# Patient Record
Sex: Male | Born: 1945
Health system: Southern US, Community
[De-identification: ages and names within clinical notes are randomized; demographics above are authoritative.]

## PROBLEM LIST (undated history)

## (undated) DIAGNOSIS — I10 Essential (primary) hypertension: Secondary | ICD-10-CM

## (undated) DIAGNOSIS — E039 Hypothyroidism, unspecified: Secondary | ICD-10-CM

## (undated) DIAGNOSIS — C61 Malignant neoplasm of prostate: Secondary | ICD-10-CM

## (undated) DIAGNOSIS — M1990 Unspecified osteoarthritis, unspecified site: Secondary | ICD-10-CM

## (undated) DIAGNOSIS — T7840XA Allergy, unspecified, initial encounter: Secondary | ICD-10-CM

## (undated) DIAGNOSIS — E079 Disorder of thyroid, unspecified: Secondary | ICD-10-CM

## (undated) DIAGNOSIS — E7800 Pure hypercholesterolemia, unspecified: Secondary | ICD-10-CM

## (undated) HISTORY — PX: ANKLE SURGERY: SHX546

## (undated) HISTORY — PX: HERNIA REPAIR: SHX51

## (undated) HISTORY — DX: Allergy, unspecified, initial encounter: T78.40XA

## (undated) HISTORY — DX: Hypothyroidism, unspecified: E03.9

## (undated) HISTORY — DX: Unspecified osteoarthritis, unspecified site: M19.90

## (undated) HISTORY — DX: Disorder of thyroid, unspecified: E07.9

## (undated) HISTORY — DX: Malignant neoplasm of prostate: C61

## (undated) HISTORY — PX: PROSTATECTOMY: SHX69

## (undated) HISTORY — PX: BACK SURGERY: SHX140

---

## 2010-10-15 ENCOUNTER — Ambulatory Visit (INDEPENDENT_AMBULATORY_CARE_PROVIDER_SITE_OTHER): Payer: Medicare PPO | Admitting: Endocrinology

## 2010-10-15 ENCOUNTER — Encounter: Payer: Self-pay | Admitting: Endocrinology

## 2010-10-15 DIAGNOSIS — E042 Nontoxic multinodular goiter: Secondary | ICD-10-CM

## 2010-10-15 DIAGNOSIS — E7800 Pure hypercholesterolemia, unspecified: Secondary | ICD-10-CM | POA: Insufficient documentation

## 2010-10-15 DIAGNOSIS — E0590 Thyrotoxicosis, unspecified without thyrotoxic crisis or storm: Secondary | ICD-10-CM

## 2010-10-15 DIAGNOSIS — M1990 Unspecified osteoarthritis, unspecified site: Secondary | ICD-10-CM

## 2010-10-15 DIAGNOSIS — E119 Type 2 diabetes mellitus without complications: Secondary | ICD-10-CM | POA: Insufficient documentation

## 2010-10-15 NOTE — Patient Instructions (Addendum)
Please sign a release of information for the thyroid ultrasound and thyroid blood test. Based on the results i will probably request for you a thyroid "scan" (a special, but easy and painless type of thyroid x ray).  It works like this: you go to the x-ray department of the hospital to swallow a pill, which contains a miniscule amount of radioactivity.  You will not notice any symptoms from this.  You will go back to the x-ray department the next day, to lie down in front of a camera.  The results of this will be sent to me.   Based on the results, i hope to order for you a treatment pill of radioactive iodine.  Although it is a larger amount of radioactivity, you will again notice no symptoms from this.  The pill is gone from your body in a few days (during which you should stay away from other people), but takes several months to work.  Therefore, please return here approximately 6 weeks after the treatment.  This treatment has been available for many years, and the only known side-effect is an underactive thyroid.  It is possible that i would eventually prescribe for you a thyroid hormone pill, which is very inexpensive.  You don't have to worry about side-effects of this thyroid hormone pill, because it is the same molecule your thyroid makes. (update:  i ordered scan)

## 2010-10-15 NOTE — Progress Notes (Signed)
  Subjective:    Patient ID: Duane Stevens, male    DOB: 1946-03-18, 65 y.o.   MRN: [id number]  HPI Pt had mri of the spine, and was incidentally noted to have a thyroid nodule.  Pt says he is unaware of ever having had a thyroid problem.  He has a few years of moderate pain at the neck, but no assoc swelling at the thyroid area. Past Medical History  Diagnosis Date  . Thyroid disease   . Prostate cancer     hx of  . Arthritis   . Diabetes mellitus   . Allergy     Past Surgical History  Procedure Date  . Hernia repair   . Back surgery   . Prostatectomy     History   Social History  . Marital Status: Married    Spouse Name: N/A    Number of Children: N/A  . Years of Education: N/A   Occupational History  . Retired    Social History Main Topics  . Smoking status: Former Games developer  . Smokeless tobacco: Not on file   Comment: quit 20-30 years ago  . Alcohol Use: No  . Drug Use: No  . Sexually Active: Not on file   Other Topics Concern  . Not on file   Social History Narrative  . No narrative on file  retired  No current outpatient prescriptions on file prior to visit.    Allergies  Allergen Reactions  . Bee Venom   . Oxycontin   . Penicillins     Family History  Problem Relation Age of Onset  . Cancer Other     FH of Lung Cancer  . Cancer Other     FH of Prostate Cancer  . Diabetes Other     Parent  . Heart disease Other     Parent  no goiter or other thyroid problem BP 104/64  Pulse 76  Temp(Src) 98.2 F (36.8 C) (Oral)  Ht 5\' 9"  (1.753 m)  Wt 206 lb (93.441 kg)  BMI 30.42 kg/m2  SpO2 97%    Review of Systems denies weight loss, headache, hoarseness, double vision, palpitations, sob, diarrhea, polyuria, myalgias, excessive diaphoresis, numbness, tremor, anxiety, hypoglycemia, and easy bruising.  He has chronic rhinorrhea.    Objective:   Physical Exam VS: see vs page GEN: no distress HEAD: head: no deformity eyes: no periorbital  swelling, no proptosis external nose and ears are normal mouth: no lesion seen NECK: there is an easily palpable, 2 cm right thyroid nodule CHEST WALL: no deformity CV: reg rate and rhythm, no murmur ABD: abdomen is soft, nontender.  no hepatosplenomegaly.  not distended.  no hernia MUSCULOSKELETAL: muscle bulk and strength are grossly normal.  no obvious joint swelling.  gait is normal and steady EXTEMITIES: no deformity.  no ulcer on the feet.  feet are of normal color and temp.  no edema PULSES: dorsalis pedis intact bilat.  no carotid bruit NEURO:  cn 2-12 grossly intact.   readily moves all 4's.  sensation is intact to touch on the feet SKIN:  Normal texture and temperature.  No rash or suspicious lesion is visible.   NODES:  None palpable at the neck. PSYCH: alert, oriented x3.  Does not appear anxious nor depressed.     Thyroid ultrasound: multinodular goiter, with dominant right nodule  Assessment & Plan:  Multinodular goiter, which is usually hereditary Mild hyperthyroidism, due to the goiter Neck pain, not related to the thyroid.

## 2010-11-04 ENCOUNTER — Telehealth: Payer: Self-pay | Admitting: *Deleted

## 2010-11-04 NOTE — Telephone Encounter (Signed)
Patient requesting results of thyroid scan.  LMOM that we are not showing results in our system as of yet but will clarify with MD tomorrow.

## 2010-11-05 NOTE — Telephone Encounter (Signed)
i called pt 11/05/10.  Come in for bx

## 2010-11-11 ENCOUNTER — Encounter: Payer: Self-pay | Admitting: Endocrinology

## 2010-11-19 ENCOUNTER — Ambulatory Visit (INDEPENDENT_AMBULATORY_CARE_PROVIDER_SITE_OTHER): Payer: Medicare PPO | Admitting: Endocrinology

## 2010-11-19 ENCOUNTER — Encounter: Payer: Self-pay | Admitting: Endocrinology

## 2010-11-19 ENCOUNTER — Other Ambulatory Visit (HOSPITAL_COMMUNITY)
Admission: RE | Admit: 2010-11-19 | Discharge: 2010-11-19 | Disposition: A | Discharge status: Home / Self Care | Payer: Medicare PPO | Source: Ambulatory Visit | Attending: Endocrinology | Admitting: Endocrinology

## 2010-11-19 VITALS — BP 122/74 | HR 77 | Temp 97.9°F | Ht 69.0 in | Wt 205.0 lb

## 2010-11-19 DIAGNOSIS — E041 Nontoxic single thyroid nodule: Secondary | ICD-10-CM | POA: Insufficient documentation

## 2010-11-19 DIAGNOSIS — E042 Nontoxic multinodular goiter: Secondary | ICD-10-CM

## 2010-11-19 NOTE — Patient Instructions (Addendum)
please call [phone] to hear your biopsy result.  It will be available in 2-7 days.  You will be prompted to enter the 9-digit "MRN" number that appears at the top left of this page, followed by #.  Then you will hear the message. If the result is benign (as expected), i'll order the radioactive iodine treamtment for you. Please return here about 6-8 weeks later.

## 2010-11-23 NOTE — Progress Notes (Signed)
  Subjective:    Patient ID: Duane Stevens, male    DOB: 10/27/1945, 65 y.o.   MRN: [id number]  HPI thyroid needle bx: consent obtained, signed form on chart local: xylocaine 2% prep: betadine 3 bxs are done with 27g needles no complications    Review of Systems     Objective:   Physical Exam        Assessment & Plan:

## 2010-12-12 ENCOUNTER — Telehealth: Payer: Self-pay

## 2010-12-12 DIAGNOSIS — E0590 Thyrotoxicosis, unspecified without thyrotoxic crisis or storm: Secondary | ICD-10-CM

## 2010-12-12 NOTE — Telephone Encounter (Signed)
Pt advised that [facility] will call regarding therapy and he is to return to clinic 6 weeks after treatment.

## 2010-12-12 NOTE — Telephone Encounter (Signed)
Pt called stating that since the pathology results of his thyroid came back normal he has not heard back form our office. Pt states he was advise that he would be be schedule for radioactive iodine but has yet to be contacted, please advise.

## 2010-12-12 NOTE — Telephone Encounter (Signed)
i ordered Ret 6 weeks later 

## 2011-01-01 ENCOUNTER — Other Ambulatory Visit: Payer: Self-pay | Admitting: Endocrinology

## 2011-01-01 DIAGNOSIS — E0590 Thyrotoxicosis, unspecified without thyrotoxic crisis or storm: Secondary | ICD-10-CM

## 2011-01-21 ENCOUNTER — Encounter: Payer: Self-pay | Admitting: Endocrinology

## 2011-02-24 ENCOUNTER — Encounter: Payer: Self-pay | Admitting: Endocrinology

## 2011-02-24 ENCOUNTER — Ambulatory Visit (INDEPENDENT_AMBULATORY_CARE_PROVIDER_SITE_OTHER): Payer: Medicare PPO | Admitting: Endocrinology

## 2011-02-24 ENCOUNTER — Other Ambulatory Visit (INDEPENDENT_AMBULATORY_CARE_PROVIDER_SITE_OTHER): Payer: Medicare PPO

## 2011-02-24 VITALS — BP 118/70 | HR 96 | Temp 98.1°F | Ht 68.0 in | Wt 203.0 lb

## 2011-02-24 DIAGNOSIS — E0590 Thyrotoxicosis, unspecified without thyrotoxic crisis or storm: Secondary | ICD-10-CM

## 2011-02-24 LAB — TSH: TSH: 0.24 u[IU]/mL — ABNORMAL LOW (ref 0.35–5.50)

## 2011-02-24 NOTE — Progress Notes (Signed)
  Subjective:    Patient ID: Duane Stevens, male    DOB: March 19, 1946, 65 y.o.   MRN: [id number]  HPI Pt is 6 weeks /p i-131 rx, due to multinodular goiter.  pt states he feels no different, and well in general. Past Medical History  Diagnosis Date  . Thyroid disease   . Prostate cancer     hx of  . Arthritis   . Diabetes mellitus   . Allergy     Past Surgical History  Procedure Date  . Hernia repair   . Back surgery   . Prostatectomy     History   Social History  . Marital Status: Married    Spouse Name: N/A    Number of Children: N/A  . Years of Education: N/A   Occupational History  . Retired    Social History Main Topics  . Smoking status: Former Games developer  . Smokeless tobacco: Not on file   Comment: quit 20-30 years ago  . Alcohol Use: No  . Drug Use: No  . Sexually Active: Not on file   Other Topics Concern  . Not on file   Social History Narrative  . No narrative on file    Current Outpatient Prescriptions on File Prior to Visit  Medication Sig Dispense Refill  . aspirin 81 MG tablet Take 81 mg by mouth daily.        . Cholecalciferol (VITAMIN D3) 1000 UNITS CAPS Take 1 capsule by mouth daily.        . ETODOLAC PO Take 1 tablet by mouth 2 (two) times daily as needed.        . Glucosamine-Chondroitin-Vit D3 1500-1200-800 MG-MG-UNIT PACK Take 1 capsule by mouth 2 (two) times daily.        . metFORMIN (GLUCOPHAGE-XR) 500 MG 24 hr tablet Take 500 mg by mouth 2 (two) times daily.        . simvastatin (ZOCOR) 20 MG tablet Take 20 mg by mouth daily.          Allergies  Allergen Reactions  . Bee Venom   . Oxycontin   . Penicillins     Family History  Problem Relation Age of Onset  . Cancer Other     FH of Lung Cancer  . Cancer Other     FH of Prostate Cancer  . Diabetes Other     Parent  . Heart disease Other     Parent    BP 118/70  Pulse 96  Temp(Src) 98.1 F (36.7 C) (Oral)  Ht 5\' 8"  (1.727 m)  Wt 203 lb (92.08 kg)  BMI 30.87 kg/m2   SpO2 95%   Review of Systems Denies weight change    Objective:   Physical Exam VITAL SIGNS:  See vs page GENERAL: no distress Neck: no change in the 2 cm right thyroid thyroid nodule.     Lab Results  Component Value Date   TSH 0.24* 02/24/2011      Assessment & Plan:  Hyperthyroidism due to multinodular goiter, not better yet. Multinodular goiter, not better yet.

## 2011-02-24 NOTE — Patient Instructions (Addendum)
blood tests are being requested for you today.  please call [phone] to hear your test results.  You will be prompted to enter the 9-digit "MRN" number that appears at the top left of this page, followed by #.  Then you will hear the message. Please see dr burdine in approx 6 weeks, to recheck the thyroid. Please come back for a follow-up appointment here in approx 4 months. (update: i left message on phone-tree:  Not better yet).

## 2011-05-08 ENCOUNTER — Other Ambulatory Visit (INDEPENDENT_AMBULATORY_CARE_PROVIDER_SITE_OTHER): Payer: Medicare Other

## 2011-05-08 ENCOUNTER — Encounter: Payer: Self-pay | Admitting: Endocrinology

## 2011-05-08 ENCOUNTER — Ambulatory Visit (INDEPENDENT_AMBULATORY_CARE_PROVIDER_SITE_OTHER): Payer: Medicare Other | Admitting: Endocrinology

## 2011-05-08 VITALS — BP 128/74 | HR 85 | Temp 97.3°F | Ht 68.0 in | Wt 206.7 lb

## 2011-05-08 DIAGNOSIS — E0590 Thyrotoxicosis, unspecified without thyrotoxic crisis or storm: Secondary | ICD-10-CM

## 2011-05-08 NOTE — Progress Notes (Signed)
  Subjective:    Patient ID: Duane Stevens, male    DOB: Aug 16, 1945, 66 y.o.   MRN: [id number]  HPI  Pt is 3 1/2 months /p i-131 rx, due to multinodular goiter.  pt states he feels well, except for fatigue. Past Medical History  Diagnosis Date  . Thyroid disease   . Prostate cancer     hx of  . Arthritis   . Diabetes mellitus   . Allergy     Past Surgical History  Procedure Date  . Hernia repair   . Back surgery   . Prostatectomy     History   Social History  . Marital Status: Married    Spouse Name: N/A    Number of Children: N/A  . Years of Education: N/A   Occupational History  . Retired    Social History Main Topics  . Smoking status: Former Games developer  . Smokeless tobacco: Not on file   Comment: quit 20-30 years ago  . Alcohol Use: No  . Drug Use: No  . Sexually Active: Not on file   Other Topics Concern  . Not on file   Social History Narrative  . No narrative on file    Current Outpatient Prescriptions on File Prior to Visit  Medication Sig Dispense Refill  . aspirin 81 MG tablet Take 81 mg by mouth daily.        . Cholecalciferol (VITAMIN D3) 1000 UNITS CAPS Take 1 capsule by mouth daily.        . ETODOLAC PO Take 1 tablet by mouth 2 (two) times daily as needed.        . Glucosamine-Chondroitin-Vit D3 1500-1200-800 MG-MG-UNIT PACK Take 1 capsule by mouth 2 (two) times daily.        . metFORMIN (GLUCOPHAGE-XR) 500 MG 24 hr tablet Take 500 mg by mouth 2 (two) times daily.        . simvastatin (ZOCOR) 20 MG tablet Take 20 mg by mouth daily.          Allergies  Allergen Reactions  . Bee Venom   . Oxycontin   . Penicillins     Family History  Problem Relation Age of Onset  . Cancer Other     FH of Lung Cancer  . Cancer Other     FH of Prostate Cancer  . Diabetes Other     Parent  . Heart disease Other     Parent   There were no vitals taken for this visit.  Review of Systems Denies weight change.    Objective:   Physical Exam VITAL  SIGNS:  See vs page GENERAL: no distress Neck: no change in the 2 cm right thyroid thyroid nodule.    Lab Results  Component Value Date   TSH 0.24* 02/24/2011      Assessment & Plan:  Hyperthyroidism due to multinodular goiter, not better yet. Multinodular goiter, not better yet.

## 2011-05-08 NOTE — Patient Instructions (Addendum)
blood tests are being requested for you today.  please call [phone] to hear your test results.  You will be prompted to enter the 9-digit "MRN" number that appears at the top left of this page, followed by #.  Then you will hear the message. Please come back for a follow-up appointment in 3 months. (update: i left message on phone-tree:  rx as we discussed).

## 2011-08-07 ENCOUNTER — Other Ambulatory Visit (INDEPENDENT_AMBULATORY_CARE_PROVIDER_SITE_OTHER): Payer: Medicare Other

## 2011-08-07 ENCOUNTER — Encounter: Payer: Self-pay | Admitting: Endocrinology

## 2011-08-07 ENCOUNTER — Ambulatory Visit (INDEPENDENT_AMBULATORY_CARE_PROVIDER_SITE_OTHER): Payer: Medicare Other | Admitting: Endocrinology

## 2011-08-07 VITALS — BP 122/70 | HR 84 | Temp 97.4°F | Ht 68.0 in | Wt 203.0 lb

## 2011-08-07 DIAGNOSIS — E0590 Thyrotoxicosis, unspecified without thyrotoxic crisis or storm: Secondary | ICD-10-CM

## 2011-08-07 LAB — TSH: TSH: 3.94 u[IU]/mL (ref 0.35–5.50)

## 2011-08-07 NOTE — Patient Instructions (Addendum)
blood tests are being requested for you today.  You will receive a letter with results. Please come back for a follow-up appointment in 1 year. 

## 2011-08-07 NOTE — Progress Notes (Signed)
  Subjective:    Patient ID: Duane Stevens, male    DOB: 19-Feb-1946, 66 y.o.   MRN: [id number]  HPI Pt is 6 1/2 months s/p i-131 rx, due to multinodular goiter.  pt states he feels well, except for drowsiness. Past Medical History  Diagnosis Date  . Thyroid disease   . Prostate cancer     hx of  . Arthritis   . Diabetes mellitus   . Allergy     Past Surgical History  Procedure Date  . Hernia repair   . Back surgery   . Prostatectomy     History   Social History  . Marital Status: Married    Spouse Name: N/A    Number of Children: N/A  . Years of Education: N/A   Occupational History  . Retired    Social History Main Topics  . Smoking status: Former Games developer  . Smokeless tobacco: Not on file   Comment: quit 20-30 years ago  . Alcohol Use: No  . Drug Use: No  . Sexually Active: Not on file   Other Topics Concern  . Not on file   Social History Narrative  . No narrative on file    Current Outpatient Prescriptions on File Prior to Visit  Medication Sig Dispense Refill  . aspirin 81 MG tablet Take 81 mg by mouth daily.        . Cholecalciferol (VITAMIN D3) 1000 UNITS CAPS Take 1 capsule by mouth daily.        . ETODOLAC PO Take 0.5 tablets by mouth 2 (two) times daily as needed.       . Glucosamine-Chondroitin-Vit D3 1500-1200-800 MG-MG-UNIT PACK Take 1 capsule by mouth 2 (two) times daily.        . metFORMIN (GLUCOPHAGE-XR) 500 MG 24 hr tablet Take 500 mg by mouth 2 (two) times daily.        . simvastatin (ZOCOR) 20 MG tablet Take 20 mg by mouth daily.          Allergies  Allergen Reactions  . Bee Venom   . Oxycontin   . Penicillins     Family History  Problem Relation Age of Onset  . Cancer Other     FH of Lung Cancer  . Cancer Other     FH of Prostate Cancer  . Diabetes Other     Parent  . Heart disease Other     Parent    BP 122/70  Pulse 84  Temp(Src) 97.4 F (36.3 C) (Oral)  Ht 5\' 8"  (1.727 m)  Wt 203 lb (92.08 kg)  BMI 30.87 kg/m2   SpO2 97%    Review of Systems Denies weight gain    Objective:   Physical Exam VITAL SIGNS:  See vs page GENERAL: no distress Neck: no change in the 2 cm right thyroid thyroid nodule.     Lab Results  Component Value Date   TSH 3.94 08/07/2011      Assessment & Plan:  Hyperthyroidism, resolved with i-131 rx Multinodular goiter, clinically unchanged

## 2011-08-08 ENCOUNTER — Telehealth: Payer: Self-pay | Admitting: *Deleted

## 2011-08-08 NOTE — Telephone Encounter (Signed)
Called pt to inform of thyroid results, pt informed (Letter also mailed to pt).

## 2012-04-07 ENCOUNTER — Ambulatory Visit (INDEPENDENT_AMBULATORY_CARE_PROVIDER_SITE_OTHER): Payer: Medicare Other | Admitting: Endocrinology

## 2012-04-07 ENCOUNTER — Encounter: Payer: Self-pay | Admitting: Endocrinology

## 2012-04-07 VITALS — BP 142/78 | HR 89 | Temp 98.6°F | Wt 200.0 lb

## 2012-04-07 DIAGNOSIS — E0590 Thyrotoxicosis, unspecified without thyrotoxic crisis or storm: Secondary | ICD-10-CM

## 2012-04-07 NOTE — Patient Instructions (Addendum)
blood tests are being requested for you today.  We'll contact you with results. Please return in 1 year. most of the time, a "lumpy thyroid" will eventually become overactive again.  this is usually a slow process, which could take many years.

## 2012-04-07 NOTE — Progress Notes (Signed)
  Subjective:    Patient ID: Duane Stevens, male    DOB: Aug 07, 1945, 66 y.o.   MRN: [id number]  HPI Pt is 14 months s/p i-131 rx, due to multinodular goiter.  He developed euthyroidism, on no rx.  pt states he feels well in general.   Past Medical History  Diagnosis Date  . Thyroid disease   . Prostate cancer     hx of  . Arthritis   . Diabetes mellitus   . Allergy     Past Surgical History  Procedure Date  . Hernia repair   . Back surgery   . Prostatectomy     History   Social History  . Marital Status: Married    Spouse Name: N/A    Number of Children: N/A  . Years of Education: N/A   Occupational History  . Retired    Social History Main Topics  . Smoking status: Former Games developer  . Smokeless tobacco: Not on file     Comment: quit 20-30 years ago  . Alcohol Use: No  . Drug Use: No  . Sexually Active: Not on file   Other Topics Concern  . Not on file   Social History Narrative  . No narrative on file    Current Outpatient Prescriptions on File Prior to Visit  Medication Sig Dispense Refill  . aspirin 81 MG tablet Take 81 mg by mouth daily.        . Cholecalciferol (VITAMIN D3) 1000 UNITS CAPS Take 1 capsule by mouth daily.        . ETODOLAC PO Take 0.5 tablets by mouth 2 (two) times daily as needed.       . Glucosamine-Chondroitin-Vit D3 1500-1200-800 MG-MG-UNIT PACK Take 1 capsule by mouth 2 (two) times daily.        . metFORMIN (GLUCOPHAGE-XR) 500 MG 24 hr tablet Take 500 mg by mouth 2 (two) times daily.        . simvastatin (ZOCOR) 20 MG tablet Take 20 mg by mouth daily.          Allergies  Allergen Reactions  . Bee Venom   . Oxycodone Hcl Er   . Penicillins     Family History  Problem Relation Age of Onset  . Cancer Other     FH of Lung Cancer  . Cancer Other     FH of Prostate Cancer  . Diabetes Other     Parent  . Heart disease Other     Parent    BP 142/78  Pulse 89  Temp 98.6 F (37 C) (Oral)  Wt 200 lb (90.719 kg)  SpO2  97%  Review of Systems Denies weight gain    Objective:   Physical Exam VITAL SIGNS:  See vs page GENERAL: no distress Neck: no change in the 2 cm right thyroid thyroid nodule.    Lab Results  Component Value Date   TSH 3.226 04/07/2012   Assessment & Plan:  Hyperthyroidism, resolved with i-131 rx. However, he is at risk for recurrence of hyperthyroidism Multinodular goiter, clinically unchanged

## 2013-09-21 ENCOUNTER — Ambulatory Visit (INDEPENDENT_AMBULATORY_CARE_PROVIDER_SITE_OTHER): Payer: Medicare HMO | Admitting: Endocrinology

## 2013-09-21 ENCOUNTER — Encounter: Payer: Self-pay | Admitting: Endocrinology

## 2013-09-21 VITALS — BP 120/70 | HR 82 | Temp 98.1°F | Ht 68.0 in | Wt 210.0 lb

## 2013-09-21 DIAGNOSIS — E042 Nontoxic multinodular goiter: Secondary | ICD-10-CM

## 2013-09-21 NOTE — Patient Instructions (Signed)
This is a borderline call, between taking a small thyroid hormone pill, or just rechecking the blood test in 3-6 months.   For now, let's plan to just have it rechecked at Dr Burdine's office in 3-6 months.   most of the time, a "lumpy thyroid" will eventually become overactive again.  this is usually a slow process, happening over the span of many years. Please come back for a follow-up appointment in 6 months.

## 2013-09-21 NOTE — Progress Notes (Signed)
Subjective:    Patient ID: Duane Stevens, male    DOB: December 24, 1945, 68 y.o.   MRN: [id number]  HPI Pt returns for f/u of multinodular goiter (he had I-131 rx for associated hyperthyroidism in 2012; [place] prior to rx showed all nodules < 10 mm; he developed euthyroidism, on no rx).   pt states he feels well in general.  He does not notice the goiter.   Past Medical History  Diagnosis Date  . Thyroid disease   . Prostate cancer     hx of  . Arthritis   . Diabetes mellitus   . Allergy     Past Surgical History  Procedure Laterality Date  . Hernia repair    . Back surgery    . Prostatectomy      History   Social History  . Marital Status: Married    Spouse Name: N/A    Number of Children: N/A  . Years of Education: N/A   Occupational History  . Retired    Social History Main Topics  . Smoking status: Former Research scientist (life sciences)  . Smokeless tobacco: Not on file     Comment: quit 20-30 years ago  . Alcohol Use: No  . Drug Use: No  . Sexual Activity: Not on file   Other Topics Concern  . Not on file   Social History Narrative  . No narrative on file    Current Outpatient Prescriptions on File Prior to Visit  Medication Sig Dispense Refill  . aspirin 81 MG tablet Take 81 mg by mouth daily.        . Cholecalciferol (VITAMIN D3) 1000 UNITS CAPS Take 1 capsule by mouth daily.        . ETODOLAC PO Take 0.5 tablets by mouth 2 (two) times daily as needed.       . Glucosamine-Chondroitin-Vit D3 1500-1200-800 MG-MG-UNIT PACK Take 1 capsule by mouth 2 (two) times daily.        . metFORMIN (GLUCOPHAGE-XR) 500 MG 24 hr tablet Take 500 mg by mouth 2 (two) times daily.        . simvastatin (ZOCOR) 20 MG tablet Take 20 mg by mouth daily.         No current facility-administered medications on file prior to visit.    Allergies  Allergen Reactions  . Bee Venom   . Oxycodone Hcl   . Penicillins     Family History  Problem Relation Age of Onset  . Cancer Other     FH of Lung Cancer    . Cancer Other     FH of Prostate Cancer  . Diabetes Other     Parent  . Heart disease Other     Parent    BP 120/70  Pulse 82  Temp(Src) 98.1 F (36.7 C) (Oral)  Ht 5\' 8"  (1.727 m)  Wt 210 lb (95.255 kg)  BMI 31.94 kg/m2  SpO2 92%  Review of Systems Denies weight change and dysphagia.      Objective:   Physical Exam VITAL SIGNS:  See vs page.   GENERAL: no distress.  Neck: 2 cm right thyroid thyroid nodule is again noted.     outside test results are reviewed: TSH=7 i reviewed the [place] report from [facility] in 2012.     Assessment & Plan:  Multinodular goiter: clinically unchanged.   Pt is advised to have a repeat US.   Post-I-131 hypothyroidism, new.   Patient is advised the following: Patient Instructions  This  is a borderline call, between taking a small thyroid hormone pill, or just rechecking the blood test in 3-6 months.   For now, let's plan to just have it rechecked at Dr Burdine's office in 3-6 months.   most of the time, a "lumpy thyroid" will eventually become overactive again.  this is usually a slow process, happening over the span of many years. Please come back for a follow-up appointment in 6 months.

## 2013-09-22 ENCOUNTER — Telehealth: Payer: Self-pay | Admitting: Endocrinology

## 2013-09-22 NOTE — Telephone Encounter (Signed)
please call patient: i reviewed your records.  Is it ok to do a follow-up ultrasound? i have ordered, to be done in [place].

## 2013-09-23 NOTE — Telephone Encounter (Signed)
Noted, pt's wife informed.

## 2013-10-05 ENCOUNTER — Ambulatory Visit
Admission: RE | Admit: 2013-10-05 | Discharge: 2013-10-05 | Disposition: A | Discharge status: Home / Self Care | Payer: Medicare HMO | Source: Ambulatory Visit | Attending: Endocrinology | Admitting: Endocrinology

## 2013-10-05 DIAGNOSIS — E042 Nontoxic multinodular goiter: Secondary | ICD-10-CM

## 2013-10-05 NOTE — Progress Notes (Signed)
   Subjective:    Patient ID: Duane Stevens, male    DOB: 1945-09-07, 68 y.o.   MRN: [id number]  HPI Correction: [place] prior to I-131 rx showed 3.5 cm right lobe mass, but all other nodules were small.   Review of Systems     Objective:   Physical Exam        Assessment & Plan:

## 2013-11-07 ENCOUNTER — Telehealth: Payer: Self-pay

## 2013-11-07 NOTE — Telephone Encounter (Signed)
Ov next available please

## 2013-11-08 NOTE — Telephone Encounter (Signed)
Pt scheduled for 12/01/2013.

## 2013-12-01 ENCOUNTER — Encounter: Payer: Self-pay | Admitting: Endocrinology

## 2013-12-01 ENCOUNTER — Ambulatory Visit (INDEPENDENT_AMBULATORY_CARE_PROVIDER_SITE_OTHER): Payer: Medicare HMO | Admitting: Endocrinology

## 2013-12-01 VITALS — BP 126/74 | HR 77 | Temp 97.7°F | Ht 68.0 in | Wt 205.0 lb

## 2013-12-01 DIAGNOSIS — E042 Nontoxic multinodular goiter: Secondary | ICD-10-CM

## 2013-12-01 LAB — T4, FREE: FREE T4: 0.6 ng/dL (ref 0.60–1.60)

## 2013-12-01 LAB — TSH: TSH: 3.4 u[IU]/mL (ref 0.35–4.50)

## 2013-12-01 NOTE — Patient Instructions (Addendum)
blood tests are being requested for you today.  We'll contact you with results.    most of the time, a "lumpy thyroid" will eventually become overactive again.  this is usually a slow process, happening over the span of many years. Please come back for a follow-up appointment in 6 months.

## 2013-12-01 NOTE — Progress Notes (Signed)
Subjective:    Patient ID: Duane Stevens, male    DOB: 03-Jan-1946, 68 y.o.   MRN: [id number]  HPI Pt returns for f/u of multinodular goiter (he had I-131 rx for associated hyperthyroidism in 2012; [place] prior to rx showed all nodules < 10 mm; he developed euthyroidism, on no rx).   pt states he feels well in general.  He does not notice the goiter.  In early 2015, TSH was slightly high.   Past Medical History  Diagnosis Date  . Thyroid disease   . Prostate cancer     hx of  . Arthritis   . Diabetes mellitus   . Allergy     Past Surgical History  Procedure Laterality Date  . Hernia repair    . Back surgery    . Prostatectomy      History   Social History  . Marital Status: Married    Spouse Name: N/A    Number of Children: N/A  . Years of Education: N/A   Occupational History  . Retired    Social History Main Topics  . Smoking status: Former Research scientist (life sciences)  . Smokeless tobacco: Not on file     Comment: quit 20-30 years ago  . Alcohol Use: No  . Drug Use: No  . Sexual Activity: Not on file   Other Topics Concern  . Not on file   Social History Narrative  . No narrative on file    Current Outpatient Prescriptions on File Prior to Visit  Medication Sig Dispense Refill  . aspirin 81 MG tablet Take 81 mg by mouth daily.        . Cholecalciferol (VITAMIN D3) 1000 UNITS CAPS Take 1 capsule by mouth daily.        . ETODOLAC PO Take 0.5 tablets by mouth 2 (two) times daily as needed.       Marland Kitchen glipiZIDE (GLUCOTROL) 10 MG tablet       . Glucosamine-Chondroitin-Vit D3 1500-1200-800 MG-MG-UNIT PACK Take 1 capsule by mouth 2 (two) times daily.        Marland Kitchen losartan (COZAAR) 25 MG tablet       . metFORMIN (GLUCOPHAGE-XR) 500 MG 24 hr tablet Take 500 mg by mouth 2 (two) times daily.        . simvastatin (ZOCOR) 20 MG tablet Take 20 mg by mouth daily.         No current facility-administered medications on file prior to visit.    Allergies  Allergen Reactions  . Bee Venom   .  Oxycodone Hcl   . Penicillins     Family History  Problem Relation Age of Onset  . Cancer Other     FH of Lung Cancer  . Cancer Other     FH of Prostate Cancer  . Diabetes Other     Parent  . Heart disease Other     Parent    BP 126/74  Pulse 77  Temp(Src) 97.7 F (36.5 C) (Oral)  Ht 5\' 8"  (1.727 m)  Wt 205 lb (92.987 kg)  BMI 31.18 kg/m2  SpO2 98%   Review of Systems Denies weight change.    Objective:   Physical Exam VITAL SIGNS:  See vs page GENERAL: no distress Neck: thyroid is normal, except for slight right lobe enlargement.  No palpable nodule.   Lab Results  Component Value Date   TSH 3.40 12/01/2013       Assessment & Plan:  Mild hypothyroidism, spontaneously resolved.  However,  he is at risk for recurrent abnormal abnormal thyroid function. Multinodular goiter, clinically unchanged.   Patient is advised the following: Patient Instructions  blood tests are being requested for you today.  We'll contact you with results.    most of the time, a "lumpy thyroid" will eventually become overactive again.  this is usually a slow process, happening over the span of many years. Please come back for a follow-up appointment in 6 months.

## 2014-06-05 ENCOUNTER — Ambulatory Visit: Payer: Medicare HMO | Admitting: Endocrinology

## 2014-06-06 ENCOUNTER — Ambulatory Visit: Payer: Medicare HMO | Admitting: Endocrinology

## 2014-06-26 ENCOUNTER — Ambulatory Visit (INDEPENDENT_AMBULATORY_CARE_PROVIDER_SITE_OTHER): Payer: Medicare HMO | Admitting: Endocrinology

## 2014-06-26 VITALS — BP 118/78 | HR 94 | Temp 98.1°F | Wt 206.2 lb

## 2014-06-26 DIAGNOSIS — E0590 Thyrotoxicosis, unspecified without thyrotoxic crisis or storm: Secondary | ICD-10-CM

## 2014-06-26 LAB — TSH: TSH: 5.64 u[IU]/mL — ABNORMAL HIGH (ref 0.35–4.50)

## 2014-06-26 NOTE — Progress Notes (Signed)
Pre visit review using our clinic review tool, if applicable. No additional management support is needed unless otherwise documented below in the visit note. 

## 2014-06-26 NOTE — Patient Instructions (Addendum)
blood tests are being requested for you today.  We'll contact you with results.    most of the time, a "lumpy thyroid" will eventually become overactive again.  this is usually a slow process, happening over the span of many years. Please come back for a follow-up appointment in 1 year.

## 2014-06-26 NOTE — Progress Notes (Signed)
Subjective:    Patient ID: Duane Stevens, male    DOB: [birth year], 69 y.o.   MRN: [id number]  HPI Pt returns for f/u of multinodular goiter (he had I-131 rx for associated hyperthyroidism in 2012; he developed euthyroidism, on no rx; f/u US in mid-2015 was unchanged ).   pt states he feels well in general.  He does not notice the goiter.   Past Medical History  Diagnosis Date  . Thyroid disease   . Prostate cancer     hx of  . Arthritis   . Diabetes mellitus   . Allergy     Past Surgical History  Procedure Laterality Date  . Hernia repair    . Back surgery    . Prostatectomy      History   Social History  . Marital Status: Married    Spouse Name: N/A  . Number of Children: N/A  . Years of Education: N/A   Occupational History  . Retired    Social History Main Topics  . Smoking status: Former Research scientist (life sciences)  . Smokeless tobacco: Not on file     Comment: quit 20-30 years ago  . Alcohol Use: No  . Drug Use: No  . Sexual Activity: Not on file   Other Topics Concern  . Not on file   Social History Narrative  . No narrative on file    Current Outpatient Prescriptions on File Prior to Visit  Medication Sig Dispense Refill  . aspirin 81 MG tablet Take 81 mg by mouth daily.      . Cholecalciferol (VITAMIN D3) 1000 UNITS CAPS Take 1 capsule by mouth daily.      . ETODOLAC PO Take 0.5 tablets by mouth 2 (two) times daily as needed.     Marland Kitchen glipiZIDE (GLUCOTROL) 10 MG tablet     . Glucosamine-Chondroitin-Vit D3 1500-1200-800 MG-MG-UNIT PACK Take 1 capsule by mouth 2 (two) times daily.      Marland Kitchen losartan (COZAAR) 25 MG tablet     . metFORMIN (GLUCOPHAGE-XR) 500 MG 24 hr tablet Take 500 mg by mouth 2 (two) times daily.      . simvastatin (ZOCOR) 20 MG tablet Take 20 mg by mouth daily.       No current facility-administered medications on file prior to visit.    Allergies  Allergen Reactions  . Bee Venom   . Oxycodone Hcl   . Penicillins     Family History  Problem  Relation Age of Onset  . Cancer Other     FH of Lung Cancer  . Cancer Other     FH of Prostate Cancer  . Diabetes Other     Parent  . Heart disease Other     Parent    BP 118/78 mmHg  Pulse 94  Temp(Src) 98.1 F (36.7 C) (Oral)  Wt 206 lb 3.2 oz (93.532 kg)  SpO2 97%  Review of Systems He has gained a few lbs.    Objective:   Physical Exam VITAL SIGNS:  See vs page.  GENERAL: no distress. Neck: right thyroid mass is again noted.  No palpable nodule.   Lab Results  Component Value Date   TSH 5.64* 06/26/2014       Assessment & Plan:  Post-I-131 hypothyroidism: slightly worse   Patient is advised the following: Patient Instructions  blood tests are being requested for you today.  We'll contact you with results.    most of the time, a "lumpy thyroid" will eventually become  overactive again.  this is usually a slow process, happening over the span of many years. Please come back for a follow-up appointment in 1 year.

## 2015-07-23 DIAGNOSIS — E049 Nontoxic goiter, unspecified: Secondary | ICD-10-CM | POA: Diagnosis not present

## 2015-07-23 DIAGNOSIS — E782 Mixed hyperlipidemia: Secondary | ICD-10-CM | POA: Diagnosis not present

## 2015-07-23 DIAGNOSIS — M1991 Primary osteoarthritis, unspecified site: Secondary | ICD-10-CM | POA: Diagnosis not present

## 2015-07-23 DIAGNOSIS — E1165 Type 2 diabetes mellitus with hyperglycemia: Secondary | ICD-10-CM | POA: Diagnosis not present

## 2015-07-30 DIAGNOSIS — E1165 Type 2 diabetes mellitus with hyperglycemia: Secondary | ICD-10-CM | POA: Diagnosis not present

## 2015-07-30 DIAGNOSIS — M1711 Unilateral primary osteoarthritis, right knee: Secondary | ICD-10-CM | POA: Diagnosis not present

## 2015-07-30 DIAGNOSIS — E782 Mixed hyperlipidemia: Secondary | ICD-10-CM | POA: Diagnosis not present

## 2015-07-30 DIAGNOSIS — M19072 Primary osteoarthritis, left ankle and foot: Secondary | ICD-10-CM | POA: Diagnosis not present

## 2015-07-31 DIAGNOSIS — I739 Peripheral vascular disease, unspecified: Secondary | ICD-10-CM | POA: Diagnosis not present

## 2015-07-31 DIAGNOSIS — I998 Other disorder of circulatory system: Secondary | ICD-10-CM | POA: Diagnosis not present

## 2015-07-31 DIAGNOSIS — E119 Type 2 diabetes mellitus without complications: Secondary | ICD-10-CM | POA: Diagnosis not present

## 2015-08-08 ENCOUNTER — Encounter: Payer: Self-pay | Admitting: Endocrinology

## 2015-08-08 ENCOUNTER — Ambulatory Visit (INDEPENDENT_AMBULATORY_CARE_PROVIDER_SITE_OTHER): Payer: Medicare HMO | Admitting: Endocrinology

## 2015-08-08 VITALS — BP 112/68 | HR 80 | Temp 98.1°F | Ht 68.0 in | Wt 193.0 lb

## 2015-08-08 DIAGNOSIS — E042 Nontoxic multinodular goiter: Secondary | ICD-10-CM | POA: Diagnosis not present

## 2015-08-08 MED ORDER — LEVOTHYROXINE SODIUM 50 MCG PO TABS: 50.0000 ug | ORAL_TABLET | Freq: Every day | ORAL | Status: DC

## 2015-08-08 NOTE — Progress Notes (Signed)
Subjective:    Patient ID: Duane Stevens, male    DOB: [birth year], 70 y.o.   MRN: [id number]  HPI Pt returns for f/u of multinodular goiter (he had I-131 rx for associated hyperthyroidism in 2012; he developed euthyroidism, on no rx; f/u US in mid-2015 was unchanged ).   pt states he feels well in general.  He does not notice the goiter.    Past Medical History  Diagnosis Date  . Thyroid disease   . Prostate cancer ([place])     hx of  . Arthritis   . Diabetes mellitus   . Allergy     Past Surgical History  Procedure Laterality Date  . Hernia repair    . Back surgery    . Prostatectomy      Social History   Social History  . Marital Status: Married    Spouse Name: N/A  . Number of Children: N/A  . Years of Education: N/A   Occupational History  . Retired    Social History Main Topics  . Smoking status: Former Research scientist (life sciences)  . Smokeless tobacco: Not on file     Comment: quit 20-30 years ago  . Alcohol Use: No  . Drug Use: No  . Sexual Activity: Not on file   Other Topics Concern  . Not on file   Social History Narrative    Current Outpatient Prescriptions on File Prior to Visit  Medication Sig Dispense Refill  . aspirin 81 MG tablet Take 81 mg by mouth daily.      . Cholecalciferol (VITAMIN D3) 1000 UNITS CAPS Take 1 capsule by mouth daily.      . ETODOLAC PO Take 0.5 tablets by mouth 2 (two) times daily as needed.     Marland Kitchen glipiZIDE (GLUCOTROL) 10 MG tablet     . Glucosamine-Chondroitin-Vit D3 1500-1200-800 MG-MG-UNIT PACK Take 1 capsule by mouth 2 (two) times daily.      Marland Kitchen losartan (COZAAR) 25 MG tablet     . metFORMIN (GLUCOPHAGE-XR) 500 MG 24 hr tablet Take 500 mg by mouth. Take 2 tabs twice a day.    . simvastatin (ZOCOR) 20 MG tablet Take 20 mg by mouth daily.       No current facility-administered medications on file prior to visit.    Allergies  Allergen Reactions  . Bee Venom   . Oxycodone Hcl   . Penicillins     Family History  Problem Relation Age  of Onset  . Cancer Other     FH of Lung Cancer  . Cancer Other     FH of Prostate Cancer  . Diabetes Other     Parent  . Heart disease Other     Parent    BP 112/68 mmHg  Pulse 80  Temp(Src) 98.1 F (36.7 C) (Oral)  Ht 5\' 8"  (1.727 m)  Wt 193 lb (87.544 kg)  BMI 29.35 kg/m2  SpO2 97%  Review of Systems He has lost a few lbs.     Objective:   Physical Exam VITAL SIGNS:  See vs page GENERAL: no distress Neck: right thyroid nodule (2-3 cm) is again noted.  No palpable nodule.    outside test results are reviewed: TSH=9.8    Assessment & Plan:  Hypothyroidism, new.  Multinodular goiter, due for recheck.   Patient is advised the following: Patient Instructions  i have sent a prescription to your pharmacy, for the thyroid.   Please have the blood test rechecked in4-6 week, at Dr  Burdine's office. Let's recheck the ultrasound.  you will receive a phone call, about a day and time for an appointment.   Please come back for a follow-up appointment in 6 months.

## 2015-08-08 NOTE — Patient Instructions (Addendum)
i have sent a prescription to your pharmacy, for the thyroid.   Please have the blood test rechecked in4-6 week, at Dr Burdine's office. Let's recheck the ultrasound.  you will receive a phone call, about a day and time for an appointment.   Please come back for a follow-up appointment in 6 months.

## 2015-08-09 ENCOUNTER — Telehealth: Payer: Self-pay | Admitting: Endocrinology

## 2015-08-09 NOTE — Telephone Encounter (Signed)
please call [facility]: This pt needs f/u TSH in 1 month, and pt has requested to do there. Ok?

## 2015-08-10 NOTE — Telephone Encounter (Signed)
I contacted [facility] and they stated the blood work could be done at their office, but we would need to fax the order to them. (fax: [phone]).

## 2015-08-10 NOTE — Telephone Encounter (Signed)
i printed 

## 2015-08-10 NOTE — Telephone Encounter (Signed)
Order faxed to [facility].

## 2015-08-15 ENCOUNTER — Telehealth: Payer: Self-pay | Admitting: Endocrinology

## 2015-08-15 NOTE — Telephone Encounter (Signed)
Fine with me. Please advise [facility]

## 2015-08-15 NOTE — Telephone Encounter (Signed)
[facility] called and said PT prefers to have his Thyroid scans done at [facility] because it is closer to his home.

## 2015-08-15 NOTE — Telephone Encounter (Signed)
See note below and please advise, Thanks! 

## 2015-08-15 NOTE — Telephone Encounter (Signed)
[facility] advised.  

## 2015-08-24 DIAGNOSIS — E042 Nontoxic multinodular goiter: Secondary | ICD-10-CM | POA: Diagnosis not present

## 2015-08-24 DIAGNOSIS — E041 Nontoxic single thyroid nodule: Secondary | ICD-10-CM | POA: Diagnosis not present

## 2015-08-30 ENCOUNTER — Telehealth: Payer: Self-pay | Admitting: Endocrinology

## 2015-08-30 NOTE — Telephone Encounter (Signed)
please call patient: US shows thyroid is slightly smaller--good i'll see you next time.

## 2015-08-30 NOTE — Telephone Encounter (Signed)
I contacted the pt and advised of note below. Pt voiced understanding.  

## 2015-09-11 DIAGNOSIS — H538 Other visual disturbances: Secondary | ICD-10-CM | POA: Diagnosis not present

## 2015-09-11 DIAGNOSIS — H2513 Age-related nuclear cataract, bilateral: Secondary | ICD-10-CM | POA: Diagnosis not present

## 2015-09-11 DIAGNOSIS — E119 Type 2 diabetes mellitus without complications: Secondary | ICD-10-CM | POA: Diagnosis not present

## 2015-10-08 DIAGNOSIS — E0500 Thyrotoxicosis with diffuse goiter without thyrotoxic crisis or storm: Secondary | ICD-10-CM | POA: Diagnosis not present

## 2015-10-21 ENCOUNTER — Telehealth: Payer: Self-pay | Admitting: Endocrinology

## 2015-10-21 NOTE — Telephone Encounter (Signed)
please call patient: i got your result--normal Please continue the same medication. I'll see you next time.

## 2015-10-22 NOTE — Telephone Encounter (Signed)
I contacted the pt's wife and advised of note below. She voiced understanding and will advise the pt.

## 2015-12-04 DIAGNOSIS — I739 Peripheral vascular disease, unspecified: Secondary | ICD-10-CM | POA: Diagnosis not present

## 2015-12-04 DIAGNOSIS — E1165 Type 2 diabetes mellitus with hyperglycemia: Secondary | ICD-10-CM | POA: Diagnosis not present

## 2015-12-04 DIAGNOSIS — E049 Nontoxic goiter, unspecified: Secondary | ICD-10-CM | POA: Diagnosis not present

## 2015-12-04 DIAGNOSIS — E039 Hypothyroidism, unspecified: Secondary | ICD-10-CM | POA: Diagnosis not present

## 2015-12-04 DIAGNOSIS — R600 Localized edema: Secondary | ICD-10-CM | POA: Diagnosis not present

## 2015-12-07 DIAGNOSIS — R600 Localized edema: Secondary | ICD-10-CM | POA: Diagnosis not present

## 2015-12-07 DIAGNOSIS — M7121 Synovial cyst of popliteal space [Baker], right knee: Secondary | ICD-10-CM | POA: Diagnosis not present

## 2015-12-07 DIAGNOSIS — R931 Abnormal findings on diagnostic imaging of heart and coronary circulation: Secondary | ICD-10-CM | POA: Diagnosis not present

## 2016-01-07 ENCOUNTER — Telehealth: Payer: Self-pay | Admitting: Endocrinology

## 2016-01-07 NOTE — Telephone Encounter (Signed)
PT wife called and said that she believes the Levothyroxine are causing his legs and feet to swell.  Requests call back.

## 2016-01-07 NOTE — Telephone Encounter (Signed)
Usually a coincidence, but why don't you skip a few days to see?

## 2016-01-07 NOTE — Telephone Encounter (Signed)
I contacted the patient and advised of message via voicemail. Requested a call back if the patient would like to discuss.  

## 2016-01-07 NOTE — Telephone Encounter (Signed)
See message and please advise, Thanks!  

## 2016-01-10 ENCOUNTER — Telehealth: Payer: Self-pay | Admitting: Endocrinology

## 2016-01-10 NOTE — Telephone Encounter (Signed)
Pt has stopped the thyroid med since Sunday levothyroxine and his swelling of his feet and legs has gone down.

## 2016-01-10 NOTE — Telephone Encounter (Signed)
See message and please advise, Thanks!  

## 2016-01-10 NOTE — Telephone Encounter (Signed)
Thank you. Please retry, to see if the swelling come back

## 2016-01-11 NOTE — Telephone Encounter (Signed)
I contacted the patient's wife and advised of message she stated she will advise the patient and he will call back next week to let us know if the swelling has came back.

## 2016-01-30 ENCOUNTER — Other Ambulatory Visit: Payer: Self-pay

## 2016-01-30 MED ORDER — LEVOTHYROXINE SODIUM 50 MCG PO TABS: 50.0000 ug | ORAL_TABLET | Freq: Every day | ORAL | 3 refills | Status: DC

## 2016-02-05 DIAGNOSIS — M19072 Primary osteoarthritis, left ankle and foot: Secondary | ICD-10-CM | POA: Diagnosis not present

## 2016-02-05 DIAGNOSIS — E039 Hypothyroidism, unspecified: Secondary | ICD-10-CM | POA: Diagnosis not present

## 2016-02-05 DIAGNOSIS — E1165 Type 2 diabetes mellitus with hyperglycemia: Secondary | ICD-10-CM | POA: Diagnosis not present

## 2016-02-05 DIAGNOSIS — R600 Localized edema: Secondary | ICD-10-CM | POA: Diagnosis not present

## 2016-02-05 DIAGNOSIS — E782 Mixed hyperlipidemia: Secondary | ICD-10-CM | POA: Diagnosis not present

## 2016-02-07 ENCOUNTER — Ambulatory Visit (INDEPENDENT_AMBULATORY_CARE_PROVIDER_SITE_OTHER): Payer: 59 | Admitting: Endocrinology

## 2016-02-07 ENCOUNTER — Encounter: Payer: Self-pay | Admitting: Endocrinology

## 2016-02-07 DIAGNOSIS — E890 Postprocedural hypothyroidism: Secondary | ICD-10-CM | POA: Diagnosis not present

## 2016-02-07 LAB — TSH: TSH: 2.69 u[IU]/mL (ref 0.35–4.50)

## 2016-02-07 NOTE — Patient Instructions (Addendum)
A thyroid blood test is requested for you today.  We'll let you know about the results. Please continue the same medication. Please return in 1 year.

## 2016-02-07 NOTE — Progress Notes (Signed)
Subjective:    Patient ID: Duane Stevens, male    DOB: [birth year], 70 y.o.   MRN: [id number]  HPI Pt returns for f/u of multinodular goiter (he had RAI for associated hyperthyroidism in 2012; f/u US in 2015 and 2017 were unchanged; he was euthyroid until 2017, when he required low-dose synthroid).   pt states he feels well in general.  He does not notice the goiter.   Past Medical History:  Diagnosis Date  . Allergy   . Arthritis   . Diabetes mellitus   . Hypothyroidism   . Prostate cancer ([place])    hx of  . Thyroid disease     Past Surgical History:  Procedure Laterality Date  . BACK SURGERY    . HERNIA REPAIR    . PROSTATECTOMY      Social History   Social History  . Marital status: Married    Spouse name: N/A  . Number of children: N/A  . Years of education: N/A   Occupational History  . Retired    Social History Main Topics  . Smoking status: Former Research scientist (life sciences)  . Smokeless tobacco: Not on file     Comment: quit 20-30 years ago  . Alcohol use No  . Drug use: No  . Sexual activity: Not on file   Other Topics Concern  . Not on file   Social History Narrative  . No narrative on file    Current Outpatient Prescriptions on File Prior to Visit  Medication Sig Dispense Refill  . aspirin 81 MG tablet Take 81 mg by mouth daily.      . Cholecalciferol (VITAMIN D3) 1000 UNITS CAPS Take 1 capsule by mouth daily.      Marland Kitchen glipiZIDE (GLUCOTROL) 10 MG tablet     . levothyroxine (SYNTHROID, LEVOTHROID) 50 MCG tablet Take 1 tablet (50 mcg total) by mouth daily before breakfast. 90 tablet 3  . losartan (COZAAR) 25 MG tablet     . metFORMIN (GLUCOPHAGE-XR) 500 MG 24 hr tablet Take 500 mg by mouth. Take 2 tabs twice a day.    . simvastatin (ZOCOR) 20 MG tablet Take 20 mg by mouth daily.      . ETODOLAC PO Take 0.5 tablets by mouth 2 (two) times daily as needed.     . Glucosamine-Chondroitin-Vit D3 1500-1200-800 MG-MG-UNIT PACK Take 1 capsule by mouth 2 (two) times daily.         No current facility-administered medications on file prior to visit.     Allergies  Allergen Reactions  . Bee Venom   . Oxycodone Hcl   . Penicillins     Family History  Problem Relation Age of Onset  . Cancer Other     FH of Lung Cancer  . Cancer Other     FH of Prostate Cancer  . Diabetes Other     Parent  . Heart disease Other     Parent   BP 110/72 (BP Location: Left Arm, Patient Position: Sitting, Cuff Size: Large)   Pulse 77   Wt 197 lb (89.4 kg)   SpO2 99%   BMI 29.95 kg/m   Review of Systems Denies sob.      Objective:   Physical Exam VITAL SIGNS:  See vs page GENERAL: no distress Neck: right thyroid nodule (2 cm) is again noted.    Lab Results  Component Value Date   TSH 2.69 02/07/2016      Assessment & Plan:  Post-RAI hypothyroidism, well-replaced.  Please  continue the same medication. Please return in 1 year.

## 2016-02-08 DIAGNOSIS — E039 Hypothyroidism, unspecified: Secondary | ICD-10-CM | POA: Diagnosis not present

## 2016-02-08 DIAGNOSIS — E782 Mixed hyperlipidemia: Secondary | ICD-10-CM | POA: Diagnosis not present

## 2016-02-08 DIAGNOSIS — E1165 Type 2 diabetes mellitus with hyperglycemia: Secondary | ICD-10-CM | POA: Diagnosis not present

## 2016-02-08 DIAGNOSIS — E049 Nontoxic goiter, unspecified: Secondary | ICD-10-CM | POA: Diagnosis not present

## 2016-02-08 DIAGNOSIS — Z23 Encounter for immunization: Secondary | ICD-10-CM | POA: Diagnosis not present

## 2016-02-08 DIAGNOSIS — I739 Peripheral vascular disease, unspecified: Secondary | ICD-10-CM | POA: Diagnosis not present

## 2016-08-07 DIAGNOSIS — Z0001 Encounter for general adult medical examination with abnormal findings: Secondary | ICD-10-CM | POA: Diagnosis not present

## 2016-08-12 DIAGNOSIS — Z0001 Encounter for general adult medical examination with abnormal findings: Secondary | ICD-10-CM | POA: Diagnosis not present

## 2016-08-12 DIAGNOSIS — E039 Hypothyroidism, unspecified: Secondary | ICD-10-CM | POA: Diagnosis not present

## 2016-08-22 DIAGNOSIS — S46111A Strain of muscle, fascia and tendon of long head of biceps, right arm, initial encounter: Secondary | ICD-10-CM | POA: Diagnosis not present

## 2016-08-28 DIAGNOSIS — S46111A Strain of muscle, fascia and tendon of long head of biceps, right arm, initial encounter: Secondary | ICD-10-CM | POA: Diagnosis not present

## 2016-08-28 DIAGNOSIS — S59901A Unspecified injury of right elbow, initial encounter: Secondary | ICD-10-CM | POA: Diagnosis not present

## 2016-09-02 DIAGNOSIS — S46211D Strain of muscle, fascia and tendon of other parts of biceps, right arm, subsequent encounter: Secondary | ICD-10-CM | POA: Diagnosis not present

## 2016-09-08 DIAGNOSIS — M66821 Spontaneous rupture of other tendons, right upper arm: Secondary | ICD-10-CM | POA: Diagnosis not present

## 2016-09-08 DIAGNOSIS — S46291A Other injury of muscle, fascia and tendon of other parts of biceps, right arm, initial encounter: Secondary | ICD-10-CM | POA: Diagnosis not present

## 2016-09-16 DIAGNOSIS — S46211D Strain of muscle, fascia and tendon of other parts of biceps, right arm, subsequent encounter: Secondary | ICD-10-CM | POA: Diagnosis not present

## 2016-09-23 DIAGNOSIS — S46211D Strain of muscle, fascia and tendon of other parts of biceps, right arm, subsequent encounter: Secondary | ICD-10-CM | POA: Diagnosis not present

## 2016-10-06 DIAGNOSIS — S46211D Strain of muscle, fascia and tendon of other parts of biceps, right arm, subsequent encounter: Secondary | ICD-10-CM | POA: Diagnosis not present

## 2016-10-23 DIAGNOSIS — S46211D Strain of muscle, fascia and tendon of other parts of biceps, right arm, subsequent encounter: Secondary | ICD-10-CM | POA: Diagnosis not present

## 2016-10-30 DIAGNOSIS — M79631 Pain in right forearm: Secondary | ICD-10-CM | POA: Diagnosis not present

## 2016-10-30 DIAGNOSIS — S46299D Other injury of muscle, fascia and tendon of other parts of biceps, unspecified arm, subsequent encounter: Secondary | ICD-10-CM | POA: Diagnosis not present

## 2016-11-04 DIAGNOSIS — M79631 Pain in right forearm: Secondary | ICD-10-CM | POA: Diagnosis not present

## 2016-11-04 DIAGNOSIS — S46299D Other injury of muscle, fascia and tendon of other parts of biceps, unspecified arm, subsequent encounter: Secondary | ICD-10-CM | POA: Diagnosis not present

## 2016-11-11 DIAGNOSIS — S46211D Strain of muscle, fascia and tendon of other parts of biceps, right arm, subsequent encounter: Secondary | ICD-10-CM | POA: Diagnosis not present

## 2016-11-11 DIAGNOSIS — M79631 Pain in right forearm: Secondary | ICD-10-CM | POA: Diagnosis not present

## 2016-11-11 DIAGNOSIS — S46299D Other injury of muscle, fascia and tendon of other parts of biceps, unspecified arm, subsequent encounter: Secondary | ICD-10-CM | POA: Diagnosis not present

## 2016-11-13 DIAGNOSIS — M79631 Pain in right forearm: Secondary | ICD-10-CM | POA: Diagnosis not present

## 2016-11-13 DIAGNOSIS — S46299D Other injury of muscle, fascia and tendon of other parts of biceps, unspecified arm, subsequent encounter: Secondary | ICD-10-CM | POA: Diagnosis not present

## 2016-11-20 DIAGNOSIS — M79631 Pain in right forearm: Secondary | ICD-10-CM | POA: Diagnosis not present

## 2016-11-20 DIAGNOSIS — S46299D Other injury of muscle, fascia and tendon of other parts of biceps, unspecified arm, subsequent encounter: Secondary | ICD-10-CM | POA: Diagnosis not present

## 2016-11-25 DIAGNOSIS — S46211D Strain of muscle, fascia and tendon of other parts of biceps, right arm, subsequent encounter: Secondary | ICD-10-CM | POA: Diagnosis not present

## 2016-11-27 DIAGNOSIS — S46299D Other injury of muscle, fascia and tendon of other parts of biceps, unspecified arm, subsequent encounter: Secondary | ICD-10-CM | POA: Diagnosis not present

## 2016-11-27 DIAGNOSIS — M79631 Pain in right forearm: Secondary | ICD-10-CM | POA: Diagnosis not present

## 2016-12-02 DIAGNOSIS — H524 Presbyopia: Secondary | ICD-10-CM | POA: Diagnosis not present

## 2016-12-02 DIAGNOSIS — H2513 Age-related nuclear cataract, bilateral: Secondary | ICD-10-CM | POA: Diagnosis not present

## 2016-12-02 DIAGNOSIS — H35033 Hypertensive retinopathy, bilateral: Secondary | ICD-10-CM | POA: Diagnosis not present

## 2016-12-04 DIAGNOSIS — M79631 Pain in right forearm: Secondary | ICD-10-CM | POA: Diagnosis not present

## 2016-12-04 DIAGNOSIS — S46299D Other injury of muscle, fascia and tendon of other parts of biceps, unspecified arm, subsequent encounter: Secondary | ICD-10-CM | POA: Diagnosis not present

## 2016-12-11 DIAGNOSIS — M79631 Pain in right forearm: Secondary | ICD-10-CM | POA: Diagnosis not present

## 2016-12-11 DIAGNOSIS — S46299D Other injury of muscle, fascia and tendon of other parts of biceps, unspecified arm, subsequent encounter: Secondary | ICD-10-CM | POA: Diagnosis not present

## 2016-12-16 DIAGNOSIS — S46211D Strain of muscle, fascia and tendon of other parts of biceps, right arm, subsequent encounter: Secondary | ICD-10-CM | POA: Diagnosis not present

## 2016-12-18 ENCOUNTER — Encounter (INDEPENDENT_AMBULATORY_CARE_PROVIDER_SITE_OTHER): Payer: Self-pay | Admitting: Ophthalmology

## 2016-12-25 ENCOUNTER — Other Ambulatory Visit: Payer: Self-pay | Admitting: Endocrinology

## 2016-12-29 ENCOUNTER — Encounter (INDEPENDENT_AMBULATORY_CARE_PROVIDER_SITE_OTHER): Payer: Medicare Other | Admitting: Ophthalmology

## 2016-12-29 DIAGNOSIS — E113292 Type 2 diabetes mellitus with mild nonproliferative diabetic retinopathy without macular edema, left eye: Secondary | ICD-10-CM

## 2016-12-29 DIAGNOSIS — H35033 Hypertensive retinopathy, bilateral: Secondary | ICD-10-CM | POA: Diagnosis not present

## 2016-12-29 DIAGNOSIS — I10 Essential (primary) hypertension: Secondary | ICD-10-CM

## 2016-12-29 DIAGNOSIS — H33302 Unspecified retinal break, left eye: Secondary | ICD-10-CM

## 2016-12-29 DIAGNOSIS — H2513 Age-related nuclear cataract, bilateral: Secondary | ICD-10-CM | POA: Diagnosis not present

## 2016-12-29 DIAGNOSIS — E11319 Type 2 diabetes mellitus with unspecified diabetic retinopathy without macular edema: Secondary | ICD-10-CM

## 2016-12-29 DIAGNOSIS — H33102 Unspecified retinoschisis, left eye: Secondary | ICD-10-CM

## 2016-12-29 DIAGNOSIS — H43813 Vitreous degeneration, bilateral: Secondary | ICD-10-CM

## 2017-02-03 DIAGNOSIS — S46211D Strain of muscle, fascia and tendon of other parts of biceps, right arm, subsequent encounter: Secondary | ICD-10-CM | POA: Diagnosis not present

## 2017-02-03 DIAGNOSIS — M170 Bilateral primary osteoarthritis of knee: Secondary | ICD-10-CM | POA: Diagnosis not present

## 2017-02-05 DIAGNOSIS — E0500 Thyrotoxicosis with diffuse goiter without thyrotoxic crisis or storm: Secondary | ICD-10-CM | POA: Diagnosis not present

## 2017-02-05 DIAGNOSIS — M1991 Primary osteoarthritis, unspecified site: Secondary | ICD-10-CM | POA: Diagnosis not present

## 2017-02-05 DIAGNOSIS — E782 Mixed hyperlipidemia: Secondary | ICD-10-CM | POA: Diagnosis not present

## 2017-02-05 DIAGNOSIS — E1165 Type 2 diabetes mellitus with hyperglycemia: Secondary | ICD-10-CM | POA: Diagnosis not present

## 2017-02-09 ENCOUNTER — Ambulatory Visit: Payer: Medicare Other | Admitting: Endocrinology

## 2017-02-09 DIAGNOSIS — E039 Hypothyroidism, unspecified: Secondary | ICD-10-CM | POA: Diagnosis not present

## 2017-02-09 DIAGNOSIS — E049 Nontoxic goiter, unspecified: Secondary | ICD-10-CM | POA: Diagnosis not present

## 2017-02-09 DIAGNOSIS — E1165 Type 2 diabetes mellitus with hyperglycemia: Secondary | ICD-10-CM | POA: Diagnosis not present

## 2017-02-09 DIAGNOSIS — Z23 Encounter for immunization: Secondary | ICD-10-CM | POA: Diagnosis not present

## 2017-02-09 DIAGNOSIS — E11319 Type 2 diabetes mellitus with unspecified diabetic retinopathy without macular edema: Secondary | ICD-10-CM | POA: Diagnosis not present

## 2017-02-09 DIAGNOSIS — E782 Mixed hyperlipidemia: Secondary | ICD-10-CM | POA: Diagnosis not present

## 2017-02-16 ENCOUNTER — Encounter: Payer: Self-pay | Admitting: Endocrinology

## 2017-02-16 ENCOUNTER — Ambulatory Visit (INDEPENDENT_AMBULATORY_CARE_PROVIDER_SITE_OTHER): Payer: Medicare Other | Admitting: Endocrinology

## 2017-02-16 VITALS — BP 124/76 | HR 89 | Wt 194.0 lb

## 2017-02-16 DIAGNOSIS — E890 Postprocedural hypothyroidism: Secondary | ICD-10-CM | POA: Diagnosis not present

## 2017-02-16 DIAGNOSIS — E039 Hypothyroidism, unspecified: Secondary | ICD-10-CM | POA: Insufficient documentation

## 2017-02-16 NOTE — Patient Instructions (Addendum)
A thyroid blood test is requested for you today.  We'll let you know about the results.   Please return in 1 year.   

## 2017-02-16 NOTE — Progress Notes (Signed)
Subjective:    Patient ID: Duane Stevens, male    DOB: [birth year], 71 y.o.   MRN: [id number]  HPI Pt returns for f/u of multinodular goiter and post-RAI hypothyroidism (he had RAI for associated hyperthyroidism in 2012; f/u US in 2015 and 2017 were unchanged; he was euthyroid until 2017, when he required low-dose synthroid).   pt states he feels well in general.  He does not notice the goiter.   Past Medical History:  Diagnosis Date  . Allergy   . Arthritis   . Diabetes mellitus   . Hypothyroidism   . Prostate cancer ([place])    hx of  . Thyroid disease     Past Surgical History:  Procedure Laterality Date  . BACK SURGERY    . HERNIA REPAIR    . PROSTATECTOMY      Social History   Social History  . Marital status: Married    Spouse name: N/A  . Number of children: N/A  . Years of education: N/A   Occupational History  . Retired    Social History Main Topics  . Smoking status: Former Research scientist (life sciences)  . Smokeless tobacco: Not on file     Comment: quit 20-30 years ago  . Alcohol use No  . Drug use: No  . Sexual activity: Not on file   Other Topics Concern  . Not on file   Social History Narrative  . No narrative on file    Current Outpatient Prescriptions on File Prior to Visit  Medication Sig Dispense Refill  . aspirin 81 MG tablet Take 81 mg by mouth daily.      . Cholecalciferol (VITAMIN D3) 1000 UNITS CAPS Take 1 capsule by mouth daily.      . ETODOLAC PO Take 0.5 tablets by mouth 2 (two) times daily as needed.     Marland Kitchen glipiZIDE (GLUCOTROL) 10 MG tablet     . Glucosamine-Chondroitin-Vit D3 1500-1200-800 MG-MG-UNIT PACK Take 1 capsule by mouth 2 (two) times daily.      Marland Kitchen levothyroxine (SYNTHROID, LEVOTHROID) 50 MCG tablet TAKE 1 TABLET BY MOUTH  DAILY BEFORE BREAKFAST 90 tablet 2  . losartan (COZAAR) 25 MG tablet     . metFORMIN (GLUCOPHAGE-XR) 500 MG 24 hr tablet Take 500 mg by mouth. Take 2 tabs twice a day.    . simvastatin (ZOCOR) 20 MG tablet Take 20 mg by mouth  daily.       No current facility-administered medications on file prior to visit.     Allergies  Allergen Reactions  . Bee Venom   . Oxycodone Hcl   . Penicillins     Family History  Problem Relation Age of Onset  . Cancer Other        FH of Lung Cancer  . Cancer Other        FH of Prostate Cancer  . Diabetes Other        Parent  . Heart disease Other        Parent    BP 124/76   Pulse 89   Wt 194 lb (88 kg)   SpO2 97%   BMI 29.50 kg/m    Review of Systems Denies neck pain.    Objective:   Physical Exam VITAL SIGNS:  See vs page.  GENERAL: no distress.  Neck: right thyroid nodule (2 cm) is again noted.       Assessment & Plan:  multinodular goiter; clinically unchanged. Post-RAI hypothyroidism, due for recheck.   Patient Instructions  A thyroid blood test is requested for you today.  We'll let you know about the results.    Please return in 1 year.

## 2017-02-17 LAB — TSH: TSH: 2.89 u[IU]/mL (ref 0.35–4.50)

## 2017-03-17 DIAGNOSIS — M170 Bilateral primary osteoarthritis of knee: Secondary | ICD-10-CM | POA: Diagnosis not present

## 2017-03-17 DIAGNOSIS — S46211D Strain of muscle, fascia and tendon of other parts of biceps, right arm, subsequent encounter: Secondary | ICD-10-CM | POA: Diagnosis not present

## 2017-04-28 DIAGNOSIS — M659 Synovitis and tenosynovitis, unspecified: Secondary | ICD-10-CM | POA: Diagnosis not present

## 2017-04-28 DIAGNOSIS — M1711 Unilateral primary osteoarthritis, right knee: Secondary | ICD-10-CM | POA: Diagnosis not present

## 2017-05-05 DIAGNOSIS — M1711 Unilateral primary osteoarthritis, right knee: Secondary | ICD-10-CM | POA: Diagnosis not present

## 2017-05-05 DIAGNOSIS — M659 Synovitis and tenosynovitis, unspecified: Secondary | ICD-10-CM | POA: Diagnosis not present

## 2017-05-12 DIAGNOSIS — M1711 Unilateral primary osteoarthritis, right knee: Secondary | ICD-10-CM | POA: Diagnosis not present

## 2017-07-14 DIAGNOSIS — D1722 Benign lipomatous neoplasm of skin and subcutaneous tissue of left arm: Secondary | ICD-10-CM | POA: Diagnosis not present

## 2017-07-14 DIAGNOSIS — M1711 Unilateral primary osteoarthritis, right knee: Secondary | ICD-10-CM | POA: Diagnosis not present

## 2017-08-17 ENCOUNTER — Other Ambulatory Visit: Payer: Self-pay | Admitting: Endocrinology

## 2017-08-17 DIAGNOSIS — E11319 Type 2 diabetes mellitus with unspecified diabetic retinopathy without macular edema: Secondary | ICD-10-CM | POA: Diagnosis not present

## 2017-08-17 DIAGNOSIS — E1165 Type 2 diabetes mellitus with hyperglycemia: Secondary | ICD-10-CM | POA: Diagnosis not present

## 2017-08-17 DIAGNOSIS — E049 Nontoxic goiter, unspecified: Secondary | ICD-10-CM | POA: Diagnosis not present

## 2017-08-17 DIAGNOSIS — E0500 Thyrotoxicosis with diffuse goiter without thyrotoxic crisis or storm: Secondary | ICD-10-CM | POA: Diagnosis not present

## 2017-08-20 DIAGNOSIS — Z23 Encounter for immunization: Secondary | ICD-10-CM | POA: Diagnosis not present

## 2017-08-20 DIAGNOSIS — E049 Nontoxic goiter, unspecified: Secondary | ICD-10-CM | POA: Diagnosis not present

## 2017-08-20 DIAGNOSIS — E782 Mixed hyperlipidemia: Secondary | ICD-10-CM | POA: Diagnosis not present

## 2017-08-20 DIAGNOSIS — E039 Hypothyroidism, unspecified: Secondary | ICD-10-CM | POA: Diagnosis not present

## 2017-08-20 DIAGNOSIS — Z0001 Encounter for general adult medical examination with abnormal findings: Secondary | ICD-10-CM | POA: Diagnosis not present

## 2017-08-20 DIAGNOSIS — E1165 Type 2 diabetes mellitus with hyperglycemia: Secondary | ICD-10-CM | POA: Diagnosis not present

## 2017-09-01 DIAGNOSIS — Z1211 Encounter for screening for malignant neoplasm of colon: Secondary | ICD-10-CM | POA: Diagnosis not present

## 2017-09-01 DIAGNOSIS — Z1212 Encounter for screening for malignant neoplasm of rectum: Secondary | ICD-10-CM | POA: Diagnosis not present

## 2017-09-11 DIAGNOSIS — R05 Cough: Secondary | ICD-10-CM | POA: Diagnosis not present

## 2017-09-11 DIAGNOSIS — J301 Allergic rhinitis due to pollen: Secondary | ICD-10-CM | POA: Diagnosis not present

## 2017-09-14 DIAGNOSIS — J301 Allergic rhinitis due to pollen: Secondary | ICD-10-CM | POA: Diagnosis not present

## 2017-09-14 DIAGNOSIS — J209 Acute bronchitis, unspecified: Secondary | ICD-10-CM | POA: Diagnosis not present

## 2017-11-23 DIAGNOSIS — L723 Sebaceous cyst: Secondary | ICD-10-CM | POA: Diagnosis not present

## 2017-11-25 DIAGNOSIS — R222 Localized swelling, mass and lump, trunk: Secondary | ICD-10-CM | POA: Diagnosis not present

## 2017-11-25 DIAGNOSIS — M40294 Other kyphosis, thoracic region: Secondary | ICD-10-CM | POA: Diagnosis not present

## 2017-11-25 DIAGNOSIS — E041 Nontoxic single thyroid nodule: Secondary | ICD-10-CM | POA: Diagnosis not present

## 2017-12-10 ENCOUNTER — Encounter: Payer: Self-pay | Admitting: Endocrinology

## 2017-12-10 ENCOUNTER — Ambulatory Visit (INDEPENDENT_AMBULATORY_CARE_PROVIDER_SITE_OTHER): Payer: Medicare Other | Admitting: Endocrinology

## 2017-12-10 VITALS — BP 118/78 | HR 77 | Ht 68.0 in | Wt 186.6 lb

## 2017-12-10 DIAGNOSIS — E042 Nontoxic multinodular goiter: Secondary | ICD-10-CM

## 2017-12-10 NOTE — Patient Instructions (Addendum)
Please continue to have your thyroid blood test checked each year with Dr Pleas Koch. Please continue the same medications.   Please return in 1 year, when you will be due for another ultrasound.

## 2017-12-10 NOTE — Progress Notes (Signed)
Subjective:    Patient ID: Duane Stevens, male    DOB: [birth year], 72 y.o.   MRN: [id number]  HPI Pt returns for f/u of multinodular goiter and post-RAI hypothyroidism (he had RAI for associated hyperthyroidism in 2012; f/u US in 2015 and 2017 were unchanged; he was euthyroid until 2017, when he required low-dose synthroid).   pt states he feels well in general.  He does not notice the goiter, but he says it was again noted on MRI.   Past Medical History:  Diagnosis Date  . Allergy   . Arthritis   . Diabetes mellitus   . Hypothyroidism   . Prostate cancer ([place])    hx of  . Thyroid disease      Social History   Socioeconomic History  . Marital status: Married    Spouse name: Not on file  . Number of children: Not on file  . Years of education: Not on file  . Highest education level: Not on file  Occupational History  . Occupation: Retired  Scientific laboratory technician  . Financial resource strain: Not on file  . Food insecurity:    Worry: Not on file    Inability: Not on file  . Transportation needs:    Medical: Not on file    Non-medical: Not on file  Tobacco Use  . Smoking status: Former Research scientist (life sciences)  . Smokeless tobacco: Never Used  . Tobacco comment: quit 20-30 years ago  Substance and Sexual Activity  . Alcohol use: No  . Drug use: No  . Sexual activity: Not Currently  Lifestyle  . Physical activity:    Days per week: Not on file    Minutes per session: Not on file  . Stress: Not on file  Relationships  . Social connections:    Talks on phone: Not on file    Gets together: Not on file    Attends religious service: Not on file    Active member of club or organization: Not on file    Attends meetings of clubs or organizations: Not on file    Relationship status: Not on file  . Intimate partner violence:    Fear of current or ex partner: Not on file    Emotionally abused: Not on file    Physically abused: Not on file    Forced sexual activity: Not on file  Other Topics  Concern  . Not on file  Social History Narrative  . Not on file    Current Outpatient Medications on File Prior to Visit  Medication Sig Dispense Refill  . aspirin 81 MG tablet Take 81 mg by mouth daily.      . Cholecalciferol (VITAMIN D3) 1000 UNITS CAPS Take 1 capsule by mouth daily.      . ETODOLAC PO Take 0.5 tablets by mouth 2 (two) times daily as needed.     . fluticasone (FLONASE) 50 MCG/ACT nasal spray     . glipiZIDE (GLUCOTROL) 10 MG tablet     . Glucosamine-Chondroitin-Vit D3 1500-1200-800 MG-MG-UNIT PACK Take 1 capsule by mouth 2 (two) times daily.      Marland Kitchen levothyroxine (SYNTHROID, LEVOTHROID) 50 MCG tablet TAKE 1 TABLET BY MOUTH  DAILY BEFORE BREAKFAST 90 tablet 2  . losartan (COZAAR) 25 MG tablet     . metFORMIN (GLUCOPHAGE-XR) 500 MG 24 hr tablet Take 500 mg by mouth. Take 2 tabs twice a day.    . simvastatin (ZOCOR) 20 MG tablet Take 20 mg by mouth daily.  No current facility-administered medications on file prior to visit.     Allergies  Allergen Reactions  . Bee Venom   . Oxycodone Hcl   . Penicillins     Family History  Problem Relation Age of Onset  . Cancer Other        FH of Lung Cancer  . Cancer Other        FH of Prostate Cancer  . Diabetes Other        Parent  . Heart disease Other        Parent    BP 118/78 (BP Location: Left Arm, Patient Position: Sitting, Cuff Size: Normal)   Pulse 77   Ht 5\' 8"  (1.727 m)   Wt 186 lb 9.6 oz (84.6 kg)   SpO2 98%   BMI 28.37 kg/m    Review of Systems Denies neck pain    Objective:   Physical Exam VITAL SIGNS:  See vs page.   GENERAL: no distress.   Neck: right thyroid nodule (2 cm) is again noted. It is freely mobile.      Assessment & Plan:  Multinodular goiter: clinically stable.  Hypothyroidism: apparently well-replaced  Patient Instructions  Please continue to have your thyroid blood test checked each year with Dr Pleas Koch.   Please continue the same medications.   Please return in 1  year, when you will be due for another ultrasound.

## 2018-02-15 DIAGNOSIS — E039 Hypothyroidism, unspecified: Secondary | ICD-10-CM | POA: Diagnosis not present

## 2018-02-15 DIAGNOSIS — E1165 Type 2 diabetes mellitus with hyperglycemia: Secondary | ICD-10-CM | POA: Diagnosis not present

## 2018-02-15 DIAGNOSIS — E11319 Type 2 diabetes mellitus with unspecified diabetic retinopathy without macular edema: Secondary | ICD-10-CM | POA: Diagnosis not present

## 2018-02-15 DIAGNOSIS — E782 Mixed hyperlipidemia: Secondary | ICD-10-CM | POA: Diagnosis not present

## 2018-02-18 DIAGNOSIS — Z0001 Encounter for general adult medical examination with abnormal findings: Secondary | ICD-10-CM | POA: Diagnosis not present

## 2018-02-22 DIAGNOSIS — H3561 Retinal hemorrhage, right eye: Secondary | ICD-10-CM | POA: Diagnosis not present

## 2018-02-23 DIAGNOSIS — M659 Synovitis and tenosynovitis, unspecified: Secondary | ICD-10-CM | POA: Diagnosis not present

## 2018-02-24 DIAGNOSIS — I739 Peripheral vascular disease, unspecified: Secondary | ICD-10-CM | POA: Diagnosis not present

## 2018-02-24 DIAGNOSIS — Z720 Tobacco use: Secondary | ICD-10-CM | POA: Diagnosis not present

## 2018-02-24 DIAGNOSIS — R600 Localized edema: Secondary | ICD-10-CM | POA: Diagnosis not present

## 2018-02-24 DIAGNOSIS — I517 Cardiomegaly: Secondary | ICD-10-CM | POA: Diagnosis not present

## 2018-02-24 DIAGNOSIS — I5032 Chronic diastolic (congestive) heart failure: Secondary | ICD-10-CM | POA: Diagnosis not present

## 2018-02-25 ENCOUNTER — Encounter (INDEPENDENT_AMBULATORY_CARE_PROVIDER_SITE_OTHER): Payer: Medicare Other | Admitting: Ophthalmology

## 2018-02-25 DIAGNOSIS — I10 Essential (primary) hypertension: Secondary | ICD-10-CM

## 2018-02-25 DIAGNOSIS — H33301 Unspecified retinal break, right eye: Secondary | ICD-10-CM

## 2018-02-25 DIAGNOSIS — H4311 Vitreous hemorrhage, right eye: Secondary | ICD-10-CM

## 2018-02-25 DIAGNOSIS — H43813 Vitreous degeneration, bilateral: Secondary | ICD-10-CM | POA: Diagnosis not present

## 2018-02-25 DIAGNOSIS — H2513 Age-related nuclear cataract, bilateral: Secondary | ICD-10-CM

## 2018-02-25 DIAGNOSIS — H33102 Unspecified retinoschisis, left eye: Secondary | ICD-10-CM

## 2018-02-25 DIAGNOSIS — H35033 Hypertensive retinopathy, bilateral: Secondary | ICD-10-CM

## 2018-02-26 ENCOUNTER — Other Ambulatory Visit (HOSPITAL_COMMUNITY): Payer: Self-pay | Admitting: Orthopedic Surgery

## 2018-02-26 DIAGNOSIS — M7989 Other specified soft tissue disorders: Secondary | ICD-10-CM

## 2018-02-26 DIAGNOSIS — M79604 Pain in right leg: Secondary | ICD-10-CM

## 2018-03-02 ENCOUNTER — Ambulatory Visit (HOSPITAL_COMMUNITY)
Admission: RE | Admit: 2018-03-02 | Discharge: 2018-03-02 | Disposition: A | Discharge status: Home / Self Care | Payer: Medicare Other | Source: Ambulatory Visit | Attending: Orthopedic Surgery | Admitting: Orthopedic Surgery

## 2018-03-02 DIAGNOSIS — M7989 Other specified soft tissue disorders: Secondary | ICD-10-CM

## 2018-03-02 DIAGNOSIS — M79604 Pain in right leg: Secondary | ICD-10-CM

## 2018-03-05 ENCOUNTER — Encounter (INDEPENDENT_AMBULATORY_CARE_PROVIDER_SITE_OTHER): Payer: Medicare Other | Admitting: Ophthalmology

## 2018-03-05 DIAGNOSIS — E11319 Type 2 diabetes mellitus with unspecified diabetic retinopathy without macular edema: Secondary | ICD-10-CM

## 2018-03-05 DIAGNOSIS — H33301 Unspecified retinal break, right eye: Secondary | ICD-10-CM

## 2018-03-05 DIAGNOSIS — E113293 Type 2 diabetes mellitus with mild nonproliferative diabetic retinopathy without macular edema, bilateral: Secondary | ICD-10-CM | POA: Diagnosis not present

## 2018-06-04 ENCOUNTER — Other Ambulatory Visit: Payer: Self-pay | Admitting: Endocrinology

## 2018-07-06 ENCOUNTER — Encounter (INDEPENDENT_AMBULATORY_CARE_PROVIDER_SITE_OTHER): Payer: Medicare Other | Admitting: Ophthalmology

## 2018-07-06 ENCOUNTER — Other Ambulatory Visit: Payer: Self-pay

## 2018-07-06 DIAGNOSIS — H2513 Age-related nuclear cataract, bilateral: Secondary | ICD-10-CM

## 2018-07-06 DIAGNOSIS — H35033 Hypertensive retinopathy, bilateral: Secondary | ICD-10-CM

## 2018-07-06 DIAGNOSIS — I10 Essential (primary) hypertension: Secondary | ICD-10-CM

## 2018-07-06 DIAGNOSIS — H33303 Unspecified retinal break, bilateral: Secondary | ICD-10-CM

## 2018-07-06 DIAGNOSIS — H33102 Unspecified retinoschisis, left eye: Secondary | ICD-10-CM | POA: Diagnosis not present

## 2018-07-06 DIAGNOSIS — H43813 Vitreous degeneration, bilateral: Secondary | ICD-10-CM

## 2018-07-15 ENCOUNTER — Emergency Department (HOSPITAL_COMMUNITY)
Admission: EM | Admit: 2018-07-15 | Discharge: 2018-07-15 | Disposition: A | Discharge status: Home / Self Care | Payer: Medicare Other | Attending: Emergency Medicine | Admitting: Emergency Medicine

## 2018-07-15 ENCOUNTER — Emergency Department (HOSPITAL_COMMUNITY): Payer: Medicare Other

## 2018-07-15 ENCOUNTER — Encounter (HOSPITAL_COMMUNITY): Payer: Self-pay

## 2018-07-15 ENCOUNTER — Other Ambulatory Visit: Payer: Self-pay

## 2018-07-15 DIAGNOSIS — E039 Hypothyroidism, unspecified: Secondary | ICD-10-CM | POA: Diagnosis not present

## 2018-07-15 DIAGNOSIS — R42 Dizziness and giddiness: Secondary | ICD-10-CM | POA: Diagnosis not present

## 2018-07-15 DIAGNOSIS — I10 Essential (primary) hypertension: Secondary | ICD-10-CM | POA: Insufficient documentation

## 2018-07-15 DIAGNOSIS — Z87891 Personal history of nicotine dependence: Secondary | ICD-10-CM | POA: Diagnosis not present

## 2018-07-15 DIAGNOSIS — Z79899 Other long term (current) drug therapy: Secondary | ICD-10-CM | POA: Diagnosis not present

## 2018-07-15 DIAGNOSIS — Z7982 Long term (current) use of aspirin: Secondary | ICD-10-CM | POA: Diagnosis not present

## 2018-07-15 DIAGNOSIS — Z7984 Long term (current) use of oral hypoglycemic drugs: Secondary | ICD-10-CM | POA: Insufficient documentation

## 2018-07-15 DIAGNOSIS — E119 Type 2 diabetes mellitus without complications: Secondary | ICD-10-CM | POA: Insufficient documentation

## 2018-07-15 DIAGNOSIS — R270 Ataxia, unspecified: Secondary | ICD-10-CM | POA: Diagnosis not present

## 2018-07-15 HISTORY — DX: Essential (primary) hypertension: I10

## 2018-07-15 HISTORY — DX: Pure hypercholesterolemia, unspecified: E78.00

## 2018-07-15 LAB — HEPATIC FUNCTION PANEL
ALT: 10 U/L (ref 0–44)
AST: 14 U/L — ABNORMAL LOW (ref 15–41)
Albumin: 4.1 g/dL (ref 3.5–5.0)
Alkaline Phosphatase: 86 U/L (ref 38–126)
BILIRUBIN TOTAL: 0.5 mg/dL (ref 0.3–1.2)
Bilirubin, Direct: 0.1 mg/dL (ref 0.0–0.2)
Indirect Bilirubin: 0.4 mg/dL (ref 0.3–0.9)
Total Protein: 7.2 g/dL (ref 6.5–8.1)

## 2018-07-15 LAB — CBC
HCT: 48.2 % (ref 39.0–52.0)
Hemoglobin: 16 g/dL (ref 13.0–17.0)
MCH: 28 pg (ref 26.0–34.0)
MCHC: 33.2 g/dL (ref 30.0–36.0)
MCV: 84.3 fL (ref 80.0–100.0)
NRBC: 0 % (ref 0.0–0.2)
Platelets: 224 10*3/uL (ref 150–400)
RBC: 5.72 MIL/uL (ref 4.22–5.81)
RDW: 13.2 % (ref 11.5–15.5)
WBC: 13.2 10*3/uL — ABNORMAL HIGH (ref 4.0–10.5)

## 2018-07-15 LAB — URINALYSIS, ROUTINE W REFLEX MICROSCOPIC
Bacteria, UA: NONE SEEN
Bilirubin Urine: NEGATIVE
Hgb urine dipstick: NEGATIVE
Ketones, ur: 5 mg/dL — AB
LEUKOCYTE UA: NEGATIVE
Nitrite: NEGATIVE
PROTEIN: NEGATIVE mg/dL
Specific Gravity, Urine: 1.012 (ref 1.005–1.030)
pH: 5 (ref 5.0–8.0)

## 2018-07-15 LAB — DIFFERENTIAL
BASOS ABS: 0.1 10*3/uL (ref 0.0–0.1)
Basophils Relative: 0 %
Eosinophils Absolute: 0.1 10*3/uL (ref 0.0–0.5)
Eosinophils Relative: 1 %
LYMPHS ABS: 2.2 10*3/uL (ref 0.7–4.0)
Lymphocytes Relative: 17 %
Monocytes Absolute: 1 10*3/uL (ref 0.1–1.0)
Monocytes Relative: 8 %
Neutro Abs: 9.3 10*3/uL — ABNORMAL HIGH (ref 1.7–7.7)
Neutrophils Relative %: 73 %

## 2018-07-15 LAB — BASIC METABOLIC PANEL
Anion gap: 11 (ref 5–15)
BUN: 19 mg/dL (ref 8–23)
CHLORIDE: 99 mmol/L (ref 98–111)
CO2: 25 mmol/L (ref 22–32)
Calcium: 9.5 mg/dL (ref 8.9–10.3)
Creatinine, Ser: 0.9 mg/dL (ref 0.61–1.24)
GFR calc non Af Amer: >60 mL/min (ref >60)
Glucose, Bld: 211 mg/dL — ABNORMAL HIGH (ref 70–99)
Potassium: 3.8 mmol/L (ref 3.5–5.1)
Sodium: 135 mmol/L (ref 135–145)

## 2018-07-15 LAB — CBG MONITORING, ED: Glucose-Capillary: 169 mg/dL — ABNORMAL HIGH (ref 70–99)

## 2018-07-15 MED ORDER — MECLIZINE HCL 25 MG PO TABS: 25.0000 mg | ORAL_TABLET | Freq: Three times a day (TID) | ORAL | 0 refills | Status: AC | PRN

## 2018-07-15 MED ORDER — MECLIZINE HCL 12.5 MG PO TABS
25.0000 mg | ORAL_TABLET | Freq: Once | ORAL | Status: AC
  Administered 2018-07-15: 25 mg via ORAL
  Filled 2018-07-15: qty 2

## 2018-07-15 MED ORDER — SODIUM CHLORIDE 0.9 % IV BOLUS
500.0000 mL | Freq: Once | INTRAVENOUS | Status: AC
  Administered 2018-07-15: 500 mL via INTRAVENOUS

## 2018-07-15 MED ORDER — SODIUM CHLORIDE 0.9% FLUSH: 3.0000 mL | Freq: Once | INTRAVENOUS | Status: DC

## 2018-07-15 MED ORDER — ONDANSETRON HCL 4 MG/2ML IJ SOLN
4.0000 mg | Freq: Once | INTRAMUSCULAR | Status: DC
  Filled 2018-07-15: qty 2

## 2018-07-15 NOTE — ED Notes (Signed)
EKG given to Dr. Zackowski  

## 2018-07-15 NOTE — ED Notes (Signed)
Pt back from MRI 

## 2018-07-15 NOTE — ED Notes (Signed)
Pt ambulated to restroom. Slight dizziness.

## 2018-07-15 NOTE — ED Provider Notes (Signed)
[facility] EMERGENCY DEPARTMENT Provider Note   CSN: [id number] Arrival date & time: 07/15/18  1534    History   Chief Complaint Chief Complaint  Patient presents with  . Dizziness    HPI Duane Stevens is a 73 y.o. male.     HPI   Duane Stevens is a 74 y.o. male who presents to the Emergency Department complaining of dizziness with position change, nausea and fatigue.  Symptoms have been present for 3 days.  He describes a "spinning" sensation upon standing and sudden position changes.  Symptoms are not associated with headache, visual changes, facial or extremity numbness or weakness.  He also reports several episodes of diarrhea and intermittent "hot flashes" since onset of his symptoms.  States that his blood sugar has been slighlty elevated since onset of his symptoms.  No new medications, no recent sick contacts.  He denies known fever at home, chest pain, abdominal pain, shortness of breath, numbness or weakness of face or extremities.  No changes of speech or dysuria.       Past Medical History:  Diagnosis Date  . Allergy   . Arthritis   . Diabetes mellitus   . Hypercholesterolemia   . Hypertension   . Hypothyroidism   . Prostate cancer ([place])    hx of  . Thyroid disease     Patient Active Problem List   Diagnosis Date Noted  . Hypothyroidism 02/16/2017  . Type II or unspecified type diabetes mellitus without mention of complication, not stated as uncontrolled 10/15/2010  . Pure hypercholesterolemia 10/15/2010  . Osteoarthrosis, unspecified whether generalized or localized, unspecified site 10/15/2010  . Multinodular goiter 10/15/2010    Past Surgical History:  Procedure Laterality Date  . ANKLE SURGERY    . BACK SURGERY    . HERNIA REPAIR    . PROSTATECTOMY          Home Medications    Prior to Admission medications   Medication Sig Start Date End Date Taking? Authorizing Provider  aspirin 81 MG tablet Take 81 mg by mouth daily.       [provider]  Cholecalciferol (VITAMIN D3) 1000 UNITS CAPS Take 1 capsule by mouth daily.      [provider]  ETODOLAC PO Take 0.5 tablets by mouth 2 (two) times daily as needed.     [provider]  fluticasone Asencion Islam) 50 MCG/ACT nasal spray  12/01/17   [provider]  glipiZIDE (GLUCOTROL) 10 MG tablet  09/14/13   [provider]  Glucosamine-Chondroitin-Vit D3 1500-1200-800 MG-MG-UNIT PACK Take 1 capsule by mouth 2 (two) times daily.      [provider]  levothyroxine (SYNTHROID, LEVOTHROID) 50 MCG tablet TAKE 1 TABLET BY MOUTH  DAILY BEFORE BREAKFAST 06/04/18   Renato Shin, MD  losartan (COZAAR) 25 MG tablet  09/05/13   [provider]  metFORMIN (GLUCOPHAGE-XR) 500 MG 24 hr tablet Take 500 mg by mouth. Take 2 tabs twice a day.    [provider]  simvastatin (ZOCOR) 20 MG tablet Take 20 mg by mouth daily.      [provider]    Family History Family History  Problem Relation Age of Onset  . Cancer Other        FH of Lung Cancer  . Cancer Other        FH of Prostate Cancer  . Diabetes Other        Parent  . Heart disease Other  Parent    Social History Social History   Tobacco Use  . Smoking status: Former Research scientist (life sciences)  . Smokeless tobacco: Never Used  . Tobacco comment: quit 20-30 years ago  Substance Use Topics  . Alcohol use: No  . Drug use: No     Allergies   Bee venom; Oxycodone hcl; and Penicillins   Review of Systems Review of Systems  Constitutional: Positive for fatigue. Negative for activity change, appetite change, chills and fever.  HENT: Negative for congestion, ear pain and trouble swallowing.   Eyes: Negative for photophobia, pain and visual disturbance.  Respiratory: Negative for cough, chest tightness and shortness of breath.   Cardiovascular: Negative for chest pain and leg swelling.  Gastrointestinal: Positive for diarrhea and nausea. Negative for abdominal  pain and vomiting.  Genitourinary: Negative for decreased urine volume and dysuria.  Musculoskeletal: Negative for neck pain and neck stiffness.  Skin: Negative for rash and wound.  Neurological: Positive for dizziness. Negative for syncope, facial asymmetry, speech difficulty, weakness, numbness and headaches.  Psychiatric/Behavioral: Negative for confusion and decreased concentration.     Physical Exam Updated Vital Signs BP (!) 148/85 (BP Location: Right Arm)   Pulse 92   Temp 98.3 F (36.8 C) (Oral)   Resp 18   Ht 5\' 9"  (1.753 m)   Wt 90.7 kg   SpO2 99%   BMI 29.53 kg/m   Physical Exam Vitals signs and nursing note reviewed.  Constitutional:      Appearance: Normal appearance. He is not ill-appearing or toxic-appearing.  HENT:     Head: Normocephalic.     Right Ear: Tympanic membrane and ear canal normal.     Left Ear: Tympanic membrane and ear canal normal.     Mouth/Throat:     Mouth: Mucous membranes are moist.     Pharynx: Oropharynx is clear.  Eyes:     Extraocular Movements: Extraocular movements intact.     Conjunctiva/sclera: Conjunctivae normal.     Pupils: Pupils are equal, round, and reactive to light.  Neck:     Musculoskeletal: Normal range of motion.  Cardiovascular:     Rate and Rhythm: Normal rate and regular rhythm.     Pulses: Normal pulses.  Pulmonary:     Effort: Pulmonary effort is normal.     Breath sounds: Normal breath sounds. No wheezing.  Chest:     Chest wall: No tenderness.  Abdominal:     General: There is no distension.     Palpations: Abdomen is soft.     Tenderness: There is no abdominal tenderness.  Musculoskeletal: Normal range of motion.  Lymphadenopathy:     Cervical: No cervical adenopathy.  Skin:    General: Skin is warm.     Capillary Refill: Capillary refill takes less than 2 seconds.     Findings: No rash.  Neurological:     General: No focal deficit present.     Mental Status: He is alert and oriented to person,  place, and time.     GCS: GCS eye subscore is 4. GCS verbal subscore is 5. GCS motor subscore is 6.     Sensory: Sensation is intact. No sensory deficit.     Motor: Motor function is intact. No weakness.     Coordination: Coordination normal.     Gait: Gait is intact.     Comments: CN II-XII intact.  Speech clear, no pronator drift.  nml finger nose and heel shin testing.    Psychiatric:  Mood and Affect: Mood normal.        Thought Content: Thought content normal.      ED Treatments / Results  Labs (all labs ordered are listed, but only abnormal results are displayed) Labs Reviewed  BASIC METABOLIC PANEL - Abnormal; Notable for the following components:      Result Value   Glucose, Bld 211 (*)    All other components within normal limits  CBC - Abnormal; Notable for the following components:   WBC 13.2 (*)    All other components within normal limits  URINALYSIS, ROUTINE W REFLEX MICROSCOPIC - Abnormal; Notable for the following components:   Color, Urine STRAW (*)    Glucose, UA >=500 (*)    Ketones, ur 5 (*)    All other components within normal limits  HEPATIC FUNCTION PANEL - Abnormal; Notable for the following components:   AST 14 (*)    All other components within normal limits  DIFFERENTIAL - Abnormal; Notable for the following components:   Neutro Abs 9.3 (*)    All other components within normal limits  CBG MONITORING, ED - Abnormal; Notable for the following components:   Glucose-Capillary 169 (*)    All other components within normal limits    EKG EKG Interpretation  Date/Time:  Thursday July 15 2018 15:58:14 EDT Ventricular Rate:  88 PR Interval:    QRS Duration: 92 QT Interval:  369 QTC Calculation: 447 R Axis:   -42 Text Interpretation:  Sinus rhythm Left axis deviation Low voltage, precordial leads No previous ECGs available Confirmed by Fredia Sorrow [id number]) on 07/15/2018 5:33:29 PM   Radiology Mr Brain Wo Contrast (neuro Protocol)   Result Date: 07/15/2018 CLINICAL DATA:  Acute presentation with ataxia.  Suspected stroke. EXAM: MRI HEAD WITHOUT CONTRAST TECHNIQUE: Multiplanar, multiecho pulse sequences of the brain and surrounding structures were obtained without intravenous contrast. COMPARISON:  None. FINDINGS: Brain: Diffusion imaging does not show any acute or subacute infarction. The brainstem is normal. There is an old small vessel infarction within the cerebellum on right. Cerebral hemispheres show age related volume loss with mild chronic small-vessel ischemic change of white. No or large vessel territory infarction. No mass lesion, hemorrhage, hydrocephalus or extra-axial collection. Vascular: Major vessels at the base of the brain show flow. Skull and upper cervical spine: Negative Sinuses/Orbits: Mild seasonal mucosal thickening of the maxillary sinuses. No likely significant sinusitis. No fluid in the middle ears or mastoids. Orbits negative. Other: None IMPRESSION: No acute finding. No explanation for the acute clinical presentation. 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 There are mild chronic small-vessel ischemic changes of the cerebral hemispheric white matter. Electronically Signed   By: Nelson Chimes M.D.   On: 07/15/2018 18:19    Procedures Procedures (including critical care time)  Medications Ordered in ED Medications  sodium chloride flush (NS) 0.9 % injection 3 mL (3 mLs Intravenous Not Given 07/15/18 1609)  sodium chloride 0.9 % bolus 500 mL (has no administration in time range)  ondansetron (ZOFRAN) injection 4 mg (has no administration in time range)     Initial Impression / Assessment and Plan / ED Course  I have reviewed the triage vital signs and the nursing notes.  Pertinent labs & imaging results that were available during my care of the patient were reviewed by me and considered in my medical decision making (see chart for details).         Pt  with vertiginous sx's.  No focal neuro deficits.  Work up reassuring.  Findings discussed with, and pt also seen by, Dr. Rogene Houston.  On recheck, patient ambulating in the room and has ambulated to the restroom without difficulty.  No ataxia.  Reports feeling better after meclizine.  No focal neuro deficits on exam.  He is requesting discharge home.  I feel this is appropriate, I have discussed patient findings with Dr. Rogene Houston.  Patient agrees to treatment plan and close outpatient follow-up.  Strict return precautions were also discussed.  Final Clinical Impressions(s) / ED Diagnoses   Final diagnoses:  Vertigo    ED Discharge Orders    None       Kem Parkinson, PA-C 07/16/18 1705    Fredia Sorrow, MD 07/28/18 [place]

## 2018-07-15 NOTE — ED Triage Notes (Signed)
Pt presents to ED with complaints of dizziness, nausea and fatigue which started Monday or Tuesday. Pt states he has had some hot flashes but hasn't checked his temperature.

## 2018-07-15 NOTE — ED Notes (Signed)
Pt to MRI

## 2018-07-15 NOTE — Discharge Instructions (Addendum)
Take the prescription medicine as directed if needed for dizziness.  Follow-up with your primary doctor for recheck.  Return to the ER for any worsening symptoms

## 2018-07-16 NOTE — ED Provider Notes (Signed)
Medical screening examination/treatment/procedure(s) were conducted as a shared visit with non-physician practitioner(s) and myself.  I personally evaluated the patient during the encounter.  EKG Interpretation  Date/Time:  Thursday July 15 2018 15:58:14 EDT Ventricular Rate:  88 PR Interval:    QRS Duration: 92 QT Interval:  369 QTC Calculation: 447 R Axis:   -42 Text Interpretation:  Sinus rhythm Left axis deviation Low voltage, precordial leads No previous ECGs available Confirmed by Fredia Sorrow [phone]) on 07/15/2018 5:33:29 PM   Patient seen by me along with physician assistant.  Patient presented to the emergency department brought in by family member.  Complaint was dizziness nausea and fatigue this started on Monday.  Patient stated he has had some hot flashes but he has not checked his temperature is afebrile here.  Patient without any appropriate respiratory symptoms.  Patient does describe dizziness him with some component of vertigo.  Based on this recommended MRI brain we went right to MRI because we are getting to the point with a brain go home for the day.  To make sure there was no evidence of stroke or any acute abnormality it was negative.  Patient given some Ativan with some improvement in symptoms.  Patient improved in the emergency department patient stable for discharge home.   Fredia Sorrow, MD 07/16/18 [phone]

## 2018-08-16 DIAGNOSIS — E039 Hypothyroidism, unspecified: Secondary | ICD-10-CM | POA: Diagnosis not present

## 2018-08-16 DIAGNOSIS — E782 Mixed hyperlipidemia: Secondary | ICD-10-CM | POA: Diagnosis not present

## 2018-08-16 DIAGNOSIS — E11319 Type 2 diabetes mellitus with unspecified diabetic retinopathy without macular edema: Secondary | ICD-10-CM | POA: Diagnosis not present

## 2018-08-16 DIAGNOSIS — E1165 Type 2 diabetes mellitus with hyperglycemia: Secondary | ICD-10-CM | POA: Diagnosis not present

## 2018-08-16 DIAGNOSIS — I5032 Chronic diastolic (congestive) heart failure: Secondary | ICD-10-CM | POA: Diagnosis not present

## 2018-08-18 DIAGNOSIS — E11319 Type 2 diabetes mellitus with unspecified diabetic retinopathy without macular edema: Secondary | ICD-10-CM | POA: Diagnosis not present

## 2018-08-18 DIAGNOSIS — E039 Hypothyroidism, unspecified: Secondary | ICD-10-CM | POA: Diagnosis not present

## 2018-08-18 DIAGNOSIS — E1151 Type 2 diabetes mellitus with diabetic peripheral angiopathy without gangrene: Secondary | ICD-10-CM | POA: Diagnosis not present

## 2018-08-18 DIAGNOSIS — I739 Peripheral vascular disease, unspecified: Secondary | ICD-10-CM | POA: Diagnosis not present

## 2018-08-18 DIAGNOSIS — E782 Mixed hyperlipidemia: Secondary | ICD-10-CM | POA: Diagnosis not present

## 2018-09-01 ENCOUNTER — Ambulatory Visit (INDEPENDENT_AMBULATORY_CARE_PROVIDER_SITE_OTHER): Payer: Medicare Other | Admitting: Endocrinology

## 2018-09-01 DIAGNOSIS — E042 Nontoxic multinodular goiter: Secondary | ICD-10-CM

## 2018-09-01 NOTE — Patient Instructions (Addendum)
  Let's recheck the ultrasound.  you will receive a phone call, about a day and time for an appointment.  Please return in 1 year, when you will be due for another ultrasound.

## 2018-09-01 NOTE — Progress Notes (Signed)
Subjective:    Patient ID: Duane Stevens, male    DOB: June 26, 1945, 73 y.o.   MRN: [id number]  HPI  telehealth visit today via Phone x 11 minutes. Alternatives to telehealth are presented to this patient, and the patient agrees to the telehealth visit. Pt is advised of the cost of the visit, and agrees to this, also.   Patient is at home, and I am at the office.   Persons attending the telehealth visit: the patient and I Pt returns for f/u of multinodular goiter and post-RAI hypothyroidism (he had RAI for associated hyperthyroidism in 2012; f/u US in 2015 and 2017 were unchanged; he has never had bx; he was euthyroid until 2017, when he began to require synthroid).   Pt says he was recently noted to have abnormal TFT at Dr Burdine's office.  Main symptom is fatigue. He verifies synthroid dosage is 50 mcg/d.   Past Medical History:  Diagnosis Date  . Allergy   . Arthritis   . Diabetes mellitus   . Hypercholesterolemia   . Hypertension   . Hypothyroidism   . Prostate cancer ([place])    hx of  . Thyroid disease     Past Surgical History:  Procedure Laterality Date  . ANKLE SURGERY    . BACK SURGERY    . HERNIA REPAIR    . PROSTATECTOMY      Social History   Socioeconomic History  . Marital status: Married    Spouse name: Not on file  . Number of children: Not on file  . Years of education: Not on file  . Highest education level: Not on file  Occupational History  . Occupation: Retired  Scientific laboratory technician  . Financial resource strain: Not on file  . Food insecurity:    Worry: Not on file    Inability: Not on file  . Transportation needs:    Medical: Not on file    Non-medical: Not on file  Tobacco Use  . Smoking status: Former Research scientist (life sciences)  . Smokeless tobacco: Never Used  . Tobacco comment: quit 20-30 years ago  Substance and Sexual Activity  . Alcohol use: No  . Drug use: No  . Sexual activity: Not Currently  Lifestyle  . Physical activity:    Days per week: Not on file     Minutes per session: Not on file  . Stress: Not on file  Relationships  . Social connections:    Talks on phone: Not on file    Gets together: Not on file    Attends religious service: Not on file    Active member of club or organization: Not on file    Attends meetings of clubs or organizations: Not on file    Relationship status: Not on file  . Intimate partner violence:    Fear of current or ex partner: Not on file    Emotionally abused: Not on file    Physically abused: Not on file    Forced sexual activity: Not on file  Other Topics Concern  . Not on file  Social History Narrative  . Not on file    Current Outpatient Medications on File Prior to Visit  Medication Sig Dispense Refill  . aspirin 81 MG tablet Take 81 mg by mouth every evening.     . Cholecalciferol (VITAMIN D3) 1000 UNITS CAPS Take 1 capsule by mouth every evening.     . fluticasone (FLONASE) 50 MCG/ACT nasal spray Place 2 sprays into both nostrils daily  as needed for allergies.     Marland Kitchen glipiZIDE (GLUCOTROL) 10 MG tablet Take 10 mg by mouth once.     Marland Kitchen losartan (COZAAR) 25 MG tablet Take 25 mg by mouth daily.     . meclizine (ANTIVERT) 25 MG tablet Take 1 tablet (25 mg total) by mouth 3 (three) times daily as needed for dizziness. May cause drowsiness 30 tablet 0  . metFORMIN (GLUCOPHAGE) 500 MG tablet Take 1,000 mg by mouth 2 (two) times daily.    . simvastatin (ZOCOR) 20 MG tablet Take 20 mg by mouth every evening.     . Turmeric (CURCUMIN 95 PO) Take 1 tablet by mouth every evening.    . Turmeric 500 MG TABS Take 1,000 mg by mouth every evening.     No current facility-administered medications on file prior to visit.     Allergies  Allergen Reactions  . Bee Venom Hives and Swelling    Yellow Jacket  . Oxycodone Hcl Itching  . Penicillins     Did it involve swelling of the face/tongue/throat, SOB, or low BP? Unknown Did it involve sudden or severe rash/hives, skin peeling, or any reaction on the  inside of your mouth or nose? Unknown Did you need to seek medical attention at a hospital or doctor's office? Unknown When did it last happen?Over 10 years If all above answers are "NO", may proceed with cephalosporin use.     Family History  Problem Relation Age of Onset  . Cancer Other        FH of Lung Cancer  . Cancer Other        FH of Prostate Cancer  . Diabetes Other        Parent  . Heart disease Other        Parent    Review of Systems Denies depression and neck swelling.      Objective:   Physical Exam   outside test results are reviewed: TSH=7.2    Assessment & Plan:  Hypothyroidism: worse.  I have sent a prescription to your pharmacy, to increase synthroid MNG: Let's recheck the ultrasound.  you will receive a phone call, about a day and time for an appointment.

## 2018-09-02 ENCOUNTER — Telehealth: Payer: Self-pay | Admitting: Endocrinology

## 2018-09-02 MED ORDER — LEVOTHYROXINE SODIUM 75 MCG PO TABS: 75.0000 ug | ORAL_TABLET | Freq: Every day | ORAL | 3 refills | Status: DC

## 2018-09-02 NOTE — Telephone Encounter (Signed)
please contact patient: We got results.  Please increase levothyroxine to 75 mcg/day. I have sent a prescription to your pharmacy.

## 2018-09-02 NOTE — Telephone Encounter (Signed)
Called pt and made him aware. Verbalized acceptance and understanding. 

## 2018-09-02 NOTE — Addendum Note (Signed)
Addended by: Renato Shin on: 09/02/2018 03:42 PM   Modules accepted: Level of Service

## 2018-09-07 ENCOUNTER — Other Ambulatory Visit: Payer: Self-pay

## 2018-09-07 NOTE — Patient Outreach (Signed)
[facility]) Care Management  09/07/2018  Duane Stevens Jul 27, 1945 [id number]   Medication Adherence call to Duane Stevens Compliant Voice message left with a call back number. Duane Stevens is showing past due on Simvastatin 20 mg and Losartan 25 mg under [place].   [facility] Management Direct Dial [phone]  Fax [phone] [email or web address]

## 2018-09-22 ENCOUNTER — Other Ambulatory Visit: Payer: Self-pay

## 2018-09-22 NOTE — Patient Outreach (Signed)
[facility]) Care Management  09/22/2018  Viraj Liby September 23, 1945 [id number]   Medication Adherence call to Mr. [place] Compliant Voice message left with a call back number. Mr. Leeson is showing past due on Simvastatin 20 mg and Losartan 25 mg under [place].   [facility] Management Direct Dial [phone]  Fax [phone] [email or web address]

## 2018-11-25 ENCOUNTER — Other Ambulatory Visit: Payer: Self-pay

## 2018-11-25 DIAGNOSIS — Z20822 Contact with and (suspected) exposure to covid-19: Secondary | ICD-10-CM

## 2018-11-26 LAB — NOVEL CORONAVIRUS, NAA: SARS-CoV-2, NAA: NOT DETECTED

## 2018-12-09 ENCOUNTER — Other Ambulatory Visit: Payer: Self-pay

## 2018-12-09 ENCOUNTER — Telehealth: Payer: Self-pay | Admitting: Endocrinology

## 2018-12-09 DIAGNOSIS — E890 Postprocedural hypothyroidism: Secondary | ICD-10-CM

## 2018-12-09 MED ORDER — LEVOTHYROXINE SODIUM 75 MCG PO TABS: 75.0000 ug | ORAL_TABLET | Freq: Every day | ORAL | 3 refills | Status: DC

## 2018-12-09 NOTE — Telephone Encounter (Signed)
Please advise how you wish to proceed. LOV 09/01/18. It appears you wanted pt to repeat US. Not yet scheduled nor completed.

## 2018-12-09 NOTE — Telephone Encounter (Signed)
Please refill prn Please ask Duane Stevens to schedule [place], now that radiol has resumed doing

## 2018-12-09 NOTE — Telephone Encounter (Signed)
levothyroxine (SYNTHROID) 75 MCG tablet 90 tablet 3 12/09/2018    Sig - Route: Take 1 tablet (75 mcg total) by mouth daily. - Oral   Sent to pharmacy as: levothyroxine (SYNTHROID) 75 MCG tablet   E-Prescribing Status: Receipt confirmed by pharmacy (12/09/2018 11:29 AM EDT)    Please refer to Dr. Cordelia Pen request

## 2018-12-09 NOTE — Telephone Encounter (Signed)
MEDICATION: levothyroxine (SYNTHROID) 75 MCG tablet  PHARMACY:  [place], [place] - [address] [employer]  IS THIS A 90 DAY SUPPLY : YES  IS PATIENT OUT OF MEDICATION:   IF NOT; HOW MUCH IS LEFT: 30 days left   LAST APPOINTMENT DATE: @5 /14/2020  NEXT APPOINTMENT DATE:@Visit  date not found  DO WE HAVE YOUR PERMISSION TO LEAVE A DETAILED MESSAGE:  OTHER COMMENTS:    **Let patient know to contact pharmacy at the end of the day to make sure medication is ready. **  ** Please notify patient to allow 48-72 hours to process**  **Encourage patient to contact the pharmacy for refills or they can request refills through [facility]**

## 2018-12-14 NOTE — Telephone Encounter (Signed)
Pt is scheduled for 12/21/2018 at 230 arrival is at 210 at [place] pt is aware

## 2018-12-21 ENCOUNTER — Ambulatory Visit
Admission: RE | Admit: 2018-12-21 | Discharge: 2018-12-21 | Disposition: A | Discharge status: Home / Self Care | Payer: Medicare Other | Source: Ambulatory Visit | Attending: Endocrinology | Admitting: Endocrinology

## 2018-12-21 DIAGNOSIS — E041 Nontoxic single thyroid nodule: Secondary | ICD-10-CM | POA: Diagnosis not present

## 2018-12-21 DIAGNOSIS — E042 Nontoxic multinodular goiter: Secondary | ICD-10-CM

## 2019-02-10 ENCOUNTER — Telehealth: Payer: Self-pay

## 2019-02-10 ENCOUNTER — Telehealth: Payer: Self-pay | Admitting: Endocrinology

## 2019-02-10 NOTE — Telephone Encounter (Signed)
Please advise 

## 2019-02-10 NOTE — Telephone Encounter (Signed)
Returned call to advise about My Chart message below:  Patient Result Comments for US THYROID  Written by Renato Shin, MD on 12/22/2018 7:46 PM Hi Duane Stevens:  No change--good.

## 2019-02-10 NOTE — Telephone Encounter (Signed)
Patient called about results. Also wanting results sent to Dr Pleas [facility] [place]    Please call and advise

## 2019-02-10 NOTE — Telephone Encounter (Signed)
After the [place], I sent my chart message: No change.  Good.

## 2019-02-10 NOTE — Telephone Encounter (Signed)
Duane Stevens is calling to get the results from patient test on 12/21/2018 at [place].  Call back number is [phone]

## 2019-02-11 NOTE — Telephone Encounter (Signed)
Chart Review Routing History Since 02/12/2018 (View Full Routing History)  Recipients Sent On Sent By Routed Reports   Curlene Labrum, MD     02/11/2019 7:08 AM Aleatha Borer, LPN

## 2019-02-11 NOTE — Telephone Encounter (Signed)
SECOND ATTEMPT:  LVM to inform about below  Returned call to advise about My Chart message below:  Patient Result Comments for US THYROID  Written by Renato Shin, MD on 12/22/2018 7:46 PM Hi Mr. Otts:  No change--good.

## 2019-02-14 NOTE — Telephone Encounter (Signed)
Patient wife advised of My Chart note:   Written by Renato Shin, MD on 12/22/2018 7:46 PM Hi Mr. Hebda:  No change--good.  Verbalized understanding of information at this time

## 2019-02-18 DIAGNOSIS — E0500 Thyrotoxicosis with diffuse goiter without thyrotoxic crisis or storm: Secondary | ICD-10-CM | POA: Diagnosis not present

## 2019-02-18 DIAGNOSIS — E11311 Type 2 diabetes mellitus with unspecified diabetic retinopathy with macular edema: Secondary | ICD-10-CM | POA: Diagnosis not present

## 2019-02-18 DIAGNOSIS — E11319 Type 2 diabetes mellitus with unspecified diabetic retinopathy without macular edema: Secondary | ICD-10-CM | POA: Diagnosis not present

## 2019-02-18 DIAGNOSIS — E782 Mixed hyperlipidemia: Secondary | ICD-10-CM | POA: Diagnosis not present

## 2019-02-18 DIAGNOSIS — E1165 Type 2 diabetes mellitus with hyperglycemia: Secondary | ICD-10-CM | POA: Diagnosis not present

## 2019-02-18 DIAGNOSIS — I739 Peripheral vascular disease, unspecified: Secondary | ICD-10-CM | POA: Diagnosis not present

## 2019-02-18 DIAGNOSIS — E039 Hypothyroidism, unspecified: Secondary | ICD-10-CM | POA: Diagnosis not present

## 2019-02-23 DIAGNOSIS — Z23 Encounter for immunization: Secondary | ICD-10-CM | POA: Diagnosis not present

## 2019-02-23 DIAGNOSIS — E782 Mixed hyperlipidemia: Secondary | ICD-10-CM | POA: Diagnosis not present

## 2019-02-23 DIAGNOSIS — E11319 Type 2 diabetes mellitus with unspecified diabetic retinopathy without macular edema: Secondary | ICD-10-CM | POA: Diagnosis not present

## 2019-02-23 DIAGNOSIS — I5032 Chronic diastolic (congestive) heart failure: Secondary | ICD-10-CM | POA: Diagnosis not present

## 2019-02-23 DIAGNOSIS — Z0001 Encounter for general adult medical examination with abnormal findings: Secondary | ICD-10-CM | POA: Diagnosis not present

## 2019-02-23 DIAGNOSIS — E049 Nontoxic goiter, unspecified: Secondary | ICD-10-CM | POA: Diagnosis not present

## 2019-02-23 DIAGNOSIS — M19011 Primary osteoarthritis, right shoulder: Secondary | ICD-10-CM | POA: Diagnosis not present

## 2019-03-16 DIAGNOSIS — E039 Hypothyroidism, unspecified: Secondary | ICD-10-CM | POA: Diagnosis not present

## 2019-03-16 DIAGNOSIS — E049 Nontoxic goiter, unspecified: Secondary | ICD-10-CM | POA: Diagnosis not present

## 2019-03-16 DIAGNOSIS — M19011 Primary osteoarthritis, right shoulder: Secondary | ICD-10-CM | POA: Diagnosis not present

## 2019-03-16 DIAGNOSIS — M19012 Primary osteoarthritis, left shoulder: Secondary | ICD-10-CM | POA: Diagnosis not present

## 2019-03-16 DIAGNOSIS — M7501 Adhesive capsulitis of right shoulder: Secondary | ICD-10-CM | POA: Diagnosis not present

## 2019-03-21 DIAGNOSIS — I739 Peripheral vascular disease, unspecified: Secondary | ICD-10-CM | POA: Diagnosis not present

## 2019-03-21 DIAGNOSIS — E1151 Type 2 diabetes mellitus with diabetic peripheral angiopathy without gangrene: Secondary | ICD-10-CM | POA: Diagnosis not present

## 2019-05-16 DIAGNOSIS — L309 Dermatitis, unspecified: Secondary | ICD-10-CM | POA: Diagnosis not present

## 2019-05-19 DIAGNOSIS — E1122 Type 2 diabetes mellitus with diabetic chronic kidney disease: Secondary | ICD-10-CM | POA: Diagnosis not present

## 2019-05-19 DIAGNOSIS — E782 Mixed hyperlipidemia: Secondary | ICD-10-CM | POA: Diagnosis not present

## 2019-05-19 DIAGNOSIS — I5032 Chronic diastolic (congestive) heart failure: Secondary | ICD-10-CM | POA: Diagnosis not present

## 2019-05-19 DIAGNOSIS — E1165 Type 2 diabetes mellitus with hyperglycemia: Secondary | ICD-10-CM | POA: Diagnosis not present

## 2019-05-19 DIAGNOSIS — E039 Hypothyroidism, unspecified: Secondary | ICD-10-CM | POA: Diagnosis not present

## 2019-05-24 DIAGNOSIS — E039 Hypothyroidism, unspecified: Secondary | ICD-10-CM | POA: Diagnosis not present

## 2019-05-24 DIAGNOSIS — E1151 Type 2 diabetes mellitus with diabetic peripheral angiopathy without gangrene: Secondary | ICD-10-CM | POA: Diagnosis not present

## 2019-05-24 DIAGNOSIS — E11319 Type 2 diabetes mellitus with unspecified diabetic retinopathy without macular edema: Secondary | ICD-10-CM | POA: Diagnosis not present

## 2019-05-24 DIAGNOSIS — H524 Presbyopia: Secondary | ICD-10-CM | POA: Diagnosis not present

## 2019-05-24 DIAGNOSIS — I739 Peripheral vascular disease, unspecified: Secondary | ICD-10-CM | POA: Diagnosis not present

## 2019-05-24 DIAGNOSIS — E782 Mixed hyperlipidemia: Secondary | ICD-10-CM | POA: Diagnosis not present

## 2019-06-17 DIAGNOSIS — E7849 Other hyperlipidemia: Secondary | ICD-10-CM | POA: Diagnosis not present

## 2019-06-17 DIAGNOSIS — I10 Essential (primary) hypertension: Secondary | ICD-10-CM | POA: Diagnosis not present

## 2019-07-06 ENCOUNTER — Encounter (INDEPENDENT_AMBULATORY_CARE_PROVIDER_SITE_OTHER): Payer: Medicare Other | Admitting: Ophthalmology

## 2019-07-06 ENCOUNTER — Other Ambulatory Visit: Payer: Self-pay

## 2019-07-06 DIAGNOSIS — I10 Essential (primary) hypertension: Secondary | ICD-10-CM

## 2019-07-06 DIAGNOSIS — H43813 Vitreous degeneration, bilateral: Secondary | ICD-10-CM | POA: Diagnosis not present

## 2019-07-06 DIAGNOSIS — H2513 Age-related nuclear cataract, bilateral: Secondary | ICD-10-CM

## 2019-07-06 DIAGNOSIS — H33102 Unspecified retinoschisis, left eye: Secondary | ICD-10-CM | POA: Diagnosis not present

## 2019-07-06 DIAGNOSIS — H33303 Unspecified retinal break, bilateral: Secondary | ICD-10-CM | POA: Diagnosis not present

## 2019-07-06 DIAGNOSIS — H35033 Hypertensive retinopathy, bilateral: Secondary | ICD-10-CM | POA: Diagnosis not present

## 2019-07-26 DIAGNOSIS — L723 Sebaceous cyst: Secondary | ICD-10-CM | POA: Diagnosis not present

## 2019-07-26 DIAGNOSIS — B354 Tinea corporis: Secondary | ICD-10-CM | POA: Diagnosis not present

## 2019-08-24 DIAGNOSIS — I5032 Chronic diastolic (congestive) heart failure: Secondary | ICD-10-CM | POA: Diagnosis not present

## 2019-08-24 DIAGNOSIS — E1122 Type 2 diabetes mellitus with diabetic chronic kidney disease: Secondary | ICD-10-CM | POA: Diagnosis not present

## 2019-08-24 DIAGNOSIS — E039 Hypothyroidism, unspecified: Secondary | ICD-10-CM | POA: Diagnosis not present

## 2019-09-01 DIAGNOSIS — E11319 Type 2 diabetes mellitus with unspecified diabetic retinopathy without macular edema: Secondary | ICD-10-CM | POA: Diagnosis not present

## 2019-09-01 DIAGNOSIS — E1151 Type 2 diabetes mellitus with diabetic peripheral angiopathy without gangrene: Secondary | ICD-10-CM | POA: Diagnosis not present

## 2019-09-01 DIAGNOSIS — R0600 Dyspnea, unspecified: Secondary | ICD-10-CM | POA: Diagnosis not present

## 2019-09-01 DIAGNOSIS — E782 Mixed hyperlipidemia: Secondary | ICD-10-CM | POA: Diagnosis not present

## 2019-09-01 DIAGNOSIS — E039 Hypothyroidism, unspecified: Secondary | ICD-10-CM | POA: Diagnosis not present

## 2019-09-05 DIAGNOSIS — R0602 Shortness of breath: Secondary | ICD-10-CM | POA: Diagnosis not present

## 2019-09-30 ENCOUNTER — Encounter: Payer: Self-pay | Admitting: *Deleted

## 2019-10-03 ENCOUNTER — Encounter: Payer: Self-pay | Admitting: *Deleted

## 2019-10-03 ENCOUNTER — Other Ambulatory Visit: Payer: Self-pay

## 2019-10-03 ENCOUNTER — Encounter: Payer: Self-pay | Admitting: Cardiology

## 2019-10-03 ENCOUNTER — Ambulatory Visit: Payer: Medicare Other | Admitting: Cardiology

## 2019-10-03 VITALS — BP 154/76 | HR 92 | Ht 68.0 in | Wt 197.8 lb

## 2019-10-03 DIAGNOSIS — R600 Localized edema: Secondary | ICD-10-CM | POA: Diagnosis not present

## 2019-10-03 DIAGNOSIS — R0602 Shortness of breath: Secondary | ICD-10-CM | POA: Diagnosis not present

## 2019-10-03 MED ORDER — FUROSEMIDE 20 MG PO TABS: ORAL_TABLET | ORAL | 1 refills | Status: DC

## 2019-10-03 NOTE — Progress Notes (Signed)
Clinical Summary Duane Stevens is a 74 y.o.male seen as new consult, referred by Dr Kelby Aline for SOB and abnormal stress test.   1. SOB - pcp ordered a stress test at [facility] - 08/2019 Nuclear stress: apical infarct, LVEF 48%, no EKG changes, rated as intermediate risk. From exercise portion only went 2 min 48 sec. He reports was told to stop exercising   - no chest pain - DOE at 1 block or less for a few months - some recent LE edema x 1 year, with some increase - former smoker x 30 years.    Past Medical History:  Diagnosis Date   Allergy    Arthritis    Diabetes mellitus    Hypercholesterolemia    Hypertension    Hypothyroidism    Prostate cancer (HCC)    hx of   Thyroid disease      Allergies  Allergen Reactions   Bee Venom Hives and Swelling    Yellow Jacket   Oxycodone Hcl Itching   Penicillins     Did it involve swelling of the face/tongue/throat, SOB, or low BP? Unknown Did it involve sudden or severe rash/hives, skin peeling, or any reaction on the inside of your mouth or nose? Unknown Did you need to seek medical attention at a hospital or doctor's office? Unknown When did it last happen?Over 10 years If all above answers are NO, may proceed with cephalosporin use.      Current Outpatient Medications  Medication Sig Dispense Refill   aspirin 81 MG tablet Take 81 mg by mouth every evening.      Cholecalciferol (VITAMIN D3) 1000 UNITS CAPS Take 1 capsule by mouth every evening.      fluticasone (FLONASE) 50 MCG/ACT nasal spray Place 2 sprays into both nostrils daily as needed for allergies.      glipiZIDE (GLUCOTROL) 10 MG tablet Take 10 mg by mouth once.      levothyroxine (SYNTHROID) 75 MCG tablet Take 1 tablet (75 mcg total) by mouth daily. 90 tablet 3   losartan (COZAAR) 25 MG tablet Take 25 mg by mouth daily.      meclizine (ANTIVERT) 25 MG tablet Take 1 tablet (25 mg total) by mouth 3 (three) times daily as needed for  dizziness. May cause drowsiness 30 tablet 0   metFORMIN (GLUCOPHAGE) 500 MG tablet Take 1,000 mg by mouth 2 (two) times daily.     simvastatin (ZOCOR) 20 MG tablet Take 20 mg by mouth every evening.      Turmeric (CURCUMIN 95 PO) Take 1 tablet by mouth every evening.     Turmeric 500 MG TABS Take 1,000 mg by mouth every evening.     No current facility-administered medications for this visit.     Past Surgical History:  Procedure Laterality Date   ANKLE SURGERY     BACK SURGERY     HERNIA REPAIR     PROSTATECTOMY       Allergies  Allergen Reactions   Bee Venom Hives and Swelling    Yellow Jacket   Oxycodone Hcl Itching   Penicillins     Did it involve swelling of the face/tongue/throat, SOB, or low BP? Unknown Did it involve sudden or severe rash/hives, skin peeling, or any reaction on the inside of your mouth or nose? Unknown Did you need to seek medical attention at a hospital or doctor's office? Unknown When did it last happen?Over 10 years If all above answers are NO, may  proceed with cephalosporin use.       Family History  Problem Relation Age of Onset   Cancer Other        FH of Lung Cancer   Cancer Other        FH of Prostate Cancer   Diabetes Other        Parent   Heart disease Other        Parent     Social History Duane Stevens reports that he has quit smoking. He has never used smokeless tobacco. Duane Stevens reports no history of alcohol use.   Review of Systems CONSTITUTIONAL: No weight loss, fever, chills, weakness or fatigue.  HEENT: Eyes: No visual loss, blurred vision, double vision or yellow sclerae.No hearing loss, sneezing, congestion, runny nose or sore throat.  SKIN: No rash or itching.  CARDIOVASCULAR: per hpi RESPIRATORY: per hpi GASTROINTESTINAL: No anorexia, nausea, vomiting or diarrhea. No abdominal pain or blood.  GENITOURINARY: No burning on urination, no polyuria NEUROLOGICAL: No headache, dizziness,  syncope, paralysis, ataxia, numbness or tingling in the extremities. No change in bowel or bladder control.  MUSCULOSKELETAL: No muscle, back pain, joint pain or stiffness.  LYMPHATICS: No enlarged nodes. No history of splenectomy.  PSYCHIATRIC: No history of depression or anxiety.  ENDOCRINOLOGIC: No reports of sweating, cold or heat intolerance. No polyuria or polydipsia.  Marland Kitchen   Physical Examination Today's Vitals   10/03/19 0854  BP: (!) 154/76  Pulse: 92  SpO2: 98%  Weight: 197 lb 12.8 oz (89.7 kg)  Height: 5\' 8"  (1.727 m)   Body mass index is 30.08 kg/m.  Gen: resting comfortably, no acute distress HEENT: no scleral icterus, pupils equal round and reactive, no palptable cervical adenopathy,  CV": RRR, no m/r/g, no jvd Resp: Clear to auscultation bilaterally GI: abdomen is soft, non-tender, non-distended, normal bowel sounds, no hepatosplenomegaly MSK: extremities are warm, 2+ bilateral LE edema Skin: warm, no rash Neuro:  no focal deficits Psych: appropriate affect     Assessment and Plan  1. SOB - stress test without current ischemia, suggests mild LV systolic dysfunction, will further verify with an echo   2. LE edema - f/u echo - start lasix 40mg  x 5 days, then just prn      Arnoldo Lenis, M.D.

## 2019-10-03 NOTE — Patient Instructions (Signed)
Your physician recommends that you schedule a follow-up appointment in: [place] physician has recommended you make the following change in your medication:   START LASIX 20 MG DAILY FOR 5 DAYS THEN TAKE AS NEEDED  Your physician has requested that you have an echocardiogram. Echocardiography is a painless test that uses sound waves to create images of your heart. It provides your doctor with information about the size and shape of your heart and how well your heart's chambers and valves are working. This procedure takes approximately one hour. There are no restrictions for this procedure.  Your physician recommends that you return for lab work BMP/BNP/MG  Thank you for choosing [facility]!!

## 2019-10-05 ENCOUNTER — Ambulatory Visit (INDEPENDENT_AMBULATORY_CARE_PROVIDER_SITE_OTHER): Payer: Medicare Other

## 2019-10-05 DIAGNOSIS — R0602 Shortness of breath: Secondary | ICD-10-CM

## 2019-10-11 ENCOUNTER — Telehealth: Payer: Self-pay | Admitting: *Deleted

## 2019-10-11 NOTE — Telephone Encounter (Signed)
-----   Message from Arnoldo Lenis, MD sent at 10/11/2019  9:48 AM EDT ----- Normal echo. Overall heart testing has been fine, pcp will need to consider other causes of his symptoms, perhaps some lung testing. Can f/u with [place] as needed. He does need to get his labs that we discussed last visit   Zandra Abts MD

## 2019-10-11 NOTE — Telephone Encounter (Signed)
Pt voiced understanding - will have labs done next Monday - routed to pcp

## 2019-10-17 DIAGNOSIS — R0602 Shortness of breath: Secondary | ICD-10-CM | POA: Diagnosis not present

## 2019-10-19 DIAGNOSIS — I5032 Chronic diastolic (congestive) heart failure: Secondary | ICD-10-CM | POA: Diagnosis not present

## 2019-10-19 DIAGNOSIS — Z7984 Long term (current) use of oral hypoglycemic drugs: Secondary | ICD-10-CM | POA: Diagnosis not present

## 2019-10-19 DIAGNOSIS — E7849 Other hyperlipidemia: Secondary | ICD-10-CM | POA: Diagnosis not present

## 2019-10-19 DIAGNOSIS — E1165 Type 2 diabetes mellitus with hyperglycemia: Secondary | ICD-10-CM | POA: Diagnosis not present

## 2019-10-19 DIAGNOSIS — I110 Hypertensive heart disease with heart failure: Secondary | ICD-10-CM | POA: Diagnosis not present

## 2019-11-03 ENCOUNTER — Encounter: Payer: Self-pay | Admitting: Cardiology

## 2019-11-03 ENCOUNTER — Ambulatory Visit (INDEPENDENT_AMBULATORY_CARE_PROVIDER_SITE_OTHER): Payer: Medicare Other | Admitting: Cardiology

## 2019-11-03 VITALS — BP 118/58 | HR 84 | Ht 68.0 in | Wt 194.6 lb

## 2019-11-03 DIAGNOSIS — R0602 Shortness of breath: Secondary | ICD-10-CM | POA: Diagnosis not present

## 2019-11-03 NOTE — Progress Notes (Signed)
Clinical Summary Duane Stevens is a 74 y.o.male seen today for follow up of the following medical problems.    1. SOB - pcp ordered a stress test at [facility] - 08/2019 Nuclear stress: apical infarct, LVEF 48%, no EKG changes, rated as intermediate risk. From exercise portion only went 2 min 48 sec. He reports was told to stop exercising   - no chest pain - DOE at 1 block or less for a few months - some recent LE edema x 1 year, with some increase - former smoker x 30 years.   09/2019 echo: LVEF 60-65%, indet DDx,   Past Medical History:  Diagnosis Date  . Allergy   . Arthritis   . Diabetes mellitus   . Hypercholesterolemia   . Hypertension   . Hypothyroidism   . Prostate cancer ([place])    hx of  . Thyroid disease      Allergies  Allergen Reactions  . Bee Venom Hives and Swelling    Yellow Jacket  . Oxycodone Hcl Itching  . Penicillins     Did it involve swelling of the face/tongue/throat, SOB, or low BP? Unknown Did it involve sudden or severe rash/hives, skin peeling, or any reaction on the inside of your mouth or nose? Unknown Did you need to seek medical attention at a hospital or doctor's office? Unknown When did it last happen?Over 10 years If all above answers are "NO", may proceed with cephalosporin use.      Current Outpatient Medications  Medication Sig Dispense Refill  . aspirin 81 MG tablet Take 81 mg by mouth every evening.     . Cholecalciferol (VITAMIN D3) 1000 UNITS CAPS Take 1 capsule by mouth every evening.     . fluticasone (FLONASE) 50 MCG/ACT nasal spray Place 2 sprays into both nostrils daily as needed for allergies.     . furosemide (LASIX) 20 MG tablet TAKE 1 TABLET DAILY FOR 5 DAYS THEN TAKE AS NEEDED 90 tablet 1  . glipiZIDE (GLUCOTROL XL) 2.5 MG 24 hr tablet Take 2.5 mg by mouth daily.    Marland Kitchen levothyroxine (SYNTHROID) 75 MCG tablet Take 1 tablet (75 mcg total) by mouth daily. 90 tablet 3  . loratadine (CLARITIN) 10 MG tablet Take  10 mg by mouth daily.    Marland Kitchen losartan (COZAAR) 25 MG tablet Take 25 mg by mouth daily.     . meclizine (ANTIVERT) 25 MG tablet Take 1 tablet (25 mg total) by mouth 3 (three) times daily as needed for dizziness. May cause drowsiness 30 tablet 0  . metFORMIN (GLUCOPHAGE) 500 MG tablet Take 1,000 mg by mouth 2 (two) times daily.    . simvastatin (ZOCOR) 20 MG tablet Take 20 mg by mouth every evening.     . Turmeric 500 MG TABS Take 500 mg by mouth every evening.      No current facility-administered medications for this visit.     Past Surgical History:  Procedure Laterality Date  . ANKLE SURGERY    . BACK SURGERY    . HERNIA REPAIR    . PROSTATECTOMY       Allergies  Allergen Reactions  . Bee Venom Hives and Swelling    Yellow Jacket  . Oxycodone Hcl Itching  . Penicillins     Did it involve swelling of the face/tongue/throat, SOB, or low BP? Unknown Did it involve sudden or severe rash/hives, skin peeling, or any reaction on the inside of your mouth or nose? Unknown  Did you need to seek medical attention at a hospital or doctor's office? Unknown When did it last happen?Over 10 years If all above answers are "NO", may proceed with cephalosporin use.       Family History  Problem Relation Age of Onset  . Cancer Other        FH of Lung Cancer  . Cancer Other        FH of Prostate Cancer  . Diabetes Other        Parent  . Heart disease Other        Parent     Social History Mr. Dase reports that he has quit smoking. He has never used smokeless tobacco. Mr. Valeriano reports no history of alcohol use.   Review of Systems CONSTITUTIONAL: No weight loss, fever, chills, weakness or fatigue.  HEENT: Eyes: No visual loss, blurred vision, double vision or yellow sclerae.No hearing loss, sneezing, congestion, runny nose or sore throat.  SKIN: No rash or itching.  CARDIOVASCULAR: per hpi RESPIRATORY: per hpi GASTROINTESTINAL: No anorexia, nausea, vomiting or  diarrhea. No abdominal pain or blood.  GENITOURINARY: No burning on urination, no polyuria NEUROLOGICAL: No headache, dizziness, syncope, paralysis, ataxia, numbness or tingling in the extremities. No change in bowel or bladder control.  MUSCULOSKELETAL: No muscle, back pain, joint pain or stiffness.  LYMPHATICS: No enlarged nodes. No history of splenectomy.  PSYCHIATRIC: No history of depression or anxiety.  ENDOCRINOLOGIC: No reports of sweating, cold or heat intolerance. No polyuria or polydipsia.  Marland Kitchen   Physical Examination Today's Vitals   11/03/19 1534  BP: (!) 118/58  Pulse: 84  SpO2: 95%  Weight: 194 lb 9.6 oz (88.3 kg)  Height: 5\' 8"  (1.727 m)   Body mass index is 29.59 kg/m.  Gen: resting comfortably, no acute distress HEENT: no scleral icterus, pupils equal round and reactive, no palptable cervical adenopathy,  CV: RRR, no m/r/g, no jvd Resp: Clear to auscultation bilaterally GI: abdomen is soft, non-tender, non-distended, normal bowel sounds, no hepatosplenomegaly MSK: extremities are warm, no edema.  Skin: warm, no rash Neuro:  no focal deficits Psych: appropriate affect   Diagnostic Studies  09/2019 echo IMPRESSIONS    1. Left ventricular ejection fraction, by estimation, is 60 to 65%. The  left ventricle has normal function. The left ventricle has no regional  wall motion abnormalities. Left ventricular diastolic parameters are  indeterminate.  2. Right ventricular systolic function is normal. The right ventricular  size is normal. There is normal pulmonary artery systolic pressure.  3. The mitral valve is normal in structure. No evidence of mitral valve  regurgitation. No evidence of mitral stenosis.  4. The aortic valve is tricuspid. Aortic valve regurgitation is not  visualized. No aortic stenosis is present.  5. The inferior vena cava is normal in size with greater than 50%  respiratory variability, suggesting right atrial pressure of 3 mmHg.     Assessment and Plan  1. SOB - stress test without current ischemia - benign echo - no evidence of cardiac etiology for symptos - can take lasix 20mg  as needed for LE edema     Arnoldo Lenis, M.D

## 2019-11-03 NOTE — Patient Instructions (Signed)

## 2019-11-18 DIAGNOSIS — E7849 Other hyperlipidemia: Secondary | ICD-10-CM | POA: Diagnosis not present

## 2019-11-18 DIAGNOSIS — I110 Hypertensive heart disease with heart failure: Secondary | ICD-10-CM | POA: Diagnosis not present

## 2019-11-18 DIAGNOSIS — Z7984 Long term (current) use of oral hypoglycemic drugs: Secondary | ICD-10-CM | POA: Diagnosis not present

## 2019-11-18 DIAGNOSIS — I5032 Chronic diastolic (congestive) heart failure: Secondary | ICD-10-CM | POA: Diagnosis not present

## 2019-11-18 DIAGNOSIS — E1165 Type 2 diabetes mellitus with hyperglycemia: Secondary | ICD-10-CM | POA: Diagnosis not present

## 2019-12-20 DIAGNOSIS — E1165 Type 2 diabetes mellitus with hyperglycemia: Secondary | ICD-10-CM | POA: Diagnosis not present

## 2019-12-20 DIAGNOSIS — Z7984 Long term (current) use of oral hypoglycemic drugs: Secondary | ICD-10-CM | POA: Diagnosis not present

## 2019-12-20 DIAGNOSIS — I110 Hypertensive heart disease with heart failure: Secondary | ICD-10-CM | POA: Diagnosis not present

## 2019-12-20 DIAGNOSIS — I5032 Chronic diastolic (congestive) heart failure: Secondary | ICD-10-CM | POA: Diagnosis not present

## 2020-01-19 DIAGNOSIS — I5032 Chronic diastolic (congestive) heart failure: Secondary | ICD-10-CM | POA: Diagnosis not present

## 2020-01-19 DIAGNOSIS — Z7984 Long term (current) use of oral hypoglycemic drugs: Secondary | ICD-10-CM | POA: Diagnosis not present

## 2020-01-19 DIAGNOSIS — I110 Hypertensive heart disease with heart failure: Secondary | ICD-10-CM | POA: Diagnosis not present

## 2020-01-19 DIAGNOSIS — E1165 Type 2 diabetes mellitus with hyperglycemia: Secondary | ICD-10-CM | POA: Diagnosis not present

## 2020-01-25 ENCOUNTER — Other Ambulatory Visit: Payer: Self-pay | Admitting: Endocrinology

## 2020-01-25 DIAGNOSIS — E890 Postprocedural hypothyroidism: Secondary | ICD-10-CM

## 2020-02-07 DIAGNOSIS — Z23 Encounter for immunization: Secondary | ICD-10-CM | POA: Diagnosis not present

## 2020-02-18 DIAGNOSIS — I5032 Chronic diastolic (congestive) heart failure: Secondary | ICD-10-CM | POA: Diagnosis not present

## 2020-02-18 DIAGNOSIS — E1165 Type 2 diabetes mellitus with hyperglycemia: Secondary | ICD-10-CM | POA: Diagnosis not present

## 2020-02-18 DIAGNOSIS — I110 Hypertensive heart disease with heart failure: Secondary | ICD-10-CM | POA: Diagnosis not present

## 2020-02-18 DIAGNOSIS — Z7984 Long term (current) use of oral hypoglycemic drugs: Secondary | ICD-10-CM | POA: Diagnosis not present

## 2020-03-03 DIAGNOSIS — W312XXA Contact with powered woodworking and forming machines, initial encounter: Secondary | ICD-10-CM | POA: Diagnosis not present

## 2020-03-03 DIAGNOSIS — S61311A Laceration without foreign body of left index finger with damage to nail, initial encounter: Secondary | ICD-10-CM | POA: Diagnosis not present

## 2020-03-03 DIAGNOSIS — M795 Residual foreign body in soft tissue: Secondary | ICD-10-CM | POA: Diagnosis not present

## 2020-03-03 DIAGNOSIS — Z1811 Retained magnetic metal fragments: Secondary | ICD-10-CM | POA: Diagnosis not present

## 2020-03-03 DIAGNOSIS — S61012A Laceration without foreign body of left thumb without damage to nail, initial encounter: Secondary | ICD-10-CM | POA: Diagnosis not present

## 2020-03-03 DIAGNOSIS — S62631A Displaced fracture of distal phalanx of left index finger, initial encounter for closed fracture: Secondary | ICD-10-CM | POA: Diagnosis not present

## 2020-03-03 DIAGNOSIS — S62631B Displaced fracture of distal phalanx of left index finger, initial encounter for open fracture: Secondary | ICD-10-CM | POA: Diagnosis not present

## 2020-03-03 NOTE — ED Provider Notes (Signed)
 Emergency Department Provider Note    ED Clinical Impression   Final diagnoses:  Laceration of left thumb without damage to nail, foreign body presence unspecified, initial encounter (Primary)  Laceration of left index finger with damage to nail, foreign body presence unspecified, initial encounter    ED Assessment/Plan    History   Chief Complaint  Patient presents with  . Laceration   74 year old gentleman here with table saw injury to the left index fingertip and left thumb tip.  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   His fingers got caught into the sawblade with injuries to the tips of his left thumb and left index finger.  Patient states he is current on his tetanus, had it 2 years ago.       No past medical history on file.  No past surgical history on file.  No family history on file.  Social History   Socioeconomic History  . Marital status: Married    Spouse name: Not on file  . Number of children: Not on file  . Years of education: Not on file  . Highest education level: Not on file  Occupational History  . Not on file  Tobacco Use  . Smoking status: Not on file  . Smokeless tobacco: Not on file  Substance and Sexual Activity  . Alcohol use: Not on file  . Drug use: Not on file  . Sexual activity: Not on file  Other Topics Concern  . Not on file  Social History Narrative  . Not on file   Social Determinants of Health   Financial Resource Strain: Not on file  Food Insecurity: Not on file  Transportation Needs: Not on file  Physical Activity: Not on file  Stress: Not on file  Social Connections: Not on file    Review of Systems  Skin:       Lacerations to the tip of the left index finger and left thumb from a table saw  All other systems reviewed and are negative.   Physical Exam   BP 146/71   Pulse 98   Temp 36.4 C (97.6 F) (Tympanic)   Resp 18   Ht 175.3 cm (5' 9)    Wt 88.9 kg (196 lb)   SpO2 100%   BMI 28.94 kg/m   Physical Exam Vitals and nursing note reviewed.  Cardiovascular:     Rate and Rhythm: Regular rhythm.  Pulmonary:     Breath sounds: Normal breath sounds.  Musculoskeletal:        General: Tenderness, deformity and signs of injury present.     Comments: See photos.  Thumb has a wedge of skin missing from the volar pad. Index has a small laceration to the ulnar side of the tip but it did cause a transverse nail injury.  Skin:    Capillary Refill: Capillary refill takes less than 2 seconds.  Neurological:     Mental Status: He is alert.     Sensory: No sensory deficit.  Psychiatric:        Mood and Affect: Mood normal.     ED Course  Photos of left hand/fingers:          Hand was copiously cleaned with 1000cc of saline with betadine.   Fingers flushed and cleaned.    1% plain lidocaine into both the thumb and index finger performing a digital block with a total of 8 cc used for both  digits.  After satisfactory anesthesia was obtained, the fingers were again cleaned with additional 1000 cc of saline.  Utilizing 4-0 Surgipro, the thumb and index finger wounds were sutured.  For the thumb the margins were approximated with a total of 4 simple interrupted sutures with excellent approximation of the tissue.  Xeroform gauze and then sterile Kerlix and tube gauze was applied.  For the index finger, a reverse suture starting through the nail and then out the matrix was used to approximate the nail injury margins.  Then 2 additional simple interrupted sutures were placed on the volar and ulnar side of the wound with excellent approximation of those margins as well.  Xeroform gauze, sterile Kerlix and tube gauze was applied.  Procedure was tolerated well.  A bulky hand dressing was applied. Home care with emphasis on keeping the dressing clean and dry was discussed.  Return here in 2 days for recheck.  Sutures out in 10 days.  Keflex 500  mg 4 times daily at home.  He did get Ancef  1 g IV here in the ED.  He was current on tetanus so this did not need update.   Coding   Sherwood Gwenn Mace, [place] 03/03/20 2218

## 2020-03-05 DIAGNOSIS — E119 Type 2 diabetes mellitus without complications: Secondary | ICD-10-CM | POA: Diagnosis not present

## 2020-03-05 DIAGNOSIS — E079 Disorder of thyroid, unspecified: Secondary | ICD-10-CM | POA: Diagnosis not present

## 2020-03-05 DIAGNOSIS — Z4800 Encounter for change or removal of nonsurgical wound dressing: Secondary | ICD-10-CM | POA: Diagnosis not present

## 2020-03-05 DIAGNOSIS — I10 Essential (primary) hypertension: Secondary | ICD-10-CM | POA: Diagnosis not present

## 2020-03-05 DIAGNOSIS — S61012D Laceration without foreign body of left thumb without damage to nail, subsequent encounter: Secondary | ICD-10-CM | POA: Diagnosis not present

## 2020-03-05 DIAGNOSIS — S61211D Laceration without foreign body of left index finger without damage to nail, subsequent encounter: Secondary | ICD-10-CM | POA: Diagnosis not present

## 2020-03-05 DIAGNOSIS — Z87891 Personal history of nicotine dependence: Secondary | ICD-10-CM | POA: Diagnosis not present

## 2020-03-08 DIAGNOSIS — W312XXA Contact with powered woodworking and forming machines, initial encounter: Secondary | ICD-10-CM | POA: Diagnosis not present

## 2020-03-08 DIAGNOSIS — S62639B Displaced fracture of distal phalanx of unspecified finger, initial encounter for open fracture: Secondary | ICD-10-CM | POA: Diagnosis not present

## 2020-03-13 DIAGNOSIS — E039 Hypothyroidism, unspecified: Secondary | ICD-10-CM | POA: Diagnosis not present

## 2020-03-13 DIAGNOSIS — E1122 Type 2 diabetes mellitus with diabetic chronic kidney disease: Secondary | ICD-10-CM | POA: Diagnosis not present

## 2020-03-13 DIAGNOSIS — E782 Mixed hyperlipidemia: Secondary | ICD-10-CM | POA: Diagnosis not present

## 2020-03-13 DIAGNOSIS — S6992XD Unspecified injury of left wrist, hand and finger(s), subsequent encounter: Secondary | ICD-10-CM | POA: Diagnosis not present

## 2020-03-13 DIAGNOSIS — Z0001 Encounter for general adult medical examination with abnormal findings: Secondary | ICD-10-CM | POA: Diagnosis not present

## 2020-03-13 DIAGNOSIS — Z4802 Encounter for removal of sutures: Secondary | ICD-10-CM | POA: Diagnosis not present

## 2020-03-19 DIAGNOSIS — I5032 Chronic diastolic (congestive) heart failure: Secondary | ICD-10-CM | POA: Diagnosis not present

## 2020-03-19 DIAGNOSIS — E782 Mixed hyperlipidemia: Secondary | ICD-10-CM | POA: Diagnosis not present

## 2020-03-19 DIAGNOSIS — Z0001 Encounter for general adult medical examination with abnormal findings: Secondary | ICD-10-CM | POA: Diagnosis not present

## 2020-03-19 DIAGNOSIS — E1122 Type 2 diabetes mellitus with diabetic chronic kidney disease: Secondary | ICD-10-CM | POA: Diagnosis not present

## 2020-03-20 DIAGNOSIS — E1165 Type 2 diabetes mellitus with hyperglycemia: Secondary | ICD-10-CM | POA: Diagnosis not present

## 2020-03-20 DIAGNOSIS — I110 Hypertensive heart disease with heart failure: Secondary | ICD-10-CM | POA: Diagnosis not present

## 2020-03-20 DIAGNOSIS — I5032 Chronic diastolic (congestive) heart failure: Secondary | ICD-10-CM | POA: Diagnosis not present

## 2020-03-20 DIAGNOSIS — Z7984 Long term (current) use of oral hypoglycemic drugs: Secondary | ICD-10-CM | POA: Diagnosis not present

## 2020-04-03 DIAGNOSIS — W312XXA Contact with powered woodworking and forming machines, initial encounter: Secondary | ICD-10-CM | POA: Diagnosis not present

## 2020-04-03 DIAGNOSIS — S60932A Unspecified superficial injury of left thumb, initial encounter: Secondary | ICD-10-CM | POA: Diagnosis not present

## 2020-04-03 DIAGNOSIS — Y9389 Activity, other specified: Secondary | ICD-10-CM | POA: Diagnosis not present

## 2020-04-03 DIAGNOSIS — Y92019 Unspecified place in single-family (private) house as the place of occurrence of the external cause: Secondary | ICD-10-CM | POA: Diagnosis not present

## 2020-04-05 ENCOUNTER — Ambulatory Visit: Payer: Medicare Other | Admitting: Endocrinology

## 2020-04-05 ENCOUNTER — Other Ambulatory Visit: Payer: Self-pay

## 2020-04-05 ENCOUNTER — Encounter: Payer: Self-pay | Admitting: Endocrinology

## 2020-04-05 VITALS — BP 124/68 | HR 68 | Ht 69.0 in | Wt 199.0 lb

## 2020-04-05 DIAGNOSIS — E890 Postprocedural hypothyroidism: Secondary | ICD-10-CM

## 2020-04-05 LAB — T4, FREE: Free T4: 0.71 ng/dL (ref 0.60–1.60)

## 2020-04-05 LAB — TSH: TSH: 5.14 u[IU]/mL — ABNORMAL HIGH (ref 0.35–4.50)

## 2020-04-05 MED ORDER — LEVOTHYROXINE SODIUM 100 MCG PO TABS: 100.0000 ug | ORAL_TABLET | Freq: Every day | ORAL | 3 refills | Status: DC

## 2020-04-05 NOTE — Progress Notes (Signed)
Subjective:    Patient ID: Duane Stevens, male    DOB: March 13, 1946, 74 y.o.   MRN: [id number]  HPI Pt returns for f/u of MNG and post-RAI hypothyroidism (he had RAI for hyperthyroidism in 2012; f/u US in 2017 was unchanged, and no f/u was advised; he has never had bx; he was euthyroid until 2017, when he developed hypothyroidism).   He reports cold intolerance.   Past Medical History:  Diagnosis Date  . Allergy   . Arthritis   . Diabetes mellitus   . Hypercholesterolemia   . Hypertension   . Hypothyroidism   . Prostate cancer ([place])    hx of  . Thyroid disease     Past Surgical History:  Procedure Laterality Date  . ANKLE SURGERY    . BACK SURGERY    . HERNIA REPAIR    . PROSTATECTOMY      Social History   Socioeconomic History  . Marital status: Married    Spouse name: Not on file  . Number of children: Not on file  . Years of education: Not on file  . Highest education level: Not on file  Occupational History  . Occupation: Retired  Tobacco Use  . Smoking status: Former Research scientist (life sciences)  . Smokeless tobacco: Never Used  . Tobacco comment: quit 20-30 years ago  Vaping Use  . Vaping Use: Never used  Substance and Sexual Activity  . Alcohol use: No  . Drug use: No  . Sexual activity: Not Currently  Other Topics Concern  . Not on file  Social History Narrative  . Not on file   Social Determinants of Health   Financial Resource Strain: Not on file  Food Insecurity: Not on file  Transportation Needs: Not on file  Physical Activity: Not on file  Stress: Not on file  Social Connections: Not on file  Intimate Partner Violence: Not on file    Current Outpatient Medications on File Prior to Visit  Medication Sig Dispense Refill  . aspirin 81 MG tablet Take 81 mg by mouth every evening.    . Cholecalciferol (VITAMIN D3) 1000 UNITS CAPS Take 1 capsule by mouth every evening.    . fluticasone (FLONASE) 50 MCG/ACT nasal spray Place 2 sprays into both nostrils daily as  needed for allergies.     . furosemide (LASIX) 20 MG tablet Take 20-40 mg by mouth daily as needed (swelling).    Marland Kitchen glipiZIDE (GLUCOTROL XL) 2.5 MG 24 hr tablet Take 2.5 mg by mouth daily.    Marland Kitchen losartan (COZAAR) 25 MG tablet Take 25 mg by mouth daily.     . meclizine (ANTIVERT) 25 MG tablet Take 1 tablet (25 mg total) by mouth 3 (three) times daily as needed for dizziness. May cause drowsiness 30 tablet 0  . metFORMIN (GLUCOPHAGE) 500 MG tablet Take 1,000 mg by mouth 2 (two) times daily.    . simvastatin (ZOCOR) 20 MG tablet Take 20 mg by mouth every evening.    . Turmeric 500 MG TABS Take 500 mg by mouth every evening.      No current facility-administered medications on file prior to visit.    Allergies  Allergen Reactions  . Bee Venom Hives and Swelling    Yellow Jacket  . Oxycodone Hcl Itching  . Penicillins     Did it involve swelling of the face/tongue/throat, SOB, or low BP? Unknown Did it involve sudden or severe rash/hives, skin peeling, or any reaction on the inside of your mouth or  nose? Unknown Did you need to seek medical attention at a hospital or doctor's office? Unknown When did it last happen?Over 10 years If all above answers are "NO", may proceed with cephalosporin use.     Family History  Problem Relation Age of Onset  . Cancer Other        FH of Lung Cancer  . Cancer Other        FH of Prostate Cancer  . Diabetes Other        Parent  . Heart disease Other        Parent    BP 124/68   Pulse 68   Ht 5\' 9"  (1.753 m)   Wt 199 lb (90.3 kg)   SpO2 98%   BMI 29.39 kg/m    Review of Systems     Objective:   Physical Exam VITAL SIGNS:  See vs page GENERAL: no distress Neck: right thyroid nodule (2 cm) is again noted. It is freely mobile.   Lab Results  Component Value Date   TSH 5.14 (H) 04/05/2020       Assessment & Plan:  Hypothyroidism: uncontrolled.  I have sent a prescription to your pharmacy, to increase synthroid.

## 2020-04-05 NOTE — Patient Instructions (Signed)
Blood tests are requested for you today.  We'll let you know about the results.  Please come back for a follow-up appointment in 1 year.    

## 2020-04-20 DIAGNOSIS — I110 Hypertensive heart disease with heart failure: Secondary | ICD-10-CM | POA: Diagnosis not present

## 2020-04-20 DIAGNOSIS — I5032 Chronic diastolic (congestive) heart failure: Secondary | ICD-10-CM | POA: Diagnosis not present

## 2020-04-20 DIAGNOSIS — E1165 Type 2 diabetes mellitus with hyperglycemia: Secondary | ICD-10-CM | POA: Diagnosis not present

## 2020-04-20 DIAGNOSIS — Z7984 Long term (current) use of oral hypoglycemic drugs: Secondary | ICD-10-CM | POA: Diagnosis not present

## 2020-05-19 DIAGNOSIS — E7849 Other hyperlipidemia: Secondary | ICD-10-CM | POA: Diagnosis not present

## 2020-05-19 DIAGNOSIS — I5032 Chronic diastolic (congestive) heart failure: Secondary | ICD-10-CM | POA: Diagnosis not present

## 2020-05-19 DIAGNOSIS — I110 Hypertensive heart disease with heart failure: Secondary | ICD-10-CM | POA: Diagnosis not present

## 2020-05-19 DIAGNOSIS — E1165 Type 2 diabetes mellitus with hyperglycemia: Secondary | ICD-10-CM | POA: Diagnosis not present

## 2020-05-19 DIAGNOSIS — Z7984 Long term (current) use of oral hypoglycemic drugs: Secondary | ICD-10-CM | POA: Diagnosis not present

## 2020-06-13 DIAGNOSIS — E7849 Other hyperlipidemia: Secondary | ICD-10-CM | POA: Diagnosis not present

## 2020-06-13 DIAGNOSIS — E782 Mixed hyperlipidemia: Secondary | ICD-10-CM | POA: Diagnosis not present

## 2020-06-13 DIAGNOSIS — E1122 Type 2 diabetes mellitus with diabetic chronic kidney disease: Secondary | ICD-10-CM | POA: Diagnosis not present

## 2020-06-13 DIAGNOSIS — E039 Hypothyroidism, unspecified: Secondary | ICD-10-CM | POA: Diagnosis not present

## 2020-06-13 DIAGNOSIS — E1165 Type 2 diabetes mellitus with hyperglycemia: Secondary | ICD-10-CM | POA: Diagnosis not present

## 2020-06-18 DIAGNOSIS — I110 Hypertensive heart disease with heart failure: Secondary | ICD-10-CM | POA: Diagnosis not present

## 2020-06-18 DIAGNOSIS — E1165 Type 2 diabetes mellitus with hyperglycemia: Secondary | ICD-10-CM | POA: Diagnosis not present

## 2020-06-18 DIAGNOSIS — Z7984 Long term (current) use of oral hypoglycemic drugs: Secondary | ICD-10-CM | POA: Diagnosis not present

## 2020-06-18 DIAGNOSIS — I5032 Chronic diastolic (congestive) heart failure: Secondary | ICD-10-CM | POA: Diagnosis not present

## 2020-06-18 DIAGNOSIS — E7849 Other hyperlipidemia: Secondary | ICD-10-CM | POA: Diagnosis not present

## 2020-06-19 DIAGNOSIS — E039 Hypothyroidism, unspecified: Secondary | ICD-10-CM | POA: Diagnosis not present

## 2020-06-19 DIAGNOSIS — E1122 Type 2 diabetes mellitus with diabetic chronic kidney disease: Secondary | ICD-10-CM | POA: Diagnosis not present

## 2020-06-19 DIAGNOSIS — E7849 Other hyperlipidemia: Secondary | ICD-10-CM | POA: Diagnosis not present

## 2020-06-19 DIAGNOSIS — R600 Localized edema: Secondary | ICD-10-CM | POA: Diagnosis not present

## 2020-06-19 DIAGNOSIS — I739 Peripheral vascular disease, unspecified: Secondary | ICD-10-CM | POA: Diagnosis not present

## 2020-06-19 DIAGNOSIS — I5032 Chronic diastolic (congestive) heart failure: Secondary | ICD-10-CM | POA: Diagnosis not present

## 2020-07-05 ENCOUNTER — Encounter (INDEPENDENT_AMBULATORY_CARE_PROVIDER_SITE_OTHER): Payer: Medicare Other | Admitting: Ophthalmology

## 2020-07-05 ENCOUNTER — Other Ambulatory Visit: Payer: Self-pay

## 2020-07-05 DIAGNOSIS — H43813 Vitreous degeneration, bilateral: Secondary | ICD-10-CM

## 2020-07-05 DIAGNOSIS — H33102 Unspecified retinoschisis, left eye: Secondary | ICD-10-CM | POA: Diagnosis not present

## 2020-07-05 DIAGNOSIS — H2513 Age-related nuclear cataract, bilateral: Secondary | ICD-10-CM

## 2020-07-05 DIAGNOSIS — H33301 Unspecified retinal break, right eye: Secondary | ICD-10-CM | POA: Diagnosis not present

## 2020-07-18 DIAGNOSIS — E7849 Other hyperlipidemia: Secondary | ICD-10-CM | POA: Diagnosis not present

## 2020-07-18 DIAGNOSIS — I5032 Chronic diastolic (congestive) heart failure: Secondary | ICD-10-CM | POA: Diagnosis not present

## 2020-07-18 DIAGNOSIS — E1165 Type 2 diabetes mellitus with hyperglycemia: Secondary | ICD-10-CM | POA: Diagnosis not present

## 2020-07-18 DIAGNOSIS — Z7984 Long term (current) use of oral hypoglycemic drugs: Secondary | ICD-10-CM | POA: Diagnosis not present

## 2020-07-18 DIAGNOSIS — I110 Hypertensive heart disease with heart failure: Secondary | ICD-10-CM | POA: Diagnosis not present

## 2020-07-23 ENCOUNTER — Other Ambulatory Visit: Payer: Self-pay | Admitting: *Deleted

## 2020-07-23 DIAGNOSIS — E890 Postprocedural hypothyroidism: Secondary | ICD-10-CM

## 2020-07-23 MED ORDER — LEVOTHYROXINE SODIUM 100 MCG PO TABS: 100.0000 ug | ORAL_TABLET | Freq: Every day | ORAL | 3 refills | Status: DC

## 2020-08-26 ENCOUNTER — Other Ambulatory Visit: Payer: Self-pay | Admitting: Cardiology

## 2020-09-06 DIAGNOSIS — J209 Acute bronchitis, unspecified: Secondary | ICD-10-CM | POA: Diagnosis not present

## 2020-09-17 DIAGNOSIS — E1165 Type 2 diabetes mellitus with hyperglycemia: Secondary | ICD-10-CM | POA: Diagnosis not present

## 2020-09-17 DIAGNOSIS — E7849 Other hyperlipidemia: Secondary | ICD-10-CM | POA: Diagnosis not present

## 2020-09-17 DIAGNOSIS — I5032 Chronic diastolic (congestive) heart failure: Secondary | ICD-10-CM | POA: Diagnosis not present

## 2020-09-17 DIAGNOSIS — I110 Hypertensive heart disease with heart failure: Secondary | ICD-10-CM | POA: Diagnosis not present

## 2020-09-17 DIAGNOSIS — Z7984 Long term (current) use of oral hypoglycemic drugs: Secondary | ICD-10-CM | POA: Diagnosis not present

## 2020-09-20 DIAGNOSIS — I739 Peripheral vascular disease, unspecified: Secondary | ICD-10-CM | POA: Diagnosis not present

## 2020-09-20 DIAGNOSIS — E049 Nontoxic goiter, unspecified: Secondary | ICD-10-CM | POA: Diagnosis not present

## 2020-09-20 DIAGNOSIS — I5032 Chronic diastolic (congestive) heart failure: Secondary | ICD-10-CM | POA: Diagnosis not present

## 2020-09-20 DIAGNOSIS — E1122 Type 2 diabetes mellitus with diabetic chronic kidney disease: Secondary | ICD-10-CM | POA: Diagnosis not present

## 2020-09-20 DIAGNOSIS — E7849 Other hyperlipidemia: Secondary | ICD-10-CM | POA: Diagnosis not present

## 2020-09-20 DIAGNOSIS — J209 Acute bronchitis, unspecified: Secondary | ICD-10-CM | POA: Diagnosis not present

## 2020-09-20 DIAGNOSIS — E782 Mixed hyperlipidemia: Secondary | ICD-10-CM | POA: Diagnosis not present

## 2020-09-20 DIAGNOSIS — E039 Hypothyroidism, unspecified: Secondary | ICD-10-CM | POA: Diagnosis not present

## 2020-09-20 DIAGNOSIS — Z23 Encounter for immunization: Secondary | ICD-10-CM | POA: Diagnosis not present

## 2020-10-03 ENCOUNTER — Other Ambulatory Visit: Payer: Self-pay | Admitting: Cardiology

## 2020-10-24 DIAGNOSIS — H524 Presbyopia: Secondary | ICD-10-CM | POA: Diagnosis not present

## 2020-10-24 DIAGNOSIS — H35033 Hypertensive retinopathy, bilateral: Secondary | ICD-10-CM | POA: Diagnosis not present

## 2020-11-18 DIAGNOSIS — I110 Hypertensive heart disease with heart failure: Secondary | ICD-10-CM | POA: Diagnosis not present

## 2020-11-18 DIAGNOSIS — Z7984 Long term (current) use of oral hypoglycemic drugs: Secondary | ICD-10-CM | POA: Diagnosis not present

## 2020-11-18 DIAGNOSIS — E1165 Type 2 diabetes mellitus with hyperglycemia: Secondary | ICD-10-CM | POA: Diagnosis not present

## 2020-11-18 DIAGNOSIS — I5032 Chronic diastolic (congestive) heart failure: Secondary | ICD-10-CM | POA: Diagnosis not present

## 2020-11-18 DIAGNOSIS — E7849 Other hyperlipidemia: Secondary | ICD-10-CM | POA: Diagnosis not present

## 2020-12-17 DIAGNOSIS — E782 Mixed hyperlipidemia: Secondary | ICD-10-CM | POA: Diagnosis not present

## 2020-12-17 DIAGNOSIS — E1122 Type 2 diabetes mellitus with diabetic chronic kidney disease: Secondary | ICD-10-CM | POA: Diagnosis not present

## 2020-12-17 DIAGNOSIS — E7849 Other hyperlipidemia: Secondary | ICD-10-CM | POA: Diagnosis not present

## 2020-12-17 DIAGNOSIS — E039 Hypothyroidism, unspecified: Secondary | ICD-10-CM | POA: Diagnosis not present

## 2020-12-19 DIAGNOSIS — E7849 Other hyperlipidemia: Secondary | ICD-10-CM | POA: Diagnosis not present

## 2020-12-19 DIAGNOSIS — E1165 Type 2 diabetes mellitus with hyperglycemia: Secondary | ICD-10-CM | POA: Diagnosis not present

## 2020-12-19 DIAGNOSIS — I5032 Chronic diastolic (congestive) heart failure: Secondary | ICD-10-CM | POA: Diagnosis not present

## 2020-12-19 DIAGNOSIS — I110 Hypertensive heart disease with heart failure: Secondary | ICD-10-CM | POA: Diagnosis not present

## 2020-12-19 DIAGNOSIS — Z7984 Long term (current) use of oral hypoglycemic drugs: Secondary | ICD-10-CM | POA: Diagnosis not present

## 2020-12-20 DIAGNOSIS — I739 Peripheral vascular disease, unspecified: Secondary | ICD-10-CM | POA: Diagnosis not present

## 2020-12-20 DIAGNOSIS — I5032 Chronic diastolic (congestive) heart failure: Secondary | ICD-10-CM | POA: Diagnosis not present

## 2020-12-20 DIAGNOSIS — M7989 Other specified soft tissue disorders: Secondary | ICD-10-CM | POA: Diagnosis not present

## 2020-12-20 DIAGNOSIS — E1122 Type 2 diabetes mellitus with diabetic chronic kidney disease: Secondary | ICD-10-CM | POA: Diagnosis not present

## 2020-12-20 DIAGNOSIS — E7849 Other hyperlipidemia: Secondary | ICD-10-CM | POA: Diagnosis not present

## 2020-12-20 DIAGNOSIS — E039 Hypothyroidism, unspecified: Secondary | ICD-10-CM | POA: Diagnosis not present

## 2021-01-03 DIAGNOSIS — Z1211 Encounter for screening for malignant neoplasm of colon: Secondary | ICD-10-CM | POA: Diagnosis not present

## 2021-01-03 DIAGNOSIS — Z1212 Encounter for screening for malignant neoplasm of rectum: Secondary | ICD-10-CM | POA: Diagnosis not present

## 2021-01-09 LAB — COLOGUARD: COLOGUARD: NEGATIVE

## 2021-02-15 DIAGNOSIS — Z23 Encounter for immunization: Secondary | ICD-10-CM | POA: Diagnosis not present

## 2021-03-21 DIAGNOSIS — E782 Mixed hyperlipidemia: Secondary | ICD-10-CM | POA: Diagnosis not present

## 2021-03-21 DIAGNOSIS — E7849 Other hyperlipidemia: Secondary | ICD-10-CM | POA: Diagnosis not present

## 2021-03-21 DIAGNOSIS — I5032 Chronic diastolic (congestive) heart failure: Secondary | ICD-10-CM | POA: Diagnosis not present

## 2021-03-21 DIAGNOSIS — E11319 Type 2 diabetes mellitus with unspecified diabetic retinopathy without macular edema: Secondary | ICD-10-CM | POA: Diagnosis not present

## 2021-03-21 DIAGNOSIS — E039 Hypothyroidism, unspecified: Secondary | ICD-10-CM | POA: Diagnosis not present

## 2021-03-27 DIAGNOSIS — E7849 Other hyperlipidemia: Secondary | ICD-10-CM | POA: Diagnosis not present

## 2021-03-27 DIAGNOSIS — Z23 Encounter for immunization: Secondary | ICD-10-CM | POA: Diagnosis not present

## 2021-03-27 DIAGNOSIS — E1122 Type 2 diabetes mellitus with diabetic chronic kidney disease: Secondary | ICD-10-CM | POA: Diagnosis not present

## 2021-03-27 DIAGNOSIS — I739 Peripheral vascular disease, unspecified: Secondary | ICD-10-CM | POA: Diagnosis not present

## 2021-03-27 DIAGNOSIS — I5032 Chronic diastolic (congestive) heart failure: Secondary | ICD-10-CM | POA: Diagnosis not present

## 2021-03-27 DIAGNOSIS — E039 Hypothyroidism, unspecified: Secondary | ICD-10-CM | POA: Diagnosis not present

## 2021-03-27 DIAGNOSIS — E049 Nontoxic goiter, unspecified: Secondary | ICD-10-CM | POA: Diagnosis not present

## 2021-06-24 DIAGNOSIS — D529 Folate deficiency anemia, unspecified: Secondary | ICD-10-CM | POA: Diagnosis not present

## 2021-06-24 DIAGNOSIS — E1165 Type 2 diabetes mellitus with hyperglycemia: Secondary | ICD-10-CM | POA: Diagnosis not present

## 2021-06-24 DIAGNOSIS — D519 Vitamin B12 deficiency anemia, unspecified: Secondary | ICD-10-CM | POA: Diagnosis not present

## 2021-06-24 DIAGNOSIS — D649 Anemia, unspecified: Secondary | ICD-10-CM | POA: Diagnosis not present

## 2021-07-03 DIAGNOSIS — E049 Nontoxic goiter, unspecified: Secondary | ICD-10-CM | POA: Diagnosis not present

## 2021-07-03 DIAGNOSIS — R42 Dizziness and giddiness: Secondary | ICD-10-CM | POA: Diagnosis not present

## 2021-07-03 DIAGNOSIS — I739 Peripheral vascular disease, unspecified: Secondary | ICD-10-CM | POA: Diagnosis not present

## 2021-07-03 DIAGNOSIS — I5032 Chronic diastolic (congestive) heart failure: Secondary | ICD-10-CM | POA: Diagnosis not present

## 2021-07-03 DIAGNOSIS — Z0001 Encounter for general adult medical examination with abnormal findings: Secondary | ICD-10-CM | POA: Diagnosis not present

## 2021-07-03 DIAGNOSIS — E1122 Type 2 diabetes mellitus with diabetic chronic kidney disease: Secondary | ICD-10-CM | POA: Diagnosis not present

## 2021-07-11 ENCOUNTER — Encounter (INDEPENDENT_AMBULATORY_CARE_PROVIDER_SITE_OTHER): Payer: Medicare Other | Admitting: Ophthalmology

## 2021-07-15 DIAGNOSIS — R42 Dizziness and giddiness: Secondary | ICD-10-CM | POA: Diagnosis not present

## 2021-07-15 DIAGNOSIS — G2581 Restless legs syndrome: Secondary | ICD-10-CM | POA: Diagnosis not present

## 2021-07-19 DIAGNOSIS — R2681 Unsteadiness on feet: Secondary | ICD-10-CM | POA: Diagnosis not present

## 2021-07-19 DIAGNOSIS — R42 Dizziness and giddiness: Secondary | ICD-10-CM | POA: Diagnosis not present

## 2021-07-19 DIAGNOSIS — M2569 Stiffness of other specified joint, not elsewhere classified: Secondary | ICD-10-CM | POA: Diagnosis not present

## 2021-07-22 ENCOUNTER — Encounter (INDEPENDENT_AMBULATORY_CARE_PROVIDER_SITE_OTHER): Payer: Medicare Other | Admitting: Ophthalmology

## 2021-07-22 DIAGNOSIS — I10 Essential (primary) hypertension: Secondary | ICD-10-CM

## 2021-07-22 DIAGNOSIS — H33301 Unspecified retinal break, right eye: Secondary | ICD-10-CM | POA: Diagnosis not present

## 2021-07-22 DIAGNOSIS — H33102 Unspecified retinoschisis, left eye: Secondary | ICD-10-CM | POA: Diagnosis not present

## 2021-07-22 DIAGNOSIS — H2513 Age-related nuclear cataract, bilateral: Secondary | ICD-10-CM | POA: Diagnosis not present

## 2021-07-22 DIAGNOSIS — H35033 Hypertensive retinopathy, bilateral: Secondary | ICD-10-CM

## 2021-07-22 DIAGNOSIS — H43813 Vitreous degeneration, bilateral: Secondary | ICD-10-CM | POA: Diagnosis not present

## 2021-07-24 DIAGNOSIS — R42 Dizziness and giddiness: Secondary | ICD-10-CM | POA: Diagnosis not present

## 2021-07-24 DIAGNOSIS — M2569 Stiffness of other specified joint, not elsewhere classified: Secondary | ICD-10-CM | POA: Diagnosis not present

## 2021-07-24 DIAGNOSIS — R2681 Unsteadiness on feet: Secondary | ICD-10-CM | POA: Diagnosis not present

## 2021-07-25 DIAGNOSIS — E7849 Other hyperlipidemia: Secondary | ICD-10-CM | POA: Diagnosis not present

## 2021-07-25 DIAGNOSIS — R42 Dizziness and giddiness: Secondary | ICD-10-CM | POA: Diagnosis not present

## 2021-07-25 DIAGNOSIS — E1122 Type 2 diabetes mellitus with diabetic chronic kidney disease: Secondary | ICD-10-CM | POA: Diagnosis not present

## 2021-07-25 DIAGNOSIS — E039 Hypothyroidism, unspecified: Secondary | ICD-10-CM | POA: Diagnosis not present

## 2021-07-25 DIAGNOSIS — G2581 Restless legs syndrome: Secondary | ICD-10-CM | POA: Diagnosis not present

## 2021-07-30 ENCOUNTER — Encounter: Payer: Self-pay | Admitting: *Deleted

## 2021-07-30 ENCOUNTER — Encounter: Payer: Self-pay | Admitting: Cardiology

## 2021-07-30 ENCOUNTER — Ambulatory Visit: Payer: Medicare Other | Admitting: Cardiology

## 2021-07-30 VITALS — BP 128/64 | HR 81 | Ht 69.0 in | Wt 201.6 lb

## 2021-07-30 DIAGNOSIS — R600 Localized edema: Secondary | ICD-10-CM

## 2021-07-30 DIAGNOSIS — I10 Essential (primary) hypertension: Secondary | ICD-10-CM

## 2021-07-30 DIAGNOSIS — R0602 Shortness of breath: Secondary | ICD-10-CM | POA: Diagnosis not present

## 2021-07-30 DIAGNOSIS — E782 Mixed hyperlipidemia: Secondary | ICD-10-CM

## 2021-07-30 MED ORDER — FUROSEMIDE 20 MG PO TABS: 40.0000 mg | ORAL_TABLET | ORAL | 2 refills | Status: DC | PRN

## 2021-07-30 NOTE — Patient Instructions (Addendum)
Medication Instructions:  ?Increase Lasix to '40mg'$  as needed for swelling  ?Continue all other medications.    ? ?Labwork: ?none ? ?Testing/Procedures: ?Your physician has recommended that you have a pulmonary function test. Pulmonary Function Tests are a group of tests that measure how well air moves in and out of your lungs.  ?A chest x-ray takes a picture of the organs and structures inside the chest, including the heart, lungs, and blood vessels. This test can show several things, including, whether the heart is enlarges; whether fluid is building up in the lungs; and whether pacemaker / defibrillator leads are still in place.  ?Office will contact with results via phone or letter.    ? ?Follow-Up: ? Pending test results  ? ?Any Other Special Instructions Will Be Listed Below (If Applicable). ? ? ?If you need a refill on your cardiac medications before your next appointment, please call your pharmacy. ? ?

## 2021-07-30 NOTE — Addendum Note (Signed)
Addended by: Sung Amabile on: 07/30/2021 03:04 PM ? ? Modules accepted: Orders ? ?

## 2021-07-30 NOTE — Progress Notes (Signed)
? ? ? ?Clinical Summary ?Mr. Duane Stevens is a 76 y.o.male seen today for follow up of the following medical problems.  ?  ?  ?1. SOB ?- pcp ordered a stress test at [facility] ?- 08/2019 Nuclear stress: apical infarct, LVEF 48%, no EKG changes, rated as intermediate risk. From exercise portion only went 2 min 48 sec. He reports was told to stop exercising  ?  ?- no chest pain ?- DOE at 1 block or less for a few months ?- some recent LE edema x 1 year, with some increase ?- former smoker x 30 years. Previously worked in [employer] ?  ?09/2019 echo: LVEF 60-65%, indet DDx ? ?- DOE with walking room to room, reports chronic and unchanged.  ? ?2. HTN ?- compliant with meds ? ?3. Hyperlipidemia ?- recent labs with pcp ? ?4. LE edema ?- taking lasix '20mg'$  daily, not controlling swelling.  ?  ?Past Medical History:  ?Diagnosis Date  ? Allergy   ? Arthritis   ? Diabetes mellitus   ? Hypercholesterolemia   ? Hypertension   ? Hypothyroidism   ? Prostate cancer ([place])   ? hx of  ? Thyroid disease   ? ? ? ?Allergies  ?Allergen Reactions  ? Bee Venom Hives and Swelling  ?  Yellow Jacket  ? Oxycodone Hcl Itching  ? Penicillins   ?  Did it involve swelling of the face/tongue/throat, SOB, or low BP? Unknown ?Did it involve sudden or severe rash/hives, skin peeling, or any reaction on the inside of your mouth or nose? Unknown ?Did you need to seek medical attention at a hospital or doctor's office? Unknown ?When did it last happen?   Over 10 years    ?If all above answers are ?NO?, may proceed with cephalosporin use. ?  ? ? ? ?Current Outpatient Medications  ?Medication Sig Dispense Refill  ? aspirin 81 MG tablet Take 81 mg by mouth every evening.    ? Cholecalciferol (VITAMIN D3) 1000 UNITS CAPS Take 1 capsule by mouth every evening.    ? fluticasone (FLONASE) 50 MCG/ACT nasal spray Place 2 sprays into both nostrils daily as needed for allergies.     ? furosemide (LASIX) 20 MG tablet Take 1 tablet (20 mg total) by mouth daily as needed. 15  tablet 0  ? glipiZIDE (GLUCOTROL XL) 2.5 MG 24 hr tablet Take 2.5 mg by mouth daily.    ? levothyroxine (SYNTHROID) 100 MCG tablet Take 1 tablet (100 mcg total) by mouth daily. 90 tablet 3  ? losartan (COZAAR) 25 MG tablet Take 25 mg by mouth daily.     ? meclizine (ANTIVERT) 25 MG tablet Take 1 tablet (25 mg total) by mouth 3 (three) times daily as needed for dizziness. May cause drowsiness 30 tablet 0  ? metFORMIN (GLUCOPHAGE) 500 MG tablet Take 1,000 mg by mouth 2 (two) times daily.    ? simvastatin (ZOCOR) 20 MG tablet Take 20 mg by mouth every evening.    ? Turmeric 500 MG TABS Take 500 mg by mouth every evening.     ? ?No current facility-administered medications for this visit.  ? ? ? ?Past Surgical History:  ?Procedure Laterality Date  ? ANKLE SURGERY    ? BACK SURGERY    ? HERNIA REPAIR    ? PROSTATECTOMY    ? ? ? ?Allergies  ?Allergen Reactions  ? Bee Venom Hives and Swelling  ?  Yellow Jacket  ? Oxycodone Hcl Itching  ? Penicillins   ?  Did it involve swelling of the face/tongue/throat, SOB, or low BP? Unknown ?Did it involve sudden or severe rash/hives, skin peeling, or any reaction on the inside of your mouth or nose? Unknown ?Did you need to seek medical attention at a hospital or doctor's office? Unknown ?When did it last happen?   Over 10 years    ?If all above answers are ?NO?, may proceed with cephalosporin use. ?  ? ? ? ? ?Family History  ?Problem Relation Age of Onset  ? Cancer Other   ?     FH of Lung Cancer  ? Cancer Other   ?     FH of Prostate Cancer  ? Diabetes Other   ?     Parent  ? Heart disease Other   ?     Parent  ? ? ? ?Social History ?Mr. Duane Stevens reports that he has quit smoking. He has never used smokeless tobacco. ?Mr. Duane Stevens reports no history of alcohol use. ? ? ?Review of Systems ?CONSTITUTIONAL: No weight loss, fever, chills, weakness or fatigue.  ?HEENT: Eyes: No visual loss, blurred vision, double vision or yellow sclerae.No hearing loss, sneezing, congestion, runny nose or  sore throat.  ?SKIN: No rash or itching.  ?CARDIOVASCULAR: per hpi ?RESPIRATORY: No shortness of breath, cough or sputum.  ?GASTROINTESTINAL: No anorexia, nausea, vomiting or diarrhea. No abdominal pain or blood.  ?GENITOURINARY: No burning on urination, no polyuria ?NEUROLOGICAL: No headache, dizziness, syncope, paralysis, ataxia, numbness or tingling in the extremities. No change in bowel or bladder control.  ?MUSCULOSKELETAL: No muscle, back pain, joint pain or stiffness.  ?LYMPHATICS: No enlarged nodes. No history of splenectomy.  ?PSYCHIATRIC: No history of depression or anxiety.  ?ENDOCRINOLOGIC: No reports of sweating, cold or heat intolerance. No polyuria or polydipsia.  ?. ? ? ?Physical Examination ?Today's Vitals  ? 07/30/21 1316  ?BP: 128/64  ?Pulse: 81  ?SpO2: 96%  ?Weight: 201 lb 9.6 oz (91.4 kg)  ?Height: '5\' 9"'$  (1.753 m)  ? ?Body mass index is 29.77 kg/m?. ? ?Gen: resting comfortably, no acute distress ?HEENT: no scleral icterus, pupils equal round and reactive, no palptable cervical adenopathy,  ?CV: RRR, no m/r/g no jvd ?Resp: Clear to auscultation bilaterally ?GI: abdomen is soft, non-tender, non-distended, normal bowel sounds, no hepatosplenomegaly ?MSK: extremities are warm, 1+ bilateral LE edema ?Skin: warm, no rash ?Neuro:  no focal deficits ?Psych: appropriate affect ? ? ?Diagnostic Studies ?09/2019 echo ?IMPRESSIONS  ? ? ? 1. Left ventricular ejection fraction, by estimation, is 60 to 65%. The  ?left ventricle has normal function. The left ventricle has no regional  ?wall motion abnormalities. Left ventricular diastolic parameters are  ?indeterminate.  ? 2. Right ventricular systolic function is normal. The right ventricular  ?size is normal. There is normal pulmonary artery systolic pressure.  ? 3. The mitral valve is normal in structure. No evidence of mitral valve  ?regurgitation. No evidence of mitral stenosis.  ? 4. The aortic valve is tricuspid. Aortic valve regurgitation is not   ?visualized. No aortic stenosis is present.  ? 5. The inferior vena cava is normal in size with greater than 50%  ?respiratory variability, suggesting right atrial pressure of 3 mmHg.  ? ? ? ?Assessment and Plan  ? ?1. SOB ?- fairly benign cardiac workup in 2021 ?- ongoing symptoms. Prior smoker, environmentl exposures working in [employer] ?- will order PA and lateral CXR, PFTs ? ?2. HTN ?-at goal, continue current meds ? ?3. Hyperlipidemia ?- he is on  simvastatin, request pcp labs ? ?4. LE edema ?- not controlled, increase lasix to '40mg'$  prn ?- fairly benign echo 09/2019, likely noncardiogenic edema.  ? ?EKG today shows NSR, no ischemic changes.  ? ? ?Likely f/u just as needed pending CXR and PFT findings ? ?Arnoldo Lenis, M.D ?

## 2021-07-31 DIAGNOSIS — R42 Dizziness and giddiness: Secondary | ICD-10-CM | POA: Diagnosis not present

## 2021-07-31 DIAGNOSIS — R2681 Unsteadiness on feet: Secondary | ICD-10-CM | POA: Diagnosis not present

## 2021-07-31 DIAGNOSIS — M2569 Stiffness of other specified joint, not elsewhere classified: Secondary | ICD-10-CM | POA: Diagnosis not present

## 2021-07-31 NOTE — Progress Notes (Signed)
 [facility] 07/31/2021 Note Type: Treatment Note    Patient Name: Duane Stevens Date of Birth:July 05, 1945 Diagnosis:  Encounter Diagnoses  Name Primary?  . Dizzinesses Yes  . Difficulty in walking   . Unsteady gait    Referring MD:  Lari Elspeth Holmes*   Date of Onset of Impairment-No date available Date PT Care Plan Established or Reviewed-07/22/2021 Date PT Treatment Started-No date available  Plan of Care Effective Date: 07/19/2021 - 09/18/2021 Visit 1/12 Reassessment due 04/31/23 Assessment/Plan:   Assessment details:      Patient reported feeling fatigued after PT treatment session today and reported feeling like he wanted to go home and lay down, however no dizziness after PT treatment session. Will continue to progress as patient able to tolerate. Patient encouraged to continue with home exercise program as he is able to tolerate and patient verbalized understanding and agreed.   Impairments indicating medical necessity and functional limitations include: significant dizziness impairments that are limiting patient's ability to complete IADL's or home/work tasks without difficulty. Treatment classification: Vestibular Dysfunction. Patient will benefit from skilled PT intervention to address current body structure impairments and activity limitations to return patient to prior level of function.       Impairments: dizziness/vertigo and fall risk        Clinical Presentation: stable   Clinical Decision Making: moderate   Prognosis: good prognosis   Positive Prognosis Rationale: motivated for treatment.  Therapy Goals     Goals:     Short-term Goals to be achieved in 3 weeks. Pt. will: 1. Improve cervical spine AROM symptom free by 50% for improved ADL's. 2. Independently self-reduce symptoms by 50% for improved ADL's. 3. Complete ADL's with no greater than 3/10 dizziness. 4. Improve Dizziness Handicap Inventory by 10  pts. 5. Improve ability to perform ADL's/home tasks > 5 hours with no greater than 2/10 symptoms.   Long-term Goals to be achieved in 6 weeks. Patient will: 1. Be Independent with home exercise program for continued self treatment. 2. Have full symptom free quick/sudden cervical spine AROM for driving vehicle/normal ADL's 3. Able to drive vehicle symptom free. 4. Complete all ADL's without onset of symptoms. 5. Improve Dizziness Handicap Inventory by 20pts.   Plan   Therapy options: will be seen for skilled physical therapy services   Planned therapy interventions: Vestibular Therapy, Balance Training, Manual Therapy and Neuromuscular Re-education    Frequency: 2x week   Duration in weeks: 6   Education provided to: patient.   Education provided: HEP and Fall prevention   Education results: needs reinforcement and verbalized good understanding.   Communication/Consultation: Discussed with PTA.    Total Session Time: 53   Treatment rendered today:     Neuromuscular Re-education:  -Standing VOR x 1 viewing horizontal and vertical x 1 min each with moving BITS letter chart at 80 BPM -Standing with eyes closed with horizontal and vertical head movements x 1 min each at 80 BPM -Standing on blue foam Airex with VOR x 1 viewing horizontal and vertical x 1 min each with moving BITS letter chart at 80 BPM -Dynamic gait with VOR x 1 viewing horizontal and vertical viewing 3 x 50 feet each way. -Dynamic stepping over two 6 obstacles in parallel bars forward and lateral x6 laps each way with patient requiring one to two upper extremity support.  -Bending over with overhead reaching cone taps to top of raised BITS x10 repetitions -Seated VOR x 1 viewing horizontal  and vertical x 2 minutes trial 1 and x1 minute trial 2 each way with moving BITS letter chart at 80 BPM  Pt. was progressed to tolerance with balance & vestibular training on stable and unstable surfaces with eyes open (VOR x1 viewing)  & eyes closed in static and dynamic positions, and pt. required CGA to min. A. due to multiple (>20) episodes of loss of balance. Pt. also required mod. VCs for proper exercise technique.    02883: Pt. was given gait training on uneven grassy surfaces with pt. requiring SBA due to one minor loss of balance, decreased gait speed, decreased stride length, and wide base of support x 220 feet.    Plan details: Intervention to progress with pain reduction, stabilization, core strengthening, postural re-education, recovery of function and prophylaxis.  Modalities (heat, ice, ultrasound, electrical stimulation, iontophoresis), as needed for pain management. Massage, mobilization and manual therapy as needed to increased ROM in restricted soft tissues and joints. Therapeutic exercise, Neuromuscular Re-education, and education.   Subjective:  History of Present Condition   History of Present Condition/Chief Complaint:      Patient is a 76 y/o male who presents to PT with reports of sudden and limiting dizziness that has been waxing and waining x 6 months. Patient reports symptoms are intermittant and happen most in am and after prolonged sitting in his recliner or with sudden head movements (supine to sitting up). Patient denies facial numbness, tingling, headaches or nausea related to dizziness.  Subjective:    Patient reported that he stayed in bed almost all day for the past several days and was non-compliant with his home exercise program.  Quality of life: good  Pain No pain reported Quality: intermittent Relieving factors: rest Aggravating factors: bending and rising from sitting   Precautions and Equipment Precautions: Fall risk Current Braces/Orthoses: None Equipment Currently Used: None Prior Functional Status:    Functional Limitation(s)-patient reports limitations of 40% of daily functions due to dizziness. Current functional status: limited bending, limited household activities and  unsteady gait Social Support Hand dominance: right Communication Preference: verbal, written and visual  Diagnostic Tests X-ray: normal     Patient Goals Patient goals for therapy: improved balance and decrease dizziness   Objective:    Tests    Functional Assessment  Functional Assessment Comments-PERCEIVED FUNCTIONAL LIMITATION. Scale:  Dizziness Handicap Inventory: 45% disabled, INDICATING: Limitations with driving vehicle, community and home tasks. Description: Patient reports activities that require looking up and down repeatedly increases symptoms. OBJECTIVE: OBSERVATION/INSPECTION: Patient presents with slow, careful movements; cautious gait with limited head movement. NEUROLOGICAL: Dermatomes: WNL Myotomes: WNL Reflexes: WNL Upper motor neuron screen: Negative   Cervical Spine AROM/MOVEMENT LOSS: Flex= FULL Ext= LIMITED BY 50% Rot R= LIMITED BY 25% Rot L= LIMITED BY 25% Side Flex R= FULL Side Flex L= FULL Protraction= FULL Retraction= FULL   JOINT MOTION (AROM in degrees): Bilateral UE AROM within normal limits.   PALPATION: Patient with no tenderness to palpation B occiput, B trigeminal nn.   SPECIAL TESTS: Dix-Hallpike: sidelying and supine tests:  Left: symptom provocative and resolved within 30 seconds each test, no nystagmus noted. Right: less symptom provocation each test with no noted nystagmus Vestibular central tests:           Smooth Pursuit: negative           Gaze Evoked Nystagmus: negative           Saccadic Eye Movement: Negative  Ocular Alignment: negative EDUCATION/COGNITION: Patient is alert and oriented X 4 without limitation for learning. Patient educated regarding postural correction and issued a written and pictorial home exercise program.                    I attest that I have reviewed the above information. Signed: Norleen SHAUNNA Borders, PT 07/31/2021 10:04 AM

## 2021-08-02 DIAGNOSIS — R2681 Unsteadiness on feet: Secondary | ICD-10-CM | POA: Diagnosis not present

## 2021-08-02 DIAGNOSIS — M2569 Stiffness of other specified joint, not elsewhere classified: Secondary | ICD-10-CM | POA: Diagnosis not present

## 2021-08-02 DIAGNOSIS — R42 Dizziness and giddiness: Secondary | ICD-10-CM | POA: Diagnosis not present

## 2021-08-02 NOTE — Progress Notes (Signed)
 [facility] 08/02/2021 Note Type: Treatment Note    Patient Name: Duane Stevens Date of Birth:[birth year] Diagnosis:  Encounter Diagnoses  Name Primary?  . Dizzinesses Yes  . Difficulty in walking   . Unsteady gait    Referring MD:  Lari Elspeth Holmes*   Date of Onset of Impairment-No date available Date PT Care Plan Established or Reviewed-07/22/2021 Date PT Treatment Started-No date available  Plan of Care Effective Date: 07/19/2021 - 09/18/2021 Visit 4/12 Reassessment due 04/31/23 Assessment/Plan:   Assessment details:      Patient again reported feeling fatigued after PT treatment session today, however no dizziness after PT treatment session. Will continue to progress as patient able to tolerate. Patient encouraged to continue with home exercise program as he is able to tolerate and patient verbalized understanding and agreed.   Impairments indicating medical necessity and functional limitations include: significant dizziness impairments that are limiting patient's ability to complete IADL's or home/work tasks without difficulty. Treatment classification: Vestibular Dysfunction. Patient will benefit from skilled PT intervention to address current body structure impairments and activity limitations to return patient to prior level of function.       Impairments: dizziness/vertigo and fall risk        Clinical Presentation: stable   Clinical Decision Making: moderate   Prognosis: good prognosis   Positive Prognosis Rationale: motivated for treatment.  Therapy Goals     Goals:     Short-term Goals to be achieved in 3 weeks. Pt. will: 1. Improve cervical spine AROM symptom free by 50% for improved ADL's. 2. Independently self-reduce symptoms by 50% for improved ADL's. 3. Complete ADL's with no greater than 3/10 dizziness. 4. Improve Dizziness Handicap Inventory by 10 pts. 5. Improve ability to perform ADL's/home tasks > 5  hours with no greater than 2/10 symptoms.   Long-term Goals to be achieved in 6 weeks. Patient will: 1. Be Independent with home exercise program for continued self treatment. 2. Have full symptom free quick/sudden cervical spine AROM for driving vehicle/normal ADL's 3. Able to drive vehicle symptom free. 4. Complete all ADL's without onset of symptoms. 5. Improve Dizziness Handicap Inventory by 20pts.   Plan   Therapy options: will be seen for skilled physical therapy services   Planned therapy interventions: Vestibular Therapy, Balance Training, Manual Therapy and Neuromuscular Re-education    Frequency: 2x week   Duration in weeks: 6   Education provided to: patient.   Education provided: HEP and Fall prevention   Education results: needs reinforcement and verbalized good understanding.   Communication/Consultation: Discussed with PTA.    Total Session Time: 56   Treatment rendered today:     Neuromuscular Re-education: -Dynamic gait with VOR x 1 viewing horizontal and vertical viewing 5 x 50 feet each way. -Seated VOR x 1 viewing horizontal and vertical x 1 minutes each way with moving BITS letter chart at 80 BPM -Standing VOR x 1 viewing horizontal and vertical x 2 min each with moving BITS letter chart at 80 BPM -Standing with eyes closed with horizontal and vertical head movements x 2 min each at 80 BPM -Standing on blue foam Airex with VOR x 1 viewing horizontal and vertical x 2 min each with moving BITS letter chart at 80 BPM -Standing on blue foam Airex with eyes closed with horizontal and vertical head movements x2 min. with moving BITS letter chart at 80 BPM -Standing dynamic alternating cone taps with feet (small cones) x20 repetitions bilaterally -Dynamic  stepping over 6 obstacles in parallel bars forward and lateral x20 reps each way with patient requiring only 1 upper extremity support today -Bending over & overhead reaching with cone x10 repetitions   Pt. was again  progressed to tolerance with balance & vestibular training on stable and unstable surfaces with eyes open (VOR x1 viewing) & eyes closed in static and dynamic positions, and pt. required CGA to min. A. due to multiple (>10) episodes of loss of balance. Pt. also required min. VCs for proper exercise technique.      Plan details: Intervention to progress with pain reduction, stabilization, core strengthening, postural re-education, recovery of function and prophylaxis.  Modalities (heat, ice, ultrasound, electrical stimulation, iontophoresis), as needed for pain management. Massage, mobilization and manual therapy as needed to increased ROM in restricted soft tissues and joints. Therapeutic exercise, Neuromuscular Re-education, and education.   Subjective:  History of Present Condition   History of Present Condition/Chief Complaint:      Patient is a 76 y/o male who presents to PT with reports of sudden and limiting dizziness that has been waxing and waining x 6 months. Patient reports symptoms are intermittant and happen most in am and after prolonged sitting in his recliner or with sudden head movements (supine to sitting up). Patient denies facial numbness, tingling, headaches or nausea related to dizziness.  Subjective:    Patient reported that he is taking motion sickness pills and he doesn't know what their name is. Patient reported that he mowed his yard the other day without increased symptoms or difficulty.  Quality of life: good  Pain No pain reported Quality: intermittent Relieving factors: rest Aggravating factors: bending and rising from sitting   Precautions and Equipment Precautions: Fall risk Current Braces/Orthoses: None Equipment Currently Used: None Prior Functional Status:    Functional Limitation(s)-patient reports limitations of 40% of daily functions due to dizziness. Current functional status: limited bending, limited household activities and unsteady gait Social  Support Hand dominance: right Communication Preference: verbal, written and visual  Diagnostic Tests X-ray: normal     Patient Goals Patient goals for therapy: improved balance and decrease dizziness   Objective:    Tests    Functional Assessment  Functional Assessment Comments-PERCEIVED FUNCTIONAL LIMITATION. Scale:  Dizziness Handicap Inventory: 45% disabled, INDICATING: Limitations with driving vehicle, community and home tasks. Description: Patient reports activities that require looking up and down repeatedly increases symptoms. OBJECTIVE: OBSERVATION/INSPECTION: Patient presents with slow, careful movements; cautious gait with limited head movement. NEUROLOGICAL: Dermatomes: WNL Myotomes: WNL Reflexes: WNL Upper motor neuron screen: Negative   Cervical Spine AROM/MOVEMENT LOSS: Flex= FULL Ext= LIMITED BY 50% Rot R= LIMITED BY 25% Rot L= LIMITED BY 25% Side Flex R= FULL Side Flex L= FULL Protraction= FULL Retraction= FULL   JOINT MOTION (AROM in degrees): Bilateral UE AROM within normal limits.   PALPATION: Patient with no tenderness to palpation B occiput, B trigeminal nn.   SPECIAL TESTS: Dix-Hallpike: sidelying and supine tests:  Left: symptom provocative and resolved within 30 seconds each test, no nystagmus noted. Right: less symptom provocation each test with no noted nystagmus Vestibular central tests:           Smooth Pursuit: negative           Gaze Evoked Nystagmus: negative           Saccadic Eye Movement: Negative           Ocular Alignment: negative EDUCATION/COGNITION: Patient is alert and oriented X 4 without limitation  for learning. Patient educated regarding postural correction and issued a written and pictorial home exercise program.                    I attest that I have reviewed the above information. Signed: Norleen SHAUNNA Borders, PT 08/02/2021 8:29 AM

## 2021-08-06 DIAGNOSIS — M2569 Stiffness of other specified joint, not elsewhere classified: Secondary | ICD-10-CM | POA: Diagnosis not present

## 2021-08-06 DIAGNOSIS — R42 Dizziness and giddiness: Secondary | ICD-10-CM | POA: Diagnosis not present

## 2021-08-06 DIAGNOSIS — R2681 Unsteadiness on feet: Secondary | ICD-10-CM | POA: Diagnosis not present

## 2021-08-08 DIAGNOSIS — M2569 Stiffness of other specified joint, not elsewhere classified: Secondary | ICD-10-CM | POA: Diagnosis not present

## 2021-08-08 DIAGNOSIS — R42 Dizziness and giddiness: Secondary | ICD-10-CM | POA: Diagnosis not present

## 2021-08-08 DIAGNOSIS — R2681 Unsteadiness on feet: Secondary | ICD-10-CM | POA: Diagnosis not present

## 2021-08-10 DIAGNOSIS — K8020 Calculus of gallbladder without cholecystitis without obstruction: Secondary | ICD-10-CM | POA: Diagnosis not present

## 2021-08-10 DIAGNOSIS — R918 Other nonspecific abnormal finding of lung field: Secondary | ICD-10-CM | POA: Diagnosis not present

## 2021-08-10 DIAGNOSIS — J432 Centrilobular emphysema: Secondary | ICD-10-CM | POA: Diagnosis not present

## 2021-08-10 DIAGNOSIS — S299XXA Unspecified injury of thorax, initial encounter: Secondary | ICD-10-CM | POA: Diagnosis not present

## 2021-08-10 DIAGNOSIS — R911 Solitary pulmonary nodule: Secondary | ICD-10-CM | POA: Diagnosis not present

## 2021-08-10 DIAGNOSIS — I10 Essential (primary) hypertension: Secondary | ICD-10-CM | POA: Diagnosis not present

## 2021-08-10 DIAGNOSIS — E119 Type 2 diabetes mellitus without complications: Secondary | ICD-10-CM | POA: Diagnosis not present

## 2021-08-10 DIAGNOSIS — S0003XA Contusion of scalp, initial encounter: Secondary | ICD-10-CM | POA: Diagnosis not present

## 2021-08-10 DIAGNOSIS — I2510 Atherosclerotic heart disease of native coronary artery without angina pectoris: Secondary | ICD-10-CM | POA: Diagnosis not present

## 2021-08-10 DIAGNOSIS — W108XXA Fall (on) (from) other stairs and steps, initial encounter: Secondary | ICD-10-CM | POA: Diagnosis not present

## 2021-08-10 DIAGNOSIS — E7800 Pure hypercholesterolemia, unspecified: Secondary | ICD-10-CM | POA: Diagnosis not present

## 2021-08-10 DIAGNOSIS — M47812 Spondylosis without myelopathy or radiculopathy, cervical region: Secondary | ICD-10-CM | POA: Diagnosis not present

## 2021-08-10 DIAGNOSIS — Z8673 Personal history of transient ischemic attack (TIA), and cerebral infarction without residual deficits: Secondary | ICD-10-CM | POA: Diagnosis not present

## 2021-08-10 DIAGNOSIS — S3991XA Unspecified injury of abdomen, initial encounter: Secondary | ICD-10-CM | POA: Diagnosis not present

## 2021-08-10 DIAGNOSIS — S59901A Unspecified injury of right elbow, initial encounter: Secondary | ICD-10-CM | POA: Diagnosis not present

## 2021-08-10 DIAGNOSIS — E079 Disorder of thyroid, unspecified: Secondary | ICD-10-CM | POA: Diagnosis not present

## 2021-08-10 DIAGNOSIS — I700 Atherosclerosis of aorta: Secondary | ICD-10-CM | POA: Diagnosis not present

## 2021-08-10 DIAGNOSIS — I639 Cerebral infarction, unspecified: Secondary | ICD-10-CM | POA: Diagnosis not present

## 2021-08-10 DIAGNOSIS — J439 Emphysema, unspecified: Secondary | ICD-10-CM | POA: Diagnosis not present

## 2021-08-10 DIAGNOSIS — R3129 Other microscopic hematuria: Secondary | ICD-10-CM | POA: Diagnosis not present

## 2021-08-10 NOTE — ED Provider Notes (Signed)
 Emergency Department Provider Note    ED Clinical Impression   Final diagnoses:  Fall, initial encounter (Primary)  Microscopic hematuria  Pulmonary nodules  Calculus of gallbladder without cholecystitis without obstruction  Pulmonary emphysema, unspecified emphysema type (CMS-HCC)  Cerebral infarction, chronic  Hematoma of scalp, initial encounter    ED Assessment/Plan   ED Course as of 08/10/21 1310  Sat Aug 10, 2021  1043 EKG reviewed.  Per my read, sinus rhythm, PR and QTc grossly appropriate, rate appropriate, no acute ischemic changes  1045 XR Elbow 3 Or More Views Right Elbow x-ray reviewed by me.  Agree with radiology interpretation, no acute pathology  1229 On reevaluation, patient resting in bed in no acute distress.  I was able to clear his c-collar at bedside.  With patient and family at bedside I discussed the work-up and results with them.  Discussed that imaging does not show obvious severe traumatic injury.  However, there were multiple incidental findings today that I discussed with patient and family that need to be followed up further as an outpatient including cholelithiasis, scattered pulmonary nodules, emphysema, and chronic infarcts.  Also discussed there is trace hematuria.  Patient tells me he has a history of prior prostate cancer, so I discussed that he needs to have this monitored as an outpatient as he may need to see urology for a cystoscopy if the hematuria persists.  Discussed with him and family that he has evidence of a scalp hematoma but no obvious intracranial bleeding or severe traumatic injury today.  We are waiting for his repeat troponin to rule out blunt myocardial injury, which I think is probably less likely given his presentation.  If repeat troponin is reassuring I think he is able to be discharged home with supportive care and close PCP outpatient follow-up.  Patient and family are in agreement with this plan  1242 Blood, UA(!): Trace Patient and  family informed of the microscopic hematuria and the need to follow-up  1303 hsTroponin I: 7  1303 delta hsTroponin I: 0  1304 Repeat troponin is reassuring with a delta of 0.  I discussed the incidental findings with patient and family today.  Counseled patient and family on supportive care.  He has been given a lidocaine patch for comfort in the ER.  At this time, patient and family are comfortable with discharge home.  However, they were given strict return precautions to return to the ER immediately for new or worsening symptoms, worsening pain, chest pain, problems breathing, fevers, worsening head pain, vomiting, confusion, or any other concerning symptoms.  They expressed understanding and agreement with plan and were given the opportunity to ask questions      History   Chief Complaint  Patient presents with  . Fall  . Closed Head Injury Without LOC   HPI  This is a 76 year old male who presents to the ER with his friend after he fell off a ladder.  Friend is with the patient and helps provide the history.  They report that he fell about 1-2 steps from the ground off the back of a ladder and struck the back of his head on cement.  Patient and friend report that he was working and using the ladder when it broke and he fell backwards.  He did hit his head but denies loss of consciousness.  No vomiting.  Patient does have a hematoma to the back of his scalp.  Friend reports that the accident occurred around 950 this morning.  Patient states his tetanus immunization is up-to-date.  He is reporting some pain in his right upper posterior ribs with some shortness of breath, his posterior scalp, and his right elbow.  He denies any pain throughout the rest of his back or his other extremities, chest pain, abdominal pain, nausea, vomiting, dizziness, blurry vision.  He states he was able to ambulate into the ER without difficulty.  He is anticoagulated with aspirin only.  He has a history of diabetes,  hypertension, and hyperlipidemia.  Past Medical History:  Diagnosis Date  . Diabetes mellitus (CMS-HCC)   . Disease of thyroid  gland   . High cholesterol   . Hypertension     No past surgical history on file.  History reviewed. No pertinent family history.  Social History   Socioeconomic History  . Marital status: Married    Spouse name: None  . Number of children: None  . Years of education: None  . Highest education level: None  Tobacco Use  . Smoking status: Former  . Smokeless tobacco: Never  Substance and Sexual Activity  . Alcohol use: Never  . Drug use: Never    Review of Systems  Respiratory: Positive for shortness of breath.   Musculoskeletal:       +elbow pain  Skin: Positive for wound.  All other systems reviewed and are negative.   Physical Exam   Vitals:   08/10/21 1159 08/10/21 1204 08/10/21 1209 08/10/21 1232  BP: 106/67   128/74  Pulse: 77 82 83 99  Resp: 15 17 17 20   Temp:      TempSrc:      SpO2: 98% 98% 96% 100%  Weight:       Physical Exam Vitals and nursing note reviewed.  Constitutional:      General: He is not in acute distress.    Appearance: He is not ill-appearing, toxic-appearing or diaphoretic.     Comments: Awake, alert, resting in bed in no acute distress  HENT:     Head:     Comments: + Hematoma to the posterior scalp with some superficial abrasions, otherwise no obvious trauma to the face or head.    Right Ear: External ear normal.     Left Ear: External ear normal.     Nose: No rhinorrhea.     Mouth/Throat:     Mouth: Mucous membranes are moist.  Eyes:     General: No scleral icterus.       Right eye: No discharge.        Left eye: No discharge.     Extraocular Movements: Extraocular movements intact.     Conjunctiva/sclera: Conjunctivae normal.     Pupils: Pupils are equal, round, and reactive to light.  Neck:     Comments: No midline C-spine tenderness Cardiovascular:     Rate and Rhythm: Normal rate.   Pulmonary:     Effort: Pulmonary effort is normal. No respiratory distress.     Breath sounds: No wheezing.     Comments: Mild tenderness to palpation over the left-sided chest wall Chest:     Chest wall: Tenderness present.  Abdominal:     General: There is no distension.     Palpations: Abdomen is soft.     Tenderness: There is no abdominal tenderness. There is no guarding.  Musculoskeletal:        General: Tenderness and signs of injury present.     Cervical back: Neck supple. No rigidity or tenderness.  Comments: + Superficial abrasion over the right elbow as well as the second digit of the right hand.  No significant tenderness to palpation of bilateral shoulders, elbows, wrist, hips, knees, ankles.  Distal pulses intact.  Moving all extremities.  Extremities warm and well-perfused. + Old, well-healed scar over the left posterior scapula.  Otherwise no obvious trauma to the back.  No significant tenderness to palpation of the cervical, thoracic, or lumbar region or obvious trauma or step-off.  Skin:    General: Skin is warm and dry.     Comments: Abrasions as above  Neurological:     General: No focal deficit present.     Mental Status: He is alert.     Motor: No weakness.     Comments: Awake, alert, speech fluent, follows simple commands, able to hear spoken words in conversation  Psychiatric:        Mood and Affect: Mood normal.        Behavior: Behavior normal.     ED Course     Medical Decision Making This is a 76 year old male who presented to the ER after he fell off of a ladder.  Broad work-up was initiated on arrival and showed no evidence of severe traumatic injury.  He did have a scalp hematoma.  Patient and family were given head injury precautions.  Laboratory work-up, urinalysis, and CT imaging today showed multiple incidental findings which I discussed with the family as above.  Very strict return precautions were provided for new or worsening  symptoms  Calculus of gallbladder without cholecystitis without obstruction: chronic illness or injury Cerebral infarction, chronic: chronic illness or injury Fall, initial encounter: acute illness or injury Hematoma of scalp, initial encounter: acute illness or injury Microscopic hematuria: acute illness or injury Pulmonary emphysema, unspecified emphysema type (CMS-HCC): chronic illness or injury Pulmonary nodules: chronic illness or injury Amount and/or Complexity of Data Reviewed Independent Historian: spouse External Data Reviewed: radiology.    Details: Reviewed prior MRI report Labs: ordered. Decision-making details documented in ED Course. Radiology: ordered and independent interpretation performed. Decision-making details documented in ED Course. ECG/medicine tests: ordered and independent interpretation performed. Decision-making details documented in ED Course.   Risk OTC drugs.       Justine Anne Falcone, MD 08/10/21 1310

## 2021-08-12 ENCOUNTER — Telehealth: Payer: Self-pay | Admitting: Cardiology

## 2021-08-12 NOTE — Telephone Encounter (Signed)
Patient needs to be seen by pcp for work up and referral if needed to pulmonary. ? ? ?Left message to return call. ?

## 2021-08-12 NOTE — Telephone Encounter (Signed)
Wife of the patient called. The patient fell Saturday and went to [facility] in [place] to have some testing done. His wife states that the tests done at [facility] noted that the patient had two nodules in his lungs and a touch of COPD. The wife wanted to know if any further testing would be needed . ? ?The patient is scheduled to have some testing at [facility] 09/17/21. ? ?Please follow up with the patient's wife  ?

## 2021-08-13 ENCOUNTER — Ambulatory Visit: Payer: Medicare Other | Admitting: Endocrinology

## 2021-08-13 ENCOUNTER — Encounter: Payer: Self-pay | Admitting: Endocrinology

## 2021-08-13 VITALS — BP 144/82 | HR 82 | Ht 69.0 in | Wt 199.0 lb

## 2021-08-13 DIAGNOSIS — E1165 Type 2 diabetes mellitus with hyperglycemia: Secondary | ICD-10-CM | POA: Diagnosis not present

## 2021-08-13 DIAGNOSIS — E890 Postprocedural hypothyroidism: Secondary | ICD-10-CM | POA: Diagnosis not present

## 2021-08-13 LAB — GLUCOSE, RANDOM: Glucose, Bld: 159 mg/dL — ABNORMAL HIGH (ref 70–99)

## 2021-08-13 LAB — T4, FREE: Free T4: 0.91 ng/dL (ref 0.60–1.60)

## 2021-08-13 LAB — TSH: TSH: 0.76 u[IU]/mL (ref 0.35–5.50)

## 2021-08-13 NOTE — Telephone Encounter (Signed)
Informed wife Mardene Celeste) that she could wait till after he has his PFT & chest x-ray done.  If those test show anything significant he likely would refer her to pulmonology.  If can't wait till then, he could always see his pcp for workup.  She verbalized understanding.   ?

## 2021-08-13 NOTE — Progress Notes (Signed)
Patient ID: Duane Stevens, male   DOB: Feb 04, 1946, 76 y.o.   MRN: [id number] ? ? ?Reason for Appointment:  ?Endocrinology follow-up ? ? ? History of Present Illness:  ? ?HYPOTHYROIDISM was first diagnosed in 2017  ? ?Although he had to I-131 for toxic nodular goiter in 2012 he apparently did not get hypothyroid until 2017 ?The patient has been treated with variable doses of levothyroxine ?Initially was started on 50 mcg but on the last visit here in 2021 was on 75 mcg ?Not clear if he was symptomatic at that time ? ?At that time since TSH was 5.1 he was told to go on 100 mcg levothyroxine ? ?  ?Recently has no complaints of unusual fatigue, cold sensitivity ?He thinks he has gained weight although this is objectively stable        ?   ?He is usually taking the levothyroxine consistently every morning ? ?Although he thinks he has had thyroid levels checked with his PCP no results are available ? ?No visits with results within 1 Week(s) from this visit.  ?Latest known visit with results is:  ?Office Visit on 04/05/2020  ?Component Date Value Ref Range Status  ? TSH 04/05/2020 5.14 (H)  0.35 - 4.50 uIU/mL Final  ? Free T4 04/05/2020 0.71  0.60 - 1.60 ng/dL Final  ? Comment: Specimens from patients who are undergoing biotin therapy and /or ingesting biotin supplements may contain high levels of biotin.  The higher biotin concentration in these specimens interferes with this Free T4 assay.  Specimens that contain high levels  ?of biotin may cause false high results for this Free T4 assay.  Please interpret results in light of the total clinical presentation of the patient.  ?  ? ? ?DIABETES: ? ?This has been mostly followed by his PCP ?He thinks he has had diabetes for over 10 years ? ?For several years has been on low-dose glipizide and metformin 1 g twice daily ?However his last A1c as of 3/23 was 7.7, likely about his previous levels have been ? ?He periodically checks his blood sugars and he thinks the recent range  has been 130-180, checking mostly in the morning ?No lab glucose available ? ?Wt Readings from Last 3 Encounters:  ?08/13/21 199 lb (90.3 kg)  ?07/30/21 201 lb 9.6 oz (91.4 kg)  ?04/05/20 199 lb (90.3 kg)  ? ? ? ?No results found for: HGBA1C ?Lab Results  ?Component Value Date  ? CREATININE 0.90 07/15/2018  ? ? ? ?Allergies as of 08/13/2021   ? ?   Reactions  ? Bee Venom Hives, Swelling  ? Yellow Jacket  ? Oxycodone Hcl Itching  ? Penicillins   ? Did it involve swelling of the face/tongue/throat, SOB, or low BP? Unknown ?Did it involve sudden or severe rash/hives, skin peeling, or any reaction on the inside of your mouth or nose? Unknown ?Did you need to seek medical attention at a hospital or doctor's office? Unknown ?When did it last happen?   Over 10 years    ?If all above answers are ?NO?, may proceed with cephalosporin use.  ? ?  ? ?  ?Medication List  ?  ? ?  ? Accurate as of August 13, 2021 10:57 AM. If you have any questions, ask your nurse or doctor.  ?  ?  ? ?  ? ?aspirin 81 MG tablet ?Take 81 mg by mouth every evening. ?  ?fluticasone 50 MCG/ACT nasal spray ?Commonly known as: FLONASE ?Place 2 sprays into  both nostrils daily as needed for allergies. ?  ?furosemide 20 MG tablet ?Commonly known as: LASIX ?Take 2 tablets (40 mg total) by mouth as needed for edema (swelling). ?  ?glipiZIDE 2.5 MG 24 hr tablet ?Commonly known as: GLUCOTROL XL ?Take 2.5 mg by mouth daily. ?  ?levothyroxine 100 MCG tablet ?Commonly known as: SYNTHROID ?Take 1 tablet (100 mcg total) by mouth daily. ?  ?losartan 25 MG tablet ?Commonly known as: COZAAR ?Take 25 mg by mouth daily. ?  ?meclizine 25 MG tablet ?Commonly known as: ANTIVERT ?Take 1 tablet (25 mg total) by mouth 3 (three) times daily as needed for dizziness. May cause drowsiness ?  ?metFORMIN 500 MG tablet ?Commonly known as: GLUCOPHAGE ?Take 1,000 mg by mouth 2 (two) times daily. ?  ?rOPINIRole 0.5 MG tablet ?Commonly known as: REQUIP ?Take by mouth. ?  ?simvastatin 20 MG  tablet ?Commonly known as: ZOCOR ?Take 20 mg by mouth every evening. ?  ?Turmeric 500 MG Tabs ?Take 500 mg by mouth every evening. ?  ?Vitamin D3 25 MCG (1000 UT) Caps ?Take 1 capsule by mouth every evening. ?  ? ?  ? ? ?Allergies:  ?Allergies  ?Allergen Reactions  ? Bee Venom Hives and Swelling  ?  Yellow Jacket  ? Oxycodone Hcl Itching  ? Penicillins   ?  Did it involve swelling of the face/tongue/throat, SOB, or low BP? Unknown ?Did it involve sudden or severe rash/hives, skin peeling, or any reaction on the inside of your mouth or nose? Unknown ?Did you need to seek medical attention at a hospital or doctor's office? Unknown ?When did it last happen?   Over 10 years    ?If all above answers are ?NO?, may proceed with cephalosporin use. ?  ? ? ?Past Medical History:  ?Diagnosis Date  ? Allergy   ? Arthritis   ? Diabetes mellitus   ? Hypercholesterolemia   ? Hypertension   ? Hypothyroidism   ? Prostate cancer ([place])   ? hx of  ? Thyroid disease   ? ? ?Past Surgical History:  ?Procedure Laterality Date  ? ANKLE SURGERY    ? BACK SURGERY    ? HERNIA REPAIR    ? PROSTATECTOMY    ? ? ?Family History  ?Problem Relation Age of Onset  ? Cancer Other   ?     FH of Lung Cancer  ? Cancer Other   ?     FH of Prostate Cancer  ? Diabetes Other   ?     Parent  ? Heart disease Other   ?     Parent  ? ? ?Social History:  reports that he has quit smoking. He has never used smokeless tobacco. He reports that he does not drink alcohol and does not use drugs. ? ?Review of Systems  ?Constitutional:   ?     Patient thinks he has gained weight  ?HENT:  Negative for trouble swallowing.   ?Cardiovascular:  Negative for leg swelling.  ? ?He has been on treatment from his PCP for hypercholesterolemia ? ??  Hypertension: He is only on 25 mg losartan ? ? Examination: ?  ?BP (!) 144/82   Pulse 82   Ht '5\' 9"'$  (1.753 m)   Wt 199 lb (90.3 kg)   SpO2 96%   BMI 29.39 kg/m?  ? ?GENERAL APPEARANCE: Alert, no puffiness of the face or eyes ? ? NECK:  Thyroid is not palpable          ?  NEUROLOGIC EXAM:  biceps reflexes show normal relaxation on the left ?Skin: Not unusual dry ?No ankle edema present ? ?  Assessment  ? ?Hypothyroidism secondary to radioactive iodine treatment in the past ?He is taking 100 mcg levothyroxine which is a modest dose for his weight ?He has taken this regularly and is not complaining of fatigue ? ?Although previously had a palpable nodule on the left this is not palpable today and likely part of the multinodular goiter ? ?DIABETES: His last A1c was 7.7 ?Discussed that he likely has postprandial hyperglycemia which she does not monitor ?Also long-term he will benefit from SGLT2 drugs or GLP-1 drugs for cardiovascular risk reduction, better control of diabetes with A1c target of 7% ?He is reluctant to take any injections however ? ? ? Treatment: ? ?Check thyroid levels today ?If thyroid levels are normal he can come back annually ? ?Recommended that he discuss [place] with his PCP in place of glipizide for multiple benefits ?Given patient information on Farxiga ?Discussed action of SGLT 2 drugs on lowering glucose by decreasing kidney absorption of glucose, benefits of weight loss and lower blood pressure, possible side effects such as fluid loss ? ?Elayne Snare ?08/13/2021, 10:57 AM  ? ?Note: This office note was prepared with Estate agent. Any transcriptional errors that result from this process are unintentional. ?  ?

## 2021-08-14 DIAGNOSIS — M2569 Stiffness of other specified joint, not elsewhere classified: Secondary | ICD-10-CM | POA: Diagnosis not present

## 2021-08-14 DIAGNOSIS — R42 Dizziness and giddiness: Secondary | ICD-10-CM | POA: Diagnosis not present

## 2021-08-14 DIAGNOSIS — R2681 Unsteadiness on feet: Secondary | ICD-10-CM | POA: Diagnosis not present

## 2021-08-16 DIAGNOSIS — R42 Dizziness and giddiness: Secondary | ICD-10-CM | POA: Diagnosis not present

## 2021-08-16 DIAGNOSIS — M2569 Stiffness of other specified joint, not elsewhere classified: Secondary | ICD-10-CM | POA: Diagnosis not present

## 2021-08-16 DIAGNOSIS — R2681 Unsteadiness on feet: Secondary | ICD-10-CM | POA: Diagnosis not present

## 2021-08-21 DIAGNOSIS — M2569 Stiffness of other specified joint, not elsewhere classified: Secondary | ICD-10-CM | POA: Diagnosis not present

## 2021-08-21 DIAGNOSIS — R2681 Unsteadiness on feet: Secondary | ICD-10-CM | POA: Diagnosis not present

## 2021-08-21 DIAGNOSIS — R42 Dizziness and giddiness: Secondary | ICD-10-CM | POA: Diagnosis not present

## 2021-08-23 DIAGNOSIS — M2569 Stiffness of other specified joint, not elsewhere classified: Secondary | ICD-10-CM | POA: Diagnosis not present

## 2021-08-23 DIAGNOSIS — R2681 Unsteadiness on feet: Secondary | ICD-10-CM | POA: Diagnosis not present

## 2021-08-23 DIAGNOSIS — R42 Dizziness and giddiness: Secondary | ICD-10-CM | POA: Diagnosis not present

## 2021-08-28 DIAGNOSIS — R2681 Unsteadiness on feet: Secondary | ICD-10-CM | POA: Diagnosis not present

## 2021-08-28 DIAGNOSIS — R42 Dizziness and giddiness: Secondary | ICD-10-CM | POA: Diagnosis not present

## 2021-08-28 DIAGNOSIS — M2569 Stiffness of other specified joint, not elsewhere classified: Secondary | ICD-10-CM | POA: Diagnosis not present

## 2021-09-17 ENCOUNTER — Ambulatory Visit (HOSPITAL_COMMUNITY)
Admission: RE | Admit: 2021-09-17 | Discharge: 2021-09-17 | Disposition: A | Discharge status: Home / Self Care | Payer: Medicare Other | Source: Ambulatory Visit | Attending: Cardiology | Admitting: Cardiology

## 2021-09-17 DIAGNOSIS — R0602 Shortness of breath: Secondary | ICD-10-CM

## 2021-09-17 LAB — PULMONARY FUNCTION TEST
DL/VA % pred: 113 %
DL/VA: 4.51 ml/min/mmHg/L
DLCO unc % pred: 95 %
DLCO unc: 22.29 ml/min/mmHg
FEF 25-75 Post: 2.42 L/sec
FEF 25-75 Pre: 1.78 L/sec
FEF2575-%Change-Post: 35 %
FEF2575-%Pred-Post: 124 %
FEF2575-%Pred-Pre: 91 %
FEV1-%Change-Post: 7 %
FEV1-%Pred-Post: 99 %
FEV1-%Pred-Pre: 91 %
FEV1-Post: 2.71 L
FEV1-Pre: 2.52 L
FEV1FVC-%Change-Post: 0 %
FEV1FVC-%Pred-Pre: 101 %
FEV6-%Change-Post: 6 %
FEV6-%Pred-Post: 101 %
FEV6-%Pred-Pre: 94 %
FEV6-Post: 3.6 L
FEV6-Pre: 3.39 L
FEV6FVC-%Change-Post: 0 %
FEV6FVC-%Pred-Post: 104 %
FEV6FVC-%Pred-Pre: 105 %
FVC-%Change-Post: 7 %
FVC-%Pred-Post: 96 %
FVC-%Pred-Pre: 90 %
FVC-Post: 3.69 L
FVC-Pre: 3.44 L
Post FEV1/FVC ratio: 74 %
Post FEV6/FVC ratio: 98 %
Pre FEV1/FVC ratio: 73 %
Pre FEV6/FVC Ratio: 98 %

## 2021-09-17 MED ORDER — ALBUTEROL SULFATE (2.5 MG/3ML) 0.083% IN NEBU
2.5000 mg | INHALATION_SOLUTION | Freq: Once | RESPIRATORY_TRACT | Status: AC
  Administered 2021-09-17: 2.5 mg via RESPIRATORY_TRACT

## 2021-09-19 ENCOUNTER — Telehealth: Payer: Self-pay | Admitting: Cardiology

## 2021-09-19 NOTE — Telephone Encounter (Signed)
Pt's wife is checking on results for his PFT and the Chest XRay he had done.   Please call [phone]

## 2021-09-19 NOTE — Telephone Encounter (Signed)
Notified wife Mardene Celeste) that test results are on Dr. Nelly Laurence desktop awaiting his final review.  Will notify as soon as available.

## 2021-09-20 DIAGNOSIS — E7849 Other hyperlipidemia: Secondary | ICD-10-CM | POA: Diagnosis not present

## 2021-09-20 DIAGNOSIS — E11319 Type 2 diabetes mellitus with unspecified diabetic retinopathy without macular edema: Secondary | ICD-10-CM | POA: Diagnosis not present

## 2021-09-20 DIAGNOSIS — E039 Hypothyroidism, unspecified: Secondary | ICD-10-CM | POA: Diagnosis not present

## 2021-09-20 DIAGNOSIS — D649 Anemia, unspecified: Secondary | ICD-10-CM | POA: Diagnosis not present

## 2021-09-20 DIAGNOSIS — D519 Vitamin B12 deficiency anemia, unspecified: Secondary | ICD-10-CM | POA: Diagnosis not present

## 2021-09-20 DIAGNOSIS — E782 Mixed hyperlipidemia: Secondary | ICD-10-CM | POA: Diagnosis not present

## 2021-09-20 DIAGNOSIS — E559 Vitamin D deficiency, unspecified: Secondary | ICD-10-CM | POA: Diagnosis not present

## 2021-09-23 NOTE — Telephone Encounter (Signed)
Just resulted to Duane Safer Betzabeth Derringer MD

## 2021-09-23 NOTE — Telephone Encounter (Signed)
Laurine Blazer, LPN  04/29/3788  2:40 PM EDT Back to Top    Notified wife Mardene Celeste), copy to pcp.

## 2021-09-23 NOTE — Telephone Encounter (Signed)
PFT -   Arnoldo Lenis, MD  09/23/2021  3:35 PM EDT     Minimal lung changes on breathing test, overall looks good. Lung testing looks good. May f/u with pcp to consider other causes but cardiac and lung testing have overall looked good. Can f/u with [place] just as needed   Zandra Abts MD   [place], MD  09/23/2021  3:32 PM EDT     Normal chest xray   Zandra Abts MD

## 2021-09-25 DIAGNOSIS — R0600 Dyspnea, unspecified: Secondary | ICD-10-CM | POA: Diagnosis not present

## 2021-09-25 DIAGNOSIS — E1122 Type 2 diabetes mellitus with diabetic chronic kidney disease: Secondary | ICD-10-CM | POA: Diagnosis not present

## 2021-09-25 DIAGNOSIS — I5032 Chronic diastolic (congestive) heart failure: Secondary | ICD-10-CM | POA: Diagnosis not present

## 2021-09-25 DIAGNOSIS — I739 Peripheral vascular disease, unspecified: Secondary | ICD-10-CM | POA: Diagnosis not present

## 2021-09-25 DIAGNOSIS — E039 Hypothyroidism, unspecified: Secondary | ICD-10-CM | POA: Diagnosis not present

## 2021-09-25 DIAGNOSIS — E7849 Other hyperlipidemia: Secondary | ICD-10-CM | POA: Diagnosis not present

## 2021-10-08 ENCOUNTER — Other Ambulatory Visit: Payer: Self-pay

## 2021-10-08 DIAGNOSIS — E890 Postprocedural hypothyroidism: Secondary | ICD-10-CM

## 2021-10-08 MED ORDER — LEVOTHYROXINE SODIUM 100 MCG PO TABS: 100.0000 ug | ORAL_TABLET | Freq: Every day | ORAL | 3 refills | Status: DC

## 2021-12-26 DIAGNOSIS — E11319 Type 2 diabetes mellitus with unspecified diabetic retinopathy without macular edema: Secondary | ICD-10-CM | POA: Diagnosis not present

## 2021-12-26 DIAGNOSIS — E1165 Type 2 diabetes mellitus with hyperglycemia: Secondary | ICD-10-CM | POA: Diagnosis not present

## 2022-01-01 DIAGNOSIS — E7849 Other hyperlipidemia: Secondary | ICD-10-CM | POA: Diagnosis not present

## 2022-01-01 DIAGNOSIS — L819 Disorder of pigmentation, unspecified: Secondary | ICD-10-CM | POA: Diagnosis not present

## 2022-01-01 DIAGNOSIS — Z23 Encounter for immunization: Secondary | ICD-10-CM | POA: Diagnosis not present

## 2022-01-01 DIAGNOSIS — E1122 Type 2 diabetes mellitus with diabetic chronic kidney disease: Secondary | ICD-10-CM | POA: Diagnosis not present

## 2022-01-01 DIAGNOSIS — E039 Hypothyroidism, unspecified: Secondary | ICD-10-CM | POA: Diagnosis not present

## 2022-01-01 DIAGNOSIS — R030 Elevated blood-pressure reading, without diagnosis of hypertension: Secondary | ICD-10-CM | POA: Diagnosis not present

## 2022-01-04 ENCOUNTER — Other Ambulatory Visit: Payer: Self-pay | Admitting: Cardiology

## 2022-02-20 DIAGNOSIS — H59811 Chorioretinal scars after surgery for detachment, right eye: Secondary | ICD-10-CM | POA: Diagnosis not present

## 2022-02-20 DIAGNOSIS — H524 Presbyopia: Secondary | ICD-10-CM | POA: Diagnosis not present

## 2022-03-27 DIAGNOSIS — E559 Vitamin D deficiency, unspecified: Secondary | ICD-10-CM | POA: Diagnosis not present

## 2022-03-27 DIAGNOSIS — D529 Folate deficiency anemia, unspecified: Secondary | ICD-10-CM | POA: Diagnosis not present

## 2022-03-27 DIAGNOSIS — E782 Mixed hyperlipidemia: Secondary | ICD-10-CM | POA: Diagnosis not present

## 2022-03-27 DIAGNOSIS — E1122 Type 2 diabetes mellitus with diabetic chronic kidney disease: Secondary | ICD-10-CM | POA: Diagnosis not present

## 2022-03-28 DIAGNOSIS — E559 Vitamin D deficiency, unspecified: Secondary | ICD-10-CM | POA: Diagnosis not present

## 2022-03-28 DIAGNOSIS — D529 Folate deficiency anemia, unspecified: Secondary | ICD-10-CM | POA: Diagnosis not present

## 2022-03-28 DIAGNOSIS — E782 Mixed hyperlipidemia: Secondary | ICD-10-CM | POA: Diagnosis not present

## 2022-03-28 DIAGNOSIS — E1122 Type 2 diabetes mellitus with diabetic chronic kidney disease: Secondary | ICD-10-CM | POA: Diagnosis not present

## 2022-04-02 DIAGNOSIS — E049 Nontoxic goiter, unspecified: Secondary | ICD-10-CM | POA: Diagnosis not present

## 2022-04-02 DIAGNOSIS — R030 Elevated blood-pressure reading, without diagnosis of hypertension: Secondary | ICD-10-CM | POA: Diagnosis not present

## 2022-04-02 DIAGNOSIS — E1169 Type 2 diabetes mellitus with other specified complication: Secondary | ICD-10-CM | POA: Diagnosis not present

## 2022-04-02 DIAGNOSIS — Z23 Encounter for immunization: Secondary | ICD-10-CM | POA: Diagnosis not present

## 2022-04-02 DIAGNOSIS — E7849 Other hyperlipidemia: Secondary | ICD-10-CM | POA: Diagnosis not present

## 2022-04-02 DIAGNOSIS — E039 Hypothyroidism, unspecified: Secondary | ICD-10-CM | POA: Diagnosis not present

## 2022-04-22 DIAGNOSIS — Z23 Encounter for immunization: Secondary | ICD-10-CM | POA: Diagnosis not present

## 2022-04-22 DIAGNOSIS — R030 Elevated blood-pressure reading, without diagnosis of hypertension: Secondary | ICD-10-CM | POA: Diagnosis not present

## 2022-04-22 DIAGNOSIS — E039 Hypothyroidism, unspecified: Secondary | ICD-10-CM | POA: Diagnosis not present

## 2022-04-22 DIAGNOSIS — M159 Polyosteoarthritis, unspecified: Secondary | ICD-10-CM | POA: Diagnosis not present

## 2022-04-22 DIAGNOSIS — E1169 Type 2 diabetes mellitus with other specified complication: Secondary | ICD-10-CM | POA: Diagnosis not present

## 2022-07-11 ENCOUNTER — Other Ambulatory Visit: Payer: Self-pay | Admitting: Endocrinology

## 2022-07-11 DIAGNOSIS — E890 Postprocedural hypothyroidism: Secondary | ICD-10-CM

## 2022-07-23 ENCOUNTER — Encounter (INDEPENDENT_AMBULATORY_CARE_PROVIDER_SITE_OTHER): Payer: Medicare Other | Admitting: Ophthalmology

## 2022-07-30 ENCOUNTER — Encounter (INDEPENDENT_AMBULATORY_CARE_PROVIDER_SITE_OTHER): Payer: Medicare Other | Admitting: Ophthalmology

## 2022-08-11 ENCOUNTER — Encounter (INDEPENDENT_AMBULATORY_CARE_PROVIDER_SITE_OTHER): Payer: Medicare Other | Admitting: Ophthalmology

## 2022-08-11 DIAGNOSIS — H33301 Unspecified retinal break, right eye: Secondary | ICD-10-CM

## 2022-08-11 DIAGNOSIS — H33102 Unspecified retinoschisis, left eye: Secondary | ICD-10-CM

## 2022-08-11 DIAGNOSIS — H43813 Vitreous degeneration, bilateral: Secondary | ICD-10-CM | POA: Diagnosis not present

## 2022-08-12 ENCOUNTER — Ambulatory Visit: Payer: Medicare Other | Admitting: Endocrinology

## 2022-09-16 NOTE — Progress Notes (Unsigned)
Patient ID: Duane Stevens, male   DOB: 1945-10-30, 77 y.o.   MRN: [id number]   Reason for Appointment:   Endocrinology follow-up    History of Present Illness:   HYPOTHYROIDISM was first diagnosed in 2017   Although he had I-131 for toxic nodular goiter in 2012 he apparently did not get hypothyroid until 2017 Not clear if he was symptomatic at that time  The patient has been treated with variable doses of levothyroxine in the past Initially was started on 50 mcg and this has been progressively increased When his TSH was 5.1 he was told to go to 100 mcg levothyroxine dosage   He feels good and his only complaints are feeling sleepy if he is sitting down He is mostly active in the morning and tends to rest in the afternoons No recent weight change          He is usually taking the levothyroxine consistently every morning about an hour before eating at least  Lab Results  Component Value Date   TSH 0.76 08/13/2021   TSH 5.14 (H) 04/05/2020   TSH 2.89 02/16/2017   FREET4 0.91 08/13/2021   FREET4 0.71 04/05/2020   FREET4 0.60 12/01/2013     DIABETES:  This has been followed by his PCP He thinks he has had diabetes for over 10 years  For several years has been on low-dose glipizide and metformin 1 g twice daily  No reports however what he thinks his A1c was 7.2 last   Wt Readings from Last 3 Encounters:  09/17/22 197 lb 12.8 oz (89.7 kg)  08/13/21 199 lb (90.3 kg)  07/30/21 201 lb 9.6 oz (91.4 kg)     No results found for: "HGBA1C" Lab Results  Component Value Date   CREATININE 0.90 07/15/2018     Allergies as of 09/17/2022       Reactions   Bee Venom Hives, Swelling   Yellow Jacket   Oxycodone Hcl Itching   Penicillins    Did it involve swelling of the face/tongue/throat, SOB, or low BP? Unknown Did it involve sudden or severe rash/hives, skin peeling, or any reaction on the inside of your mouth or nose? Unknown Did you need to seek  medical attention at a hospital or doctor's office? Unknown When did it last happen?   Over 10 years    If all above answers are "NO", may proceed with cephalosporin use.        Medication List        Accurate as of Sep 17, 2022  3:30 PM. If you have any questions, ask your nurse or doctor.          aspirin 81 MG tablet Take 81 mg by mouth every evening.   etodolac 300 MG capsule Commonly known as: LODINE Take 300 mg by mouth 2 (two) times daily as needed.   fluticasone 50 MCG/ACT nasal spray Commonly known as: FLONASE Place 2 sprays into both nostrils daily as needed for allergies.   furosemide 20 MG tablet Commonly known as: LASIX TAKE 2 TABLETS BY MOUTH AS NEEDED FOR EDEMA (SWELLING) **THIS IS A DOSE INCREASE**   glipiZIDE 2.5 MG 24 hr tablet Commonly known as: GLUCOTROL XL Take 2.5 mg by mouth daily.   levothyroxine 100 MCG tablet Commonly known as: SYNTHROID TAKE 1 TABLET BY MOUTH DAILY   losartan 25 MG tablet Commonly known as: COZAAR Take 25 mg by mouth daily.   meclizine 25 MG tablet Commonly  known as: ANTIVERT Take 1 tablet (25 mg total) by mouth 3 (three) times daily as needed for dizziness. May cause drowsiness   metFORMIN 500 MG tablet Commonly known as: GLUCOPHAGE Take 1,000 mg by mouth 2 (two) times daily.   rOPINIRole 0.5 MG tablet Commonly known as: REQUIP Take by mouth.   simvastatin 20 MG tablet Commonly known as: ZOCOR Take 20 mg by mouth every evening.   Turmeric 500 MG Tabs Take 500 mg by mouth every evening.   Vitamin D3 25 MCG (1000 UT) Caps Take 1 capsule by mouth every evening.        Allergies:  Allergies  Allergen Reactions   Bee Venom Hives and Swelling    Yellow Jacket   Oxycodone Hcl Itching   Penicillins     Did it involve swelling of the face/tongue/throat, SOB, or low BP? Unknown Did it involve sudden or severe rash/hives, skin peeling, or any reaction on the inside of your mouth or nose? Unknown Did you  need to seek medical attention at a hospital or doctor's office? Unknown When did it last happen?   Over 10 years    If all above answers are "NO", may proceed with cephalosporin use.     Past Medical History:  Diagnosis Date   Allergy    Arthritis    Diabetes mellitus    Hypercholesterolemia    Hypertension    Hypothyroidism    Prostate cancer (HCC)    hx of   Thyroid disease     Past Surgical History:  Procedure Laterality Date   ANKLE SURGERY     BACK SURGERY     HERNIA REPAIR     PROSTATECTOMY      Family History  Problem Relation Age of Onset   Cancer Other        FH of Lung Cancer   Cancer Other        FH of Prostate Cancer   Diabetes Other        Parent   Heart disease Other        Parent    Social History:  reports that he has quit smoking. He has never used smokeless tobacco. He reports that he does not drink alcohol and does not use drugs.  Review of Systems  He has been on treatment from his PCP for hypercholesterolemia  ?  Hypertension: He is only on 25 mg losartan  He does have daytime sleepiness, does not think he has excessive snoring   Examination:   BP 138/68   Pulse 80   Ht 5\' 9"  (1.753 m)   Wt 197 lb 12.8 oz (89.7 kg)   SpO2 97%   BMI 29.21 kg/m   He looks well  Thyroid is not palpable           Right biceps reflexes show normal relaxation     Assessment   Hypothyroidism secondary to radioactive iodine treatment of toxic nodular goiter in the past He is taking 100 mcg levothyroxine which is a relatively low dose for his weight He is regular with his supplement Not clear if he has symptoms and his daytime sleepiness may be unrelated  DIABETES: Followed by PCP  Daytime somnolence: Discussed possibility of sleep apnea    Treatment:  Check thyroid levels today If thyroid levels are normal he can come back annually  Recommended that he discuss possible evaluation for sleep apnea with his PCP  Reather Littler 09/17/2022,  3:30 PM   Note: This  office note was prepared with Insurance underwriter. Any transcriptional errors that result from this process are unintentional.  Addendum: TSH 0.29 To continue levothyroxine 100 mcg daily except take only 1/2 tablet on Sundays.  Will need follow-up to recheck thyroid in about 4 months  Yeny Schmoll Lucianne Muss

## 2022-09-17 ENCOUNTER — Encounter: Payer: Self-pay | Admitting: Endocrinology

## 2022-09-17 ENCOUNTER — Ambulatory Visit: Payer: Medicare Other | Admitting: Endocrinology

## 2022-09-17 VITALS — BP 138/68 | HR 80 | Ht 69.0 in | Wt 197.8 lb

## 2022-09-17 DIAGNOSIS — E890 Postprocedural hypothyroidism: Secondary | ICD-10-CM | POA: Diagnosis not present

## 2022-09-17 LAB — T4, FREE: Free T4: 1.04 ng/dL (ref 0.60–1.60)

## 2022-09-17 LAB — TSH: TSH: 0.29 u[IU]/mL — ABNORMAL LOW (ref 0.35–5.50)

## 2022-09-19 DIAGNOSIS — E1165 Type 2 diabetes mellitus with hyperglycemia: Secondary | ICD-10-CM | POA: Diagnosis not present

## 2022-09-19 DIAGNOSIS — E7849 Other hyperlipidemia: Secondary | ICD-10-CM | POA: Diagnosis not present

## 2022-09-19 DIAGNOSIS — R5383 Other fatigue: Secondary | ICD-10-CM | POA: Diagnosis not present

## 2022-09-19 DIAGNOSIS — E039 Hypothyroidism, unspecified: Secondary | ICD-10-CM | POA: Diagnosis not present

## 2022-09-30 DIAGNOSIS — R030 Elevated blood-pressure reading, without diagnosis of hypertension: Secondary | ICD-10-CM | POA: Diagnosis not present

## 2022-09-30 DIAGNOSIS — E785 Hyperlipidemia, unspecified: Secondary | ICD-10-CM | POA: Diagnosis not present

## 2022-09-30 DIAGNOSIS — E039 Hypothyroidism, unspecified: Secondary | ICD-10-CM | POA: Diagnosis not present

## 2022-09-30 DIAGNOSIS — E1169 Type 2 diabetes mellitus with other specified complication: Secondary | ICD-10-CM | POA: Diagnosis not present

## 2022-09-30 DIAGNOSIS — M159 Polyosteoarthritis, unspecified: Secondary | ICD-10-CM | POA: Diagnosis not present

## 2022-09-30 DIAGNOSIS — Z0001 Encounter for general adult medical examination with abnormal findings: Secondary | ICD-10-CM | POA: Diagnosis not present

## 2022-09-30 DIAGNOSIS — E7849 Other hyperlipidemia: Secondary | ICD-10-CM | POA: Diagnosis not present

## 2022-09-30 DIAGNOSIS — M7989 Other specified soft tissue disorders: Secondary | ICD-10-CM | POA: Diagnosis not present

## 2022-10-31 DIAGNOSIS — I10 Essential (primary) hypertension: Secondary | ICD-10-CM | POA: Diagnosis not present

## 2022-10-31 DIAGNOSIS — L859 Epidermal thickening, unspecified: Secondary | ICD-10-CM | POA: Diagnosis not present

## 2022-10-31 DIAGNOSIS — R600 Localized edema: Secondary | ICD-10-CM | POA: Diagnosis not present

## 2022-10-31 DIAGNOSIS — I358 Other nonrheumatic aortic valve disorders: Secondary | ICD-10-CM | POA: Diagnosis not present

## 2022-12-17 DIAGNOSIS — U071 COVID-19: Secondary | ICD-10-CM | POA: Diagnosis not present

## 2022-12-17 DIAGNOSIS — R42 Dizziness and giddiness: Secondary | ICD-10-CM | POA: Diagnosis not present

## 2022-12-18 ENCOUNTER — Other Ambulatory Visit: Payer: Self-pay | Admitting: Endocrinology

## 2022-12-18 DIAGNOSIS — E890 Postprocedural hypothyroidism: Secondary | ICD-10-CM

## 2022-12-20 DIAGNOSIS — U071 COVID-19: Secondary | ICD-10-CM | POA: Diagnosis not present

## 2023-01-01 DIAGNOSIS — J329 Chronic sinusitis, unspecified: Secondary | ICD-10-CM | POA: Diagnosis not present

## 2023-01-01 DIAGNOSIS — J40 Bronchitis, not specified as acute or chronic: Secondary | ICD-10-CM | POA: Diagnosis not present

## 2023-01-01 DIAGNOSIS — R059 Cough, unspecified: Secondary | ICD-10-CM | POA: Diagnosis not present

## 2023-01-19 DIAGNOSIS — R5383 Other fatigue: Secondary | ICD-10-CM | POA: Diagnosis not present

## 2023-01-19 DIAGNOSIS — E039 Hypothyroidism, unspecified: Secondary | ICD-10-CM | POA: Diagnosis not present

## 2023-01-19 DIAGNOSIS — L299 Pruritus, unspecified: Secondary | ICD-10-CM | POA: Diagnosis not present

## 2023-01-19 DIAGNOSIS — Z23 Encounter for immunization: Secondary | ICD-10-CM | POA: Diagnosis not present

## 2023-02-24 DIAGNOSIS — H35033 Hypertensive retinopathy, bilateral: Secondary | ICD-10-CM | POA: Diagnosis not present

## 2023-02-24 DIAGNOSIS — H524 Presbyopia: Secondary | ICD-10-CM | POA: Diagnosis not present

## 2023-02-27 DIAGNOSIS — M170 Bilateral primary osteoarthritis of knee: Secondary | ICD-10-CM | POA: Diagnosis not present

## 2023-03-02 ENCOUNTER — Ambulatory Visit: Payer: Medicare Other | Admitting: Endocrinology

## 2023-03-02 ENCOUNTER — Encounter: Payer: Self-pay | Admitting: Endocrinology

## 2023-03-02 VITALS — BP 130/70 | HR 75 | Ht 69.0 in | Wt 193.2 lb

## 2023-03-02 DIAGNOSIS — E890 Postprocedural hypothyroidism: Secondary | ICD-10-CM | POA: Diagnosis not present

## 2023-03-02 LAB — TSH: TSH: 0.33 u[IU]/mL — ABNORMAL LOW (ref 0.35–5.50)

## 2023-03-02 LAB — T4, FREE: Free T4: 0.89 ng/dL (ref 0.60–1.60)

## 2023-03-02 MED ORDER — LEVOTHYROXINE SODIUM 88 MCG PO TABS: 88.0000 ug | ORAL_TABLET | Freq: Every day | ORAL | 3 refills | Status: DC

## 2023-03-02 NOTE — Progress Notes (Signed)
Outpatient Endocrinology Note [place] Lanee Chain, MD  03/02/23  Patient's Name: Duane Stevens    DOB: 07-Feb-1946    MRN: [id number]  REASON OF VISIT: Follow-up for hypothyroidism  REFERRING PROVIDER:   PCP: Juliette Alcide, MD  HISTORY OF PRESENT ILLNESS:   Duane Stevens is a 77 y.o. old male with past medical history as listed below is presented for a follow up of hypothyroidism.  Patient was last seen by Dr. Lucianne Muss in May 2024.  Pertinent Thyroid History: Patient was diagnosed with hypothyroidism in 2017.  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  Patient was treated with various doses of levothyroxine in the past.  Initially levothyroxine was started at 50 mcg daily and dose was adjusted periodically in the past.  In May 2024 patient had mildly low TSH and asked to decrease the dose of levothyroxine 100 mcg daily for 6 days a week and half tablet on seventh day.  # He has type 2 diabetes mellitus managed by primary care provider.  Interval history 03/02/23 Patient has been taking levothyroxine 100 mcg daily.  Patient stated he was asked to escape once a day in a week however he did that 1 time in the beginning and has been taking 1 tablet daily for a long time.  He denies palpitation or heat intolerance.  Denies constipation.  No other complaints today.     REVIEW OF SYSTEMS:  As per history of present illness.   PAST MEDICAL HISTORY: Past Medical History:  Diagnosis Date   Allergy    Arthritis    Diabetes mellitus    Hypercholesterolemia    Hypertension    Hypothyroidism    Prostate cancer (HCC)    hx of   Thyroid disease     PAST SURGICAL HISTORY: Past Surgical History:  Procedure Laterality Date   ANKLE SURGERY     BACK SURGERY     HERNIA REPAIR     PROSTATECTOMY      ALLERGIES: Allergies  Allergen Reactions   Bee Venom Hives and Swelling    Yellow Jacket   Oxycodone Hcl Itching    Penicillins     Did it involve swelling of the face/tongue/throat, SOB, or low BP? Unknown Did it involve sudden or severe rash/hives, skin peeling, or any reaction on the inside of your mouth or nose? Unknown Did you need to seek medical attention at a hospital or doctor's office? Unknown When did it last happen?   Over 10 years    If all above answers are "NO", may proceed with cephalosporin use.     FAMILY HISTORY:  Family History  Problem Relation Age of Onset   Cancer Other        FH of Lung Cancer   Cancer Other        FH of Prostate Cancer   Diabetes Other        Parent   Heart disease Other        Parent    SOCIAL HISTORY: Social History   Socioeconomic History   Marital status: Married    Spouse name: Not on file   Number of children: Not on file   Years of education: Not on file   Highest education level: Not on file  Occupational History   Occupation: Retired  Tobacco Use   Smoking status: Former   Smokeless tobacco: Never   Tobacco comments:    quit 20-30 years  ago  Vaping Use   Vaping status: Never Used  Substance and Sexual Activity   Alcohol use: No   Drug use: No   Sexual activity: Not Currently  Other Topics Concern   Not on file  Social History Narrative   Not on file   Social Determinants of Health   Financial Resource Strain: Not on file  Food Insecurity: Not on file  Transportation Needs: Not on file  Physical Activity: Not on file  Stress: Not on file  Social Connections: Not on file    MEDICATIONS:  Current Outpatient Medications  Medication Sig Dispense Refill   aspirin 81 MG tablet Take 81 mg by mouth every evening.     Cholecalciferol (VITAMIN D3) 1000 UNITS CAPS Take 1 capsule by mouth every evening.     etodolac (LODINE) 300 MG capsule Take 300 mg by mouth 2 (two) times daily as needed.     fluticasone (FLONASE) 50 MCG/ACT nasal spray Place 2 sprays into both nostrils daily as needed for allergies.     furosemide (LASIX) 20  MG tablet TAKE 2 TABLETS BY MOUTH AS NEEDED FOR EDEMA (SWELLING) **THIS IS A DOSE INCREASE** 60 tablet 6   glipiZIDE (GLUCOTROL XL) 2.5 MG 24 hr tablet Take 2.5 mg by mouth daily.     losartan (COZAAR) 25 MG tablet Take 25 mg by mouth daily.      meclizine (ANTIVERT) 25 MG tablet Take 1 tablet (25 mg total) by mouth 3 (three) times daily as needed for dizziness. May cause drowsiness 30 tablet 0   metFORMIN (GLUCOPHAGE) 500 MG tablet Take 1,000 mg by mouth 2 (two) times daily.     rOPINIRole (REQUIP) 0.5 MG tablet Take by mouth.     simvastatin (ZOCOR) 20 MG tablet Take 20 mg by mouth every evening.     Turmeric 500 MG TABS Take 500 mg by mouth every evening.      levothyroxine (SYNTHROID) 88 MCG tablet Take 1 tablet (88 mcg total) by mouth daily. 90 tablet 3   No current facility-administered medications for this visit.    PHYSICAL EXAM: Vitals:   03/02/23 1102  BP: 130/70  Pulse: 75  SpO2: 97%  Weight: 193 lb 3.2 oz (87.6 kg)  Height: 5\' 9"  (1.753 m)   Body mass index is 28.53 kg/m.  Wt Readings from Last 3 Encounters:  03/02/23 193 lb 3.2 oz (87.6 kg)  09/17/22 197 lb 12.8 oz (89.7 kg)  08/13/21 199 lb (90.3 kg)    General: Well developed, well nourished male in no apparent distress.  HEENT: AT/[place], no external lesions. Hearing intact to the spoken word Eyes: Conjunctiva clear and no icterus. Neck: Trachea midline, neck supple  Abdomen: Soft, non tender Neurologic: Alert, oriented, normal speech, deep tendon biceps reflexes normal,  no gross focal neurological deficit Extremities: No pedal pitting edema, no tremors of outstretched hands Skin: Warm, color good. Psychiatric: Does not appear depressed or anxious  PERTINENT HISTORIC LABORATORY AND IMAGING STUDIES:  All pertinent laboratory results were reviewed. Please see HPI also for further details.   TSH  Date Value Ref Range Status  03/02/2023 0.33 (L) 0.35 - 5.50 uIU/mL Final  09/17/2022 0.29 (L) 0.35 - 5.50 uIU/mL  Final  08/13/2021 0.76 0.35 - 5.50 uIU/mL Final     ASSESSMENT / PLAN  1. 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   -He has  been taking levothyroxine 100 mg daily. -No hypo and hyperthyroid symptoms.  He is clinically euthyroid today.  Plan: -Check thyroid function test TSH, free T4 and adjust the dose of levothyroxine.  Due to travel distance patient wants to follow-up hypothyroidism at [facility] endocrinology clinic or with primary care provider.  I asked patient to talk with his primary care provider if feels comfortable okay to manage by primary care provider as well.  Other option is he can also make a follow-up appointment with endocrinology clinic at [facility].   Labs reviewed mildly low TSH.  Normal free T4.  I would like to decrease levothyroxine to 88 mcg daily.  Check TSH, free T4 in 2 months.  Okay to complete lab with primary care provider and fax the report to our clinic.   Latest Reference Range & Units 03/02/23 11:34  TSH 0.35 - 5.50 uIU/mL 0.33 (L)  T4,Free(Direct) 0.60 - 1.60 ng/dL 1.61  (L): Data is abnormally low  Diagnoses and all orders for this visit:  Postablative hypothyroidism -     T4, free; Future -     TSH; Future -     TSH -     T4, free -     levothyroxine (SYNTHROID) 88 MCG tablet; Take 1 tablet (88 mcg total) by mouth daily. -     T4, free; Future -     TSH; Future    DISPOSITION Follow up in clinic in 4 months suggested.  All questions answered and patient verbalized understanding of the plan.  [place] Rusti Arizmendi, MD [facility] Endocrinology [facility] [address] [place], [address] [place], [place] [address] Phone # [phone]  At least part of this note was generated using voice recognition software. Inadvertent word errors may have occurred, which were not recognized during  the proofreading process.

## 2023-03-03 ENCOUNTER — Telehealth: Payer: Self-pay

## 2023-03-03 NOTE — Telephone Encounter (Signed)
Results and medication changes given to patient as directed by MD, no further questions from patient at this time.

## 2023-03-03 NOTE — Telephone Encounter (Signed)
-----   Message from Duane Stevens sent at 03/02/2023 11:00 PM EST ----- Please notify patient of Labs reviewed mildly low TSH.  Normal free T4.  I would like to decrease levothyroxine to 88 mcg daily.  I have sent new prescription for levothyroxine.  Check TSH, free T4 in 2 months.  Okay to complete lab with primary care provider and fax the report to our clinic.

## 2023-03-03 NOTE — Telephone Encounter (Signed)
-----   Message from [place] Thapa sent at 03/02/2023 11:00 PM EST ----- Please notify patient of Labs reviewed mildly low TSH.  Normal free T4.  I would like to decrease levothyroxine to 88 mcg daily.  I have sent new prescription for levothyroxine.  Check TSH, free T4 in 2 months.  Okay to complete lab with primary care provider and fax the report to our clinic.

## 2023-03-06 DIAGNOSIS — M170 Bilateral primary osteoarthritis of knee: Secondary | ICD-10-CM | POA: Diagnosis not present

## 2023-03-13 DIAGNOSIS — M170 Bilateral primary osteoarthritis of knee: Secondary | ICD-10-CM | POA: Diagnosis not present

## 2023-03-17 DIAGNOSIS — M170 Bilateral primary osteoarthritis of knee: Secondary | ICD-10-CM | POA: Diagnosis not present

## 2023-03-24 DIAGNOSIS — E1122 Type 2 diabetes mellitus with diabetic chronic kidney disease: Secondary | ICD-10-CM | POA: Diagnosis not present

## 2023-03-24 DIAGNOSIS — E1165 Type 2 diabetes mellitus with hyperglycemia: Secondary | ICD-10-CM | POA: Diagnosis not present

## 2023-03-24 DIAGNOSIS — E7849 Other hyperlipidemia: Secondary | ICD-10-CM | POA: Diagnosis not present

## 2023-03-24 DIAGNOSIS — E1151 Type 2 diabetes mellitus with diabetic peripheral angiopathy without gangrene: Secondary | ICD-10-CM | POA: Diagnosis not present

## 2023-03-24 DIAGNOSIS — E11319 Type 2 diabetes mellitus with unspecified diabetic retinopathy without macular edema: Secondary | ICD-10-CM | POA: Diagnosis not present

## 2023-03-31 DIAGNOSIS — E039 Hypothyroidism, unspecified: Secondary | ICD-10-CM | POA: Diagnosis not present

## 2023-03-31 DIAGNOSIS — R030 Elevated blood-pressure reading, without diagnosis of hypertension: Secondary | ICD-10-CM | POA: Diagnosis not present

## 2023-03-31 DIAGNOSIS — E1169 Type 2 diabetes mellitus with other specified complication: Secondary | ICD-10-CM | POA: Diagnosis not present

## 2023-03-31 DIAGNOSIS — Z1389 Encounter for screening for other disorder: Secondary | ICD-10-CM | POA: Diagnosis not present

## 2023-03-31 DIAGNOSIS — E7849 Other hyperlipidemia: Secondary | ICD-10-CM | POA: Diagnosis not present

## 2023-03-31 DIAGNOSIS — M159 Polyosteoarthritis, unspecified: Secondary | ICD-10-CM | POA: Diagnosis not present

## 2023-06-09 DIAGNOSIS — M9902 Segmental and somatic dysfunction of thoracic region: Secondary | ICD-10-CM | POA: Diagnosis not present

## 2023-06-09 DIAGNOSIS — M9903 Segmental and somatic dysfunction of lumbar region: Secondary | ICD-10-CM | POA: Diagnosis not present

## 2023-06-09 DIAGNOSIS — M9901 Segmental and somatic dysfunction of cervical region: Secondary | ICD-10-CM | POA: Diagnosis not present

## 2023-06-09 DIAGNOSIS — S233XXA Sprain of ligaments of thoracic spine, initial encounter: Secondary | ICD-10-CM | POA: Diagnosis not present

## 2023-06-09 DIAGNOSIS — S134XXA Sprain of ligaments of cervical spine, initial encounter: Secondary | ICD-10-CM | POA: Diagnosis not present

## 2023-06-09 DIAGNOSIS — S338XXA Sprain of other parts of lumbar spine and pelvis, initial encounter: Secondary | ICD-10-CM | POA: Diagnosis not present

## 2023-06-16 DIAGNOSIS — S134XXA Sprain of ligaments of cervical spine, initial encounter: Secondary | ICD-10-CM | POA: Diagnosis not present

## 2023-06-16 DIAGNOSIS — S338XXA Sprain of other parts of lumbar spine and pelvis, initial encounter: Secondary | ICD-10-CM | POA: Diagnosis not present

## 2023-06-16 DIAGNOSIS — M9902 Segmental and somatic dysfunction of thoracic region: Secondary | ICD-10-CM | POA: Diagnosis not present

## 2023-06-16 DIAGNOSIS — M9901 Segmental and somatic dysfunction of cervical region: Secondary | ICD-10-CM | POA: Diagnosis not present

## 2023-06-16 DIAGNOSIS — M9903 Segmental and somatic dysfunction of lumbar region: Secondary | ICD-10-CM | POA: Diagnosis not present

## 2023-06-16 DIAGNOSIS — S233XXA Sprain of ligaments of thoracic spine, initial encounter: Secondary | ICD-10-CM | POA: Diagnosis not present

## 2023-06-23 DIAGNOSIS — S338XXA Sprain of other parts of lumbar spine and pelvis, initial encounter: Secondary | ICD-10-CM | POA: Diagnosis not present

## 2023-06-23 DIAGNOSIS — M9902 Segmental and somatic dysfunction of thoracic region: Secondary | ICD-10-CM | POA: Diagnosis not present

## 2023-06-23 DIAGNOSIS — M9901 Segmental and somatic dysfunction of cervical region: Secondary | ICD-10-CM | POA: Diagnosis not present

## 2023-06-23 DIAGNOSIS — S134XXA Sprain of ligaments of cervical spine, initial encounter: Secondary | ICD-10-CM | POA: Diagnosis not present

## 2023-06-23 DIAGNOSIS — S233XXA Sprain of ligaments of thoracic spine, initial encounter: Secondary | ICD-10-CM | POA: Diagnosis not present

## 2023-06-23 DIAGNOSIS — M9903 Segmental and somatic dysfunction of lumbar region: Secondary | ICD-10-CM | POA: Diagnosis not present

## 2023-07-07 DIAGNOSIS — S233XXA Sprain of ligaments of thoracic spine, initial encounter: Secondary | ICD-10-CM | POA: Diagnosis not present

## 2023-07-07 DIAGNOSIS — S338XXA Sprain of other parts of lumbar spine and pelvis, initial encounter: Secondary | ICD-10-CM | POA: Diagnosis not present

## 2023-07-07 DIAGNOSIS — M9902 Segmental and somatic dysfunction of thoracic region: Secondary | ICD-10-CM | POA: Diagnosis not present

## 2023-07-07 DIAGNOSIS — S134XXA Sprain of ligaments of cervical spine, initial encounter: Secondary | ICD-10-CM | POA: Diagnosis not present

## 2023-07-07 DIAGNOSIS — M9901 Segmental and somatic dysfunction of cervical region: Secondary | ICD-10-CM | POA: Diagnosis not present

## 2023-07-07 DIAGNOSIS — R42 Dizziness and giddiness: Secondary | ICD-10-CM | POA: Diagnosis not present

## 2023-07-07 DIAGNOSIS — M9903 Segmental and somatic dysfunction of lumbar region: Secondary | ICD-10-CM | POA: Diagnosis not present

## 2023-08-12 ENCOUNTER — Encounter (INDEPENDENT_AMBULATORY_CARE_PROVIDER_SITE_OTHER): Payer: Medicare Other | Admitting: Ophthalmology

## 2023-08-12 DIAGNOSIS — H33102 Unspecified retinoschisis, left eye: Secondary | ICD-10-CM

## 2023-08-12 DIAGNOSIS — H33301 Unspecified retinal break, right eye: Secondary | ICD-10-CM | POA: Diagnosis not present

## 2023-08-12 DIAGNOSIS — H35372 Puckering of macula, left eye: Secondary | ICD-10-CM | POA: Diagnosis not present

## 2023-08-12 DIAGNOSIS — H43813 Vitreous degeneration, bilateral: Secondary | ICD-10-CM

## 2023-08-13 IMAGING — DX DG CHEST 2V
2 series · 2 of 2 positions shown · non-contrast
Comparison: CT C AP 08/10/2021

CLINICAL DATA: Shortness of breath

EXAM:
CHEST - 2 VIEW

[chest pa]
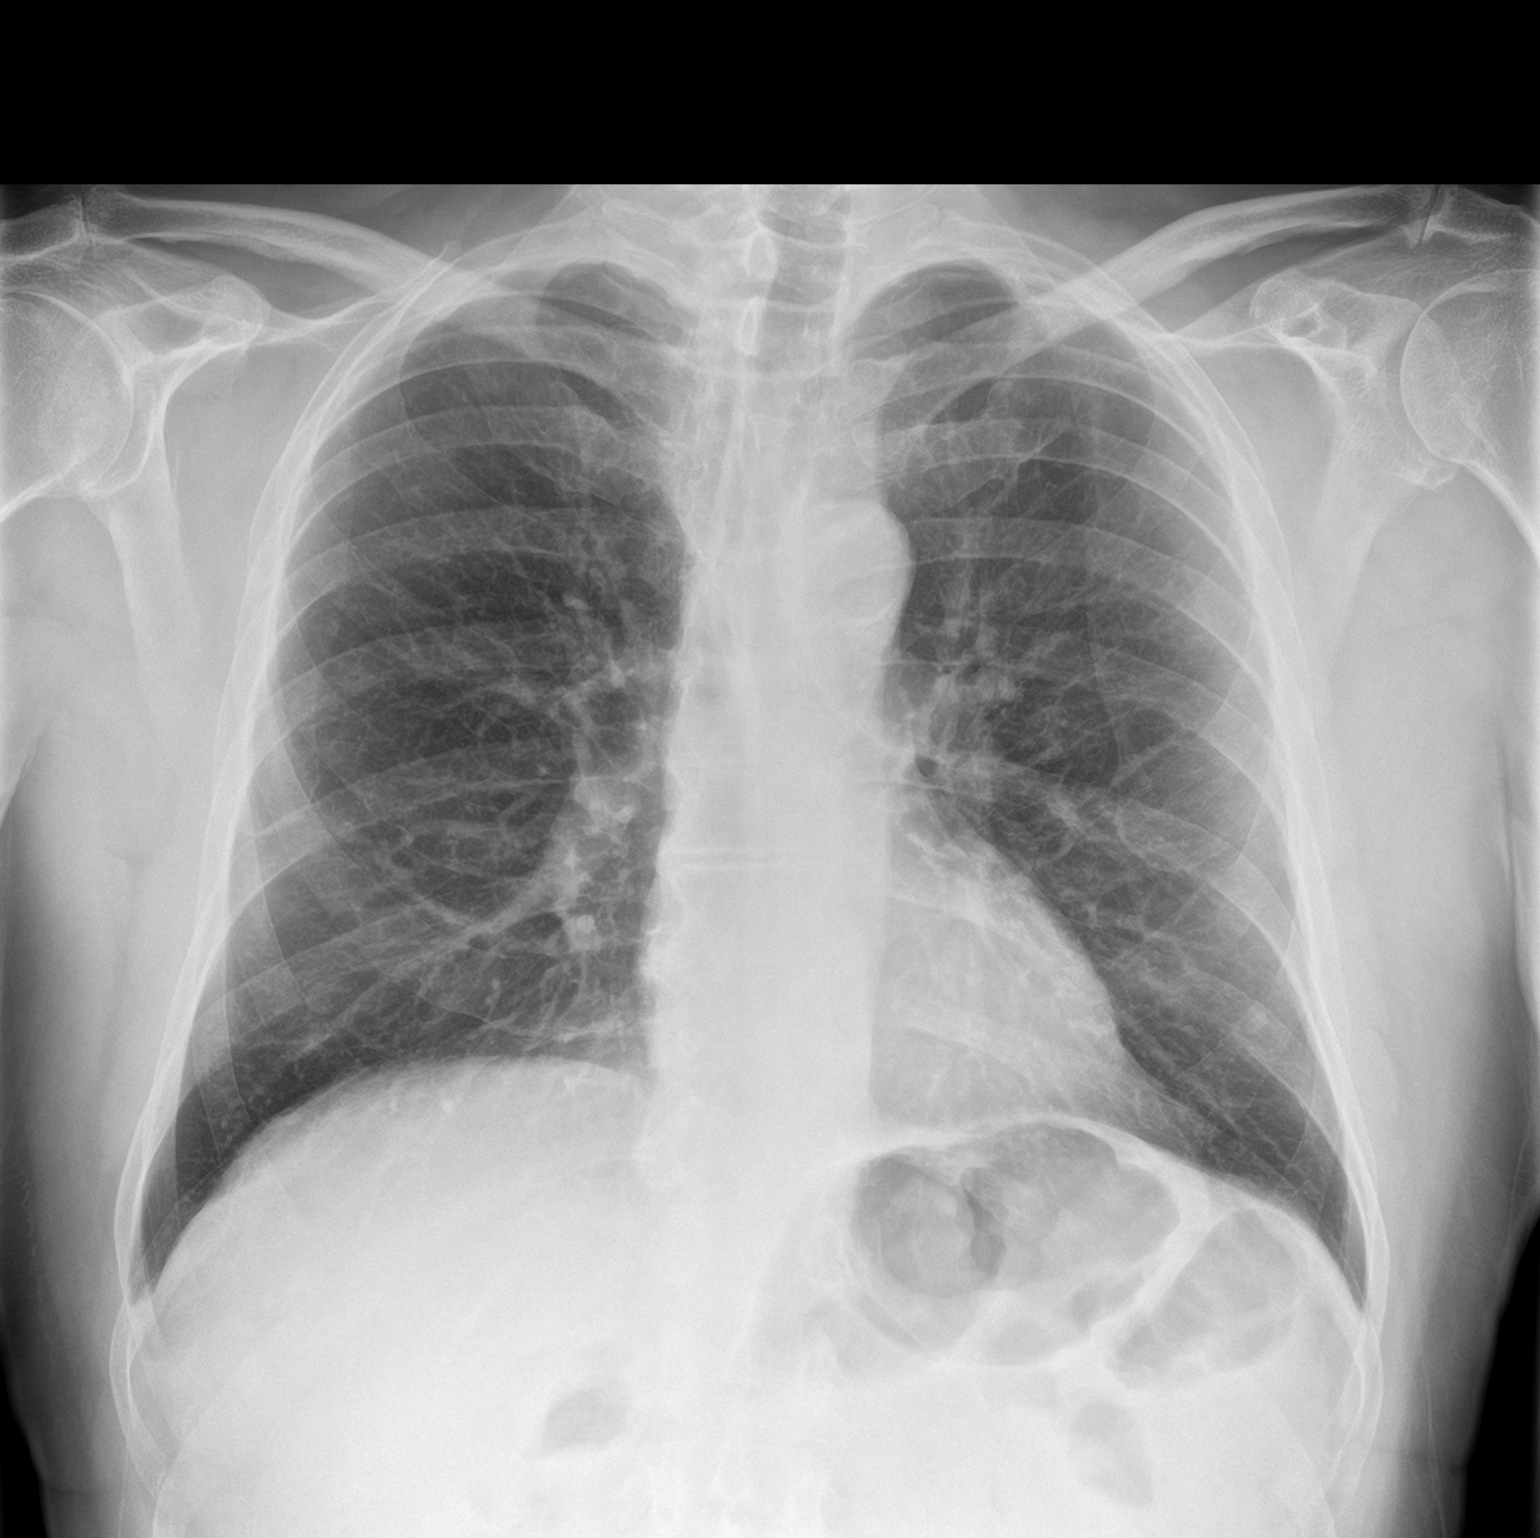

[chest lat]
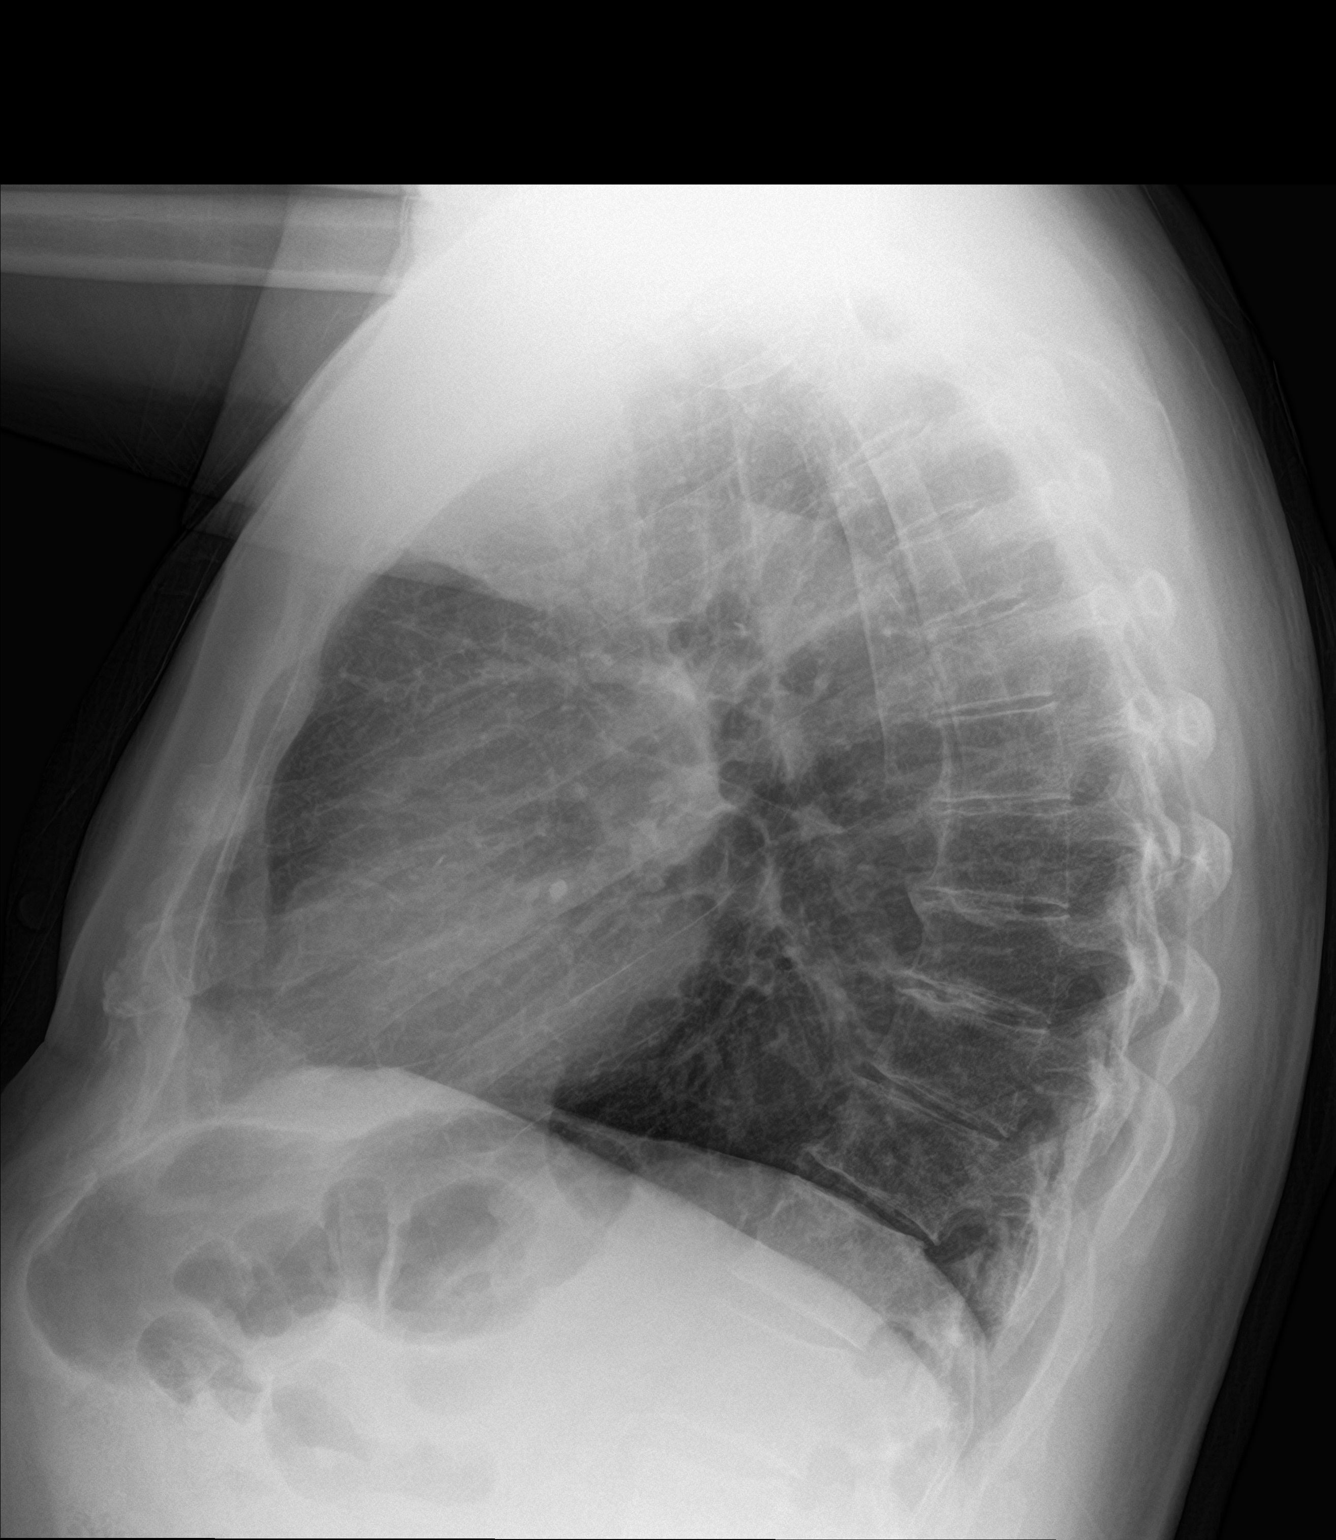

[2 of 2 positions shown; findings below may reference images not displayed]

FINDINGS: Stable cardiac and mediastinal contours. No large area pulmonary
consolidation. No pleural effusion pneumothorax. Thoracic spine
degenerative changes.
IMPRESSION: No active cardiopulmonary disease.

## 2023-08-24 ENCOUNTER — Encounter (INDEPENDENT_AMBULATORY_CARE_PROVIDER_SITE_OTHER): Admitting: Ophthalmology

## 2023-08-24 DIAGNOSIS — H33302 Unspecified retinal break, left eye: Secondary | ICD-10-CM

## 2023-09-08 ENCOUNTER — Encounter (INDEPENDENT_AMBULATORY_CARE_PROVIDER_SITE_OTHER): Admitting: Ophthalmology

## 2023-09-08 DIAGNOSIS — H33303 Unspecified retinal break, bilateral: Secondary | ICD-10-CM

## 2023-09-08 DIAGNOSIS — H2513 Age-related nuclear cataract, bilateral: Secondary | ICD-10-CM | POA: Diagnosis not present

## 2023-09-08 DIAGNOSIS — H33102 Unspecified retinoschisis, left eye: Secondary | ICD-10-CM

## 2023-09-08 DIAGNOSIS — H43813 Vitreous degeneration, bilateral: Secondary | ICD-10-CM

## 2023-11-18 DIAGNOSIS — R5383 Other fatigue: Secondary | ICD-10-CM | POA: Diagnosis not present

## 2023-11-18 DIAGNOSIS — R42 Dizziness and giddiness: Secondary | ICD-10-CM | POA: Diagnosis not present

## 2023-11-18 DIAGNOSIS — E11319 Type 2 diabetes mellitus with unspecified diabetic retinopathy without macular edema: Secondary | ICD-10-CM | POA: Diagnosis not present

## 2023-11-18 DIAGNOSIS — E7849 Other hyperlipidemia: Secondary | ICD-10-CM | POA: Diagnosis not present

## 2023-11-18 DIAGNOSIS — E1151 Type 2 diabetes mellitus with diabetic peripheral angiopathy without gangrene: Secondary | ICD-10-CM | POA: Diagnosis not present

## 2023-11-30 DIAGNOSIS — R600 Localized edema: Secondary | ICD-10-CM | POA: Diagnosis not present

## 2023-11-30 DIAGNOSIS — Z0001 Encounter for general adult medical examination with abnormal findings: Secondary | ICD-10-CM | POA: Diagnosis not present

## 2023-11-30 DIAGNOSIS — Z1389 Encounter for screening for other disorder: Secondary | ICD-10-CM | POA: Diagnosis not present

## 2023-11-30 DIAGNOSIS — R0600 Dyspnea, unspecified: Secondary | ICD-10-CM | POA: Diagnosis not present

## 2023-11-30 DIAGNOSIS — E11319 Type 2 diabetes mellitus with unspecified diabetic retinopathy without macular edema: Secondary | ICD-10-CM | POA: Diagnosis not present

## 2023-11-30 DIAGNOSIS — E1151 Type 2 diabetes mellitus with diabetic peripheral angiopathy without gangrene: Secondary | ICD-10-CM | POA: Diagnosis not present

## 2023-11-30 DIAGNOSIS — E1169 Type 2 diabetes mellitus with other specified complication: Secondary | ICD-10-CM | POA: Diagnosis not present

## 2023-12-01 DIAGNOSIS — M170 Bilateral primary osteoarthritis of knee: Secondary | ICD-10-CM | POA: Diagnosis not present

## 2023-12-22 DIAGNOSIS — I739 Peripheral vascular disease, unspecified: Secondary | ICD-10-CM | POA: Diagnosis not present

## 2023-12-22 DIAGNOSIS — E785 Hyperlipidemia, unspecified: Secondary | ICD-10-CM | POA: Diagnosis not present

## 2023-12-22 DIAGNOSIS — R0789 Other chest pain: Secondary | ICD-10-CM | POA: Diagnosis not present

## 2023-12-22 DIAGNOSIS — R0600 Dyspnea, unspecified: Secondary | ICD-10-CM | POA: Diagnosis not present

## 2023-12-22 DIAGNOSIS — R931 Abnormal findings on diagnostic imaging of heart and coronary circulation: Secondary | ICD-10-CM | POA: Diagnosis not present

## 2023-12-22 DIAGNOSIS — I10 Essential (primary) hypertension: Secondary | ICD-10-CM | POA: Diagnosis not present

## 2023-12-29 DIAGNOSIS — Z23 Encounter for immunization: Secondary | ICD-10-CM | POA: Diagnosis not present

## 2023-12-29 DIAGNOSIS — L570 Actinic keratosis: Secondary | ICD-10-CM | POA: Diagnosis not present

## 2023-12-30 NOTE — Progress Notes (Signed)
 Cardiology Office Note    Patient Name: Duane Stevens Date of Encounter: 12/30/2023  Primary Care Provider:  Lari Elspeth BRAVO, MD Primary Cardiologist:  Alvan Carrier, MD Primary Electrophysiologist: None   Past Medical History    Past Medical History:  Diagnosis Date   Allergy    Arthritis    Diabetes mellitus    Hypercholesterolemia    Hypertension    Hypothyroidism    Prostate cancer (HCC)    hx of   Thyroid  disease     History of Present Illness  Duane Stevens is a 78 y.o. male with a PMH of HLD, HTN, DM type II, hypothyroidism, former tobacco abuse, prostate CA who presents today for evaluation of nonsustained VT.  Duane Stevens was seen initially by Dr. Alvan on 10/03/2019 nuclear stress test that was abnormal.  Stress test revealed EF of 48% with apical infarct and was intermediate risk.  He completed an echocardiogram for further evaluate that showed EF of 60 to 65% with no RWMA normal valve function.  He was started on Lasix  20 mg for lower extremity edema.  He was last seen by Dr. Alvan on 07/30/2021 for follow-up visit.  During his visit blood pressure was at goal and EKG showed sinus rhythm with no ischemic changes.  He was continued on current medications with no changes at that time.  He completed an updated Lexiscan Myoview for chest discomfort and dyspnea on exertion that showed mild-moderate reversible defect in the basal inferior and mid inferior segments with EF of 65%.  Duane Stevens presents today with his wife for follow-up.He has been experiencing shortness of breath for the past six months, primarily during physical activity. He becomes breathless quickly when walking on flat ground and even more so when walking on an incline or upstairs. He can walk short distances, such as from a room to the front door, but not much further without becoming breathless. No shortness of breath while sitting.  A stress test at [facility] showed a small defect in the  heart muscle, but no significant changes were noted. The details of the stress test results are unclear as they are not fully accessible in the current system. No chest tightness or pain associated with the shortness of breath. No palpitations or fluttering sensations in his heart. He has a history of a heart attack a few years ago, which reportedly did not cause any damage, and he was unaware of it at the time. He experiences dizziness related to sudden stops and has a lifelong history of motion sickness. He reports swelling in his feet that does not improve overnight. He has received knee injections from a sports medicine doctor, which helped reduce swelling in his ankles, but the swelling in his feet persists. Patient denies chest pain, palpitations,  PND, orthopnea, nausea, vomiting, dizziness, syncope, edema, weight gain, or early satiety.  Discussed the use of AI scribe software for clinical note transcription with the patient, who gave verbal consent to proceed.  History of Present Illness   Review of Systems  Please see the history of present illness.    All other systems reviewed and are otherwise negative except as noted above.  Physical Exam    Wt Readings from Last 3 Encounters:  03/02/23 193 lb 3.2 oz (87.6 kg)  09/17/22 197 lb 12.8 oz (89.7 kg)  08/13/21 199 lb (90.3 kg)   CD:Uyzmz were no vitals filed for this visit.,There is no height or weight on file to calculate BMI. GEN:  Well nourished, well developed in no acute distress Neck: No JVD; No carotid bruits Pulmonary: Clear to auscultation without rales, wheezing or rhonchi  Cardiovascular: Normal rate. Regular rhythm. Normal S1. Normal S2.   Murmurs: There is no murmur.  ABDOMEN: Soft, non-tender, non-distended EXTREMITIES:  No edema; No deformity   EKG/LABS/ Recent Cardiac Studies   ECG personally reviewed by me today -sinus rhythm with rate of 86 bpm and no acute changes consistent with previous EKG.  Risk  Assessment/Calculations:          Lab Results  Component Value Date   WBC 13.2 (H) 07/15/2018   HGB 16.0 07/15/2018   HCT 48.2 07/15/2018   MCV 84.3 07/15/2018   PLT 224 07/15/2018   Lab Results  Component Value Date   CREATININE 0.90 07/15/2018   BUN 19 07/15/2018   NA 135 07/15/2018   K 3.8 07/15/2018   CL 99 07/15/2018   CO2 25 07/15/2018   No results found for: CHOL, HDL, LDLCALC, LDLDIRECT, TRIG, CHOLHDL  No results found for: HGBA1C Assessment & Plan    Assessment and Plan Assessment & Plan Exertional dyspnea with abnormal cardiac stress test (possible myocardial ischemia) Stress test indicated possible myocardial ischemia with a small defect. Differential includes artifact versus true ischemia. - Order heart catheterization at [facility]. - Perform pre-procedure lab work to check kidney function and blood counts. - Discuss heart catheterization consent and risks, including stroke (1 in 1000), death (1 in 1000), kidney failure (1 in 500), bleeding (1 in 200), and allergic reaction to dye (1 in 200). - Schedule heart catheterization for a convenient time, avoiding early morning.  Lower extremity edema Chronic edema possibly related to knee inflammation and previous injections, not improving with elevation or overnight. - Consider further evaluation if symptoms persist.  1.  Abnormal stress test: -Exertional dyspnea with abnormal cardiac stress test (possible myocardial ischemia) Stress test indicated possible myocardial ischemia with a small defect. Differential includes artifact versus true ischemia. - Will start patient on GDMT with Toprol -XL 25 mg nitroglycerin  0.4 mg as needed - Patient was advised to contact office if symptoms persist and will for further ischemic evaluation at that time.  2.  Hyperlipidemia: - Patient's last LDL cholesterol was 80 on 87/7976 - Continue Zocor 20 mg daily  3.  DM type II: - Patient's last hemoglobin A1c  was 7.1 - Continue Glucotrol and Glucophage per PCP  4.  Essential hypertension: - Patient's blood pressure today was well-controlled at 130/70 - Continue losartan 25 mg and Toprol -XL 25 mg  Disposition: Follow-up with Alvan Carrier, MD or APP in 1 months Informed Consent        Signed, Wyn Raddle, Jackee Shove, NP 12/30/2023, 5:58 PM [facility]

## 2023-12-31 ENCOUNTER — Encounter: Payer: Self-pay | Admitting: Nurse Practitioner

## 2023-12-31 ENCOUNTER — Ambulatory Visit: Attending: Nurse Practitioner | Admitting: Nurse Practitioner

## 2023-12-31 VITALS — BP 130/70 | HR 86 | Ht 69.0 in | Wt 194.0 lb

## 2023-12-31 DIAGNOSIS — I10 Essential (primary) hypertension: Secondary | ICD-10-CM

## 2023-12-31 DIAGNOSIS — E118 Type 2 diabetes mellitus with unspecified complications: Secondary | ICD-10-CM | POA: Diagnosis not present

## 2023-12-31 DIAGNOSIS — R9439 Abnormal result of other cardiovascular function study: Secondary | ICD-10-CM

## 2023-12-31 DIAGNOSIS — E785 Hyperlipidemia, unspecified: Secondary | ICD-10-CM | POA: Diagnosis not present

## 2023-12-31 MED ORDER — NITROGLYCERIN 0.4 MG SL SUBL: 0.4000 mg | SUBLINGUAL_TABLET | SUBLINGUAL | 3 refills | Status: AC | PRN

## 2023-12-31 MED ORDER — METOPROLOL SUCCINATE ER 25 MG PO TB24: 25.0000 mg | ORAL_TABLET | Freq: Every day | ORAL | 1 refills | Status: DC

## 2023-12-31 NOTE — Patient Instructions (Addendum)
 Medication Instructions:  START Toprol  XL 25mg  Take 1 tablet once a day  START Nitroglycerin  0.4mg  Take 1 as needed for emergency chest pain. Take first dose for emergency chest pain; WAIT 5 minutes and then take 2nd dose. IF still having pain if still having chest pain CALL 911 wait an additional 5 minutes before taking final dose. Do not take more than 3 doses in a day.  *If you need a refill on your cardiac medications before your next appointment, please call your pharmacy*  Lab Work: None ordered If you have labs (blood work) drawn today and your tests are completely normal, you will receive your results only by: MyChart Message (if you have MyChart) OR A paper copy in the mail If you have any lab test that is abnormal or we need to change your treatment, we will call you to review the results.  Testing/Procedures: None ordered  Follow-Up: At [facility], you and your health needs are our priority.  As part of our continuing mission to provide you with exceptional heart care, our providers are all part of one team.  This team includes your primary Cardiologist (physician) and Advanced Practice Providers or APPs (Physician Assistants and Nurse Practitioners) who all work together to provide you with the care you need, when you need it.  Your next appointment:   2-3 week(s)  Provider:   You may see Alvan Carrier, MD or one of the following Advanced Practice Providers on your designated Care Team:   Laymon Qua, PA-C  Scotesia Bolton, [place] Olivia Pavy, [place]     We recommend signing up for the patient portal called MyChart.  Sign up information is provided on this After Visit Summary.  MyChart is used to connect with patients for Virtual Visits (Telemedicine).  Patients are able to view lab/test results, encounter notes, upcoming appointments, etc.  Non-urgent messages can be sent to your provider as well.   To learn more about what you can do with MyChart, go to  [email or web address].   Other Instructions

## 2024-01-06 ENCOUNTER — Encounter (INDEPENDENT_AMBULATORY_CARE_PROVIDER_SITE_OTHER): Admitting: Ophthalmology

## 2024-01-06 DIAGNOSIS — H33303 Unspecified retinal break, bilateral: Secondary | ICD-10-CM

## 2024-01-06 DIAGNOSIS — H33102 Unspecified retinoschisis, left eye: Secondary | ICD-10-CM | POA: Diagnosis not present

## 2024-01-06 DIAGNOSIS — H43813 Vitreous degeneration, bilateral: Secondary | ICD-10-CM | POA: Diagnosis not present

## 2024-01-06 DIAGNOSIS — H2513 Age-related nuclear cataract, bilateral: Secondary | ICD-10-CM

## 2024-01-07 DIAGNOSIS — H10013 Acute follicular conjunctivitis, bilateral: Secondary | ICD-10-CM | POA: Diagnosis not present

## 2024-01-07 SURGERY — Surgical Case
Anesthesia: *Unknown

## 2024-02-02 DIAGNOSIS — H04123 Dry eye syndrome of bilateral lacrimal glands: Secondary | ICD-10-CM | POA: Diagnosis not present

## 2024-02-10 NOTE — H&P (View-Only) (Signed)
 Cardiology Office Note    Date:  02/11/2024 ID:  Duane Stevens, DOB 1945-10-11, MRN [id number] Cardiologist: Alvan Carrier, Duane Stevens { :  History of Present Illness:    Duane Stevens is a 78 y.o. male with past medical history of dyspnea on exertion, HTN, HLD, and lower extremity edema who presents to the office today for 1 month follow-up.  He was last examined by Jackee Alberts, NP in 12/2023 and had recently underwent a nuclear stress test at [facility] which had shown a small, moderate in severity reversible defect along the basal inferior and mid inferior septal segment which could be due to possible attenuation artifact but could not rule out ischemia and EF was normal at 65%. He denied any recent chest pain at the time of his appointment. He was continued on Zocor 20 mg daily and Losartan 25 mg daily with Toprol -XL 25 mg daily and PRN SL NTG being added to his medication regimen.  In talking with the patient and his wife today, he reports still having dyspnea on exertion which acutely worsened earlier this year.  Denies any associated orthopnea or PND. Has had lower extremity edema for over a year and prescribed Lasix  by his PCP. He denies any exertional chest pain or palpitations. Says that he is not as active as he used to be over the past few years. Activity is mostly limited by arthritic pain. Says he was loading wood in a truck earlier this week and his biggest limiting factor at that time was joint pain but also noted shortness of breath.  Studies Reviewed:   EKG: EKG is not ordered today.  Echocardiogram: 09/2019 IMPRESSIONS     1. Left ventricular ejection fraction, by estimation, is 60 to 65%. The  left ventricle has normal function. The left ventricle has no regional  wall motion abnormalities. Left ventricular diastolic parameters are  indeterminate.   2. Right ventricular systolic function is normal. The right ventricular  size is normal. There is normal pulmonary  artery systolic pressure.   3. The mitral valve is normal in structure. No evidence of mitral valve  regurgitation. No evidence of mitral stenosis.   4. The aortic valve is tricuspid. Aortic valve regurgitation is not  visualized. No aortic stenosis is present.   5. The inferior vena cava is normal in size with greater than 50%  respiratory variability, suggesting right atrial pressure of 3 mmHg.   Echocardiogram: 10/2022 Summary   1. The left ventricle is normal in size with normal wall thickness.    2. The left ventricular systolic function is normal, LVEF is visually  estimated at 65-70%.    3. The aortic valve is trileaflet with mildly thickened leaflets with normal  excursion.   4. The right ventricle is normal in size, with normal systolic function.   NST: 12/2023 Impressions:  - Abnormal myocardial perfusion study  - There is a small in size, moderate in severity, minimally reversible  defect involving the basal inferior and mid inferior segments. This is  consistent with possible attenuation artifact but cannot rule out  ischemia.  - Post stress: Global systolic function is normal. The ejection fraction  was greater than 65%.    Physical Exam:   VS:  BP 132/84   Pulse 66   Ht 5' 8 (1.727 m)   Wt 195 lb 12.8 oz (88.8 kg)   SpO2 96%   BMI 29.77 kg/m    Wt Readings from Last 3 Encounters:  02/11/24 195 lb  12.8 oz (88.8 kg)  12/31/23 194 lb (88 kg)  03/02/23 193 lb 3.2 oz (87.6 kg)     GEN: Well nourished, well developed male appearing in no acute distress NECK: No JVD; No carotid bruits CARDIAC: RRR, no murmurs, rubs, gallops RESPIRATORY:  Clear to auscultation without rales, wheezing or rhonchi  ABDOMEN: Appears non-distended. No obvious abdominal masses. EXTREMITIES: No clubbing or cyanosis. No pitting edema.  Distal pedal pulses are 2+ bilaterally.   Assessment and Plan:   1. Abnormal Stress Test/Dyspnea on Exertion - He reports having worsening dyspnea  on exertion since earlier this year and it is unclear if symptoms are due to a cardiac etiology or deconditioning as his activity has been more limited given arthritis. Recent NST in 12/2023 as outlined above showed possible ischemia vs. artifact. Reviewed options with the patient and his wife today and will plan to obtain a Coronary CTA for further ischemic evaluation. Will obtain an updated BMET today. Continue ASA 81 mg daily, Losartan 25 mg daily, Toprol -XL 25 mg daily and Simvastatin 20 mg daily. - If Coronary CTA is reassuring, he might benefit from PFT's given his prior tobacco use. By review of Care Everywhere, he did have a CT Chest in 07/2021 which showed several pulmonary nodules but no follow-up was needed if low-risk.  2. Essential hypertension - BP is at 132/84  during today's visit. Continue current medical therapy with Losartan 25 mg daily and Toprol -XL 25 mg daily.  3. Hyperlipidemia, unspecified hyperlipidemia type - Followed by PCP and we will request a copy most recent labs. Currently on Simvastatin 20 mg daily.   Signed, Laymon CHRISTELLA Qua, PA-C

## 2024-02-10 NOTE — Progress Notes (Unsigned)
 Cardiology Office Note    Date:  02/11/2024 ID:  [name], DOB 1945-10-11, MRN [id number] Cardiologist: Alvan Carrier, MD { :  History of Present Illness:    Duane Stevens is a 78 y.o. male with past medical history of dyspnea on exertion, HTN, HLD, and lower extremity edema who presents to the office today for 1 month follow-up.  He was last examined by Jackee Alberts, NP in 12/2023 and had recently underwent a nuclear stress test at [facility] which had shown a small, moderate in severity reversible defect along the basal inferior and mid inferior septal segment which could be due to possible attenuation artifact but could not rule out ischemia and EF was normal at 65%. He denied any recent chest pain at the time of his appointment. He was continued on Zocor 20 mg daily and Losartan 25 mg daily with Toprol -XL 25 mg daily and PRN SL NTG being added to his medication regimen.  In talking with the patient and his wife today, he reports still having dyspnea on exertion which acutely worsened earlier this year.  Denies any associated orthopnea or PND. Has had lower extremity edema for over a year and prescribed Lasix  by his PCP. He denies any exertional chest pain or palpitations. Says that he is not as active as he used to be over the past few years. Activity is mostly limited by arthritic pain. Says he was loading wood in a truck earlier this week and his biggest limiting factor at that time was joint pain but also noted shortness of breath.  Studies Reviewed:   EKG: EKG is not ordered today.  Echocardiogram: 09/2019 IMPRESSIONS     1. Left ventricular ejection fraction, by estimation, is 60 to 65%. The  left ventricle has normal function. The left ventricle has no regional  wall motion abnormalities. Left ventricular diastolic parameters are  indeterminate.   2. Right ventricular systolic function is normal. The right ventricular  size is normal. There is normal pulmonary  artery systolic pressure.   3. The mitral valve is normal in structure. No evidence of mitral valve  regurgitation. No evidence of mitral stenosis.   4. The aortic valve is tricuspid. Aortic valve regurgitation is not  visualized. No aortic stenosis is present.   5. The inferior vena cava is normal in size with greater than 50%  respiratory variability, suggesting right atrial pressure of 3 mmHg.   Echocardiogram: 10/2022 Summary   1. The left ventricle is normal in size with normal wall thickness.    2. The left ventricular systolic function is normal, LVEF is visually  estimated at 65-70%.    3. The aortic valve is trileaflet with mildly thickened leaflets with normal  excursion.   4. The right ventricle is normal in size, with normal systolic function.   NST: 12/2023 Impressions:  - Abnormal myocardial perfusion study  - There is a small in size, moderate in severity, minimally reversible  defect involving the basal inferior and mid inferior segments. This is  consistent with possible attenuation artifact but cannot rule out  ischemia.  - Post stress: Global systolic function is normal. The ejection fraction  was greater than 65%.    Physical Exam:   VS:  BP 132/84   Pulse 66   Ht 5' 8 (1.727 m)   Wt 195 lb 12.8 oz (88.8 kg)   SpO2 96%   BMI 29.77 kg/m    Wt Readings from Last 3 Encounters:  02/11/24 195 lb  12.8 oz (88.8 kg)  12/31/23 194 lb (88 kg)  03/02/23 193 lb 3.2 oz (87.6 kg)     GEN: Well nourished, well developed male appearing in no acute distress NECK: No JVD; No carotid bruits CARDIAC: RRR, no murmurs, rubs, gallops RESPIRATORY:  Clear to auscultation without rales, wheezing or rhonchi  ABDOMEN: Appears non-distended. No obvious abdominal masses. EXTREMITIES: No clubbing or cyanosis. No pitting edema.  Distal pedal pulses are 2+ bilaterally.   Assessment and Plan:   1. Abnormal Stress Test/Dyspnea on Exertion - He reports having worsening dyspnea  on exertion since earlier this year and it is unclear if symptoms are due to a cardiac etiology or deconditioning as his activity has been more limited given arthritis. Recent NST in 12/2023 as outlined above showed possible ischemia vs. artifact. Reviewed options with the patient and his wife today and will plan to obtain a Coronary CTA for further ischemic evaluation. Will obtain an updated BMET today. Continue ASA 81 mg daily, Losartan 25 mg daily, Toprol -XL 25 mg daily and Simvastatin 20 mg daily. - If Coronary CTA is reassuring, he might benefit from PFT's given his prior tobacco use. By review of Care Everywhere, he did have a CT Chest in 07/2021 which showed several pulmonary nodules but no follow-up was needed if low-risk.  2. Essential hypertension - BP is at 132/84  during today's visit. Continue current medical therapy with Losartan 25 mg daily and Toprol -XL 25 mg daily.  3. Hyperlipidemia, unspecified hyperlipidemia type - Followed by PCP and we will request a copy most recent labs. Currently on Simvastatin 20 mg daily.   Signed, Laymon CHRISTELLA Qua, PA-C

## 2024-02-11 ENCOUNTER — Ambulatory Visit: Payer: Self-pay | Admitting: Student

## 2024-02-11 ENCOUNTER — Encounter: Payer: Self-pay | Admitting: Student

## 2024-02-11 ENCOUNTER — Ambulatory Visit: Attending: Student | Admitting: Student

## 2024-02-11 ENCOUNTER — Other Ambulatory Visit (HOSPITAL_COMMUNITY)
Admission: RE | Admit: 2024-02-11 | Discharge: 2024-02-11 | Disposition: A | Discharge status: Home / Self Care | Source: Ambulatory Visit | Attending: Student | Admitting: Student

## 2024-02-11 VITALS — BP 132/84 | HR 66 | Ht 68.0 in | Wt 195.8 lb

## 2024-02-11 DIAGNOSIS — R0609 Other forms of dyspnea: Secondary | ICD-10-CM

## 2024-02-11 DIAGNOSIS — R9439 Abnormal result of other cardiovascular function study: Secondary | ICD-10-CM

## 2024-02-11 DIAGNOSIS — I10 Essential (primary) hypertension: Secondary | ICD-10-CM

## 2024-02-11 DIAGNOSIS — E785 Hyperlipidemia, unspecified: Secondary | ICD-10-CM

## 2024-02-11 LAB — BASIC METABOLIC PANEL WITH GFR
Anion gap: 11 (ref 5–15)
BUN: 13 mg/dL (ref 8–23)
CO2: 27 mmol/L (ref 22–32)
Calcium: 9.1 mg/dL (ref 8.9–10.3)
Chloride: 102 mmol/L (ref 98–111)
Creatinine, Ser: 0.81 mg/dL (ref 0.61–1.24)
GFR, Estimated: >60 mL/min (ref >60)
Glucose, Bld: 157 mg/dL — ABNORMAL HIGH (ref 70–99)
Potassium: 4.3 mmol/L (ref 3.5–5.1)
Sodium: 140 mmol/L (ref 135–145)

## 2024-02-11 MED ORDER — METOPROLOL TARTRATE 100 MG PO TABS: 100.0000 mg | ORAL_TABLET | ORAL | 0 refills | Status: DC

## 2024-02-11 NOTE — Patient Instructions (Signed)
 Medication Instructions:  Your physician recommends that you continue on your current medications as directed. Please refer to the Current Medication list given to you today.  Hold Toprol  XL and Lasix  the morning of your cardiac CT.  On the morning of your Cardiac CT scan Take Lopressor  100 mg two hours prior to Cardiac CT scan.   *If you need a refill on your cardiac medications before your next appointment, please call your pharmacy*  Lab Work: Please have this done at [facility]. (hours-Monday through Friday from 8:00 am to 4:00 pm except 11:30 am to 12:10 pm)  If you have labs (blood work) drawn today and your tests are completely normal, you will receive your results only by: MyChart Message (if you have MyChart) OR A paper copy in the mail If you have any lab test that is abnormal or we need to change your treatment, we will call you to review the results.  Testing/Procedures:   Your cardiac CT will be scheduled at one of the below locations:   [facility] [address] [place], [place] [address] [phone] (Severe contrast allergies only)  OR   [facility] [address] [place], [place] [address] [phone]  OR   [facility] [address] [place], [place] [address] [phone]  OR   [facility] [address]  [place], [place] [address]  If scheduled at [facility], please arrive at the [facility] and [facility] Entrance (Entrance C2) of [facility] 30 minutes prior to test start time. You can use the FREE valet parking offered at entrance C (encouraged to control the heart rate for the test)  Proceed to the [facility] Radiology Department (first floor) to check-in and test prep.  All radiology patients and guests should use entrance C2 at [facility], accessed from [facility], even though the hospital's physical address listed is [address].  If scheduled at the Heart and Vascular Tower at [address], please enter the parking lot using the [address] entrance and use the FREE valet service at the patient drop-off area. Enter the building and check-in with registration on the main floor.  If scheduled at [facility], please arrive to the Heart and Vascular Center 15 mins early for check-in and test prep.  There is spacious parking and easy access to the radiology department from the [facility] and Vascular entrance. Please enter here and check-in with the desk attendant.   If scheduled at [facility], please arrive 30 minutes early for check-in and test prep.  Please follow these instructions carefully (unless otherwise directed):  An IV will be required for this test and Nitroglycerin  will be given.  Hold all erectile dysfunction medications at least 3 days (72 hrs) prior to test. (Ie viagra, cialis, sildenafil, tadalafil, etc)   On the Night Before the Test: Be sure to Drink plenty of water. Do not consume any caffeinated/decaffeinated beverages or chocolate 12 hours prior to your test. Do not take any antihistamines 12 hours prior to your test.  On the Day of the Test: Drink plenty of water until 1 hour prior to the test. Do not eat any food 1 hour prior to test. You may take your regular medications prior to the test.  Take metoprolol  (Lopressor ) two hours prior to test. If you take Furosemide /Hydrochlorothiazide/Spironolactone/Chlorthalidone, please HOLD on the morning of the test.  Patients who wear a continuous glucose monitor MUST remove the device prior to scanning.     After the Test: Drink plenty of water. After receiving IV contrast, you may experience a mild flushed feeling. This is normal. On occasion, you may experience a mild rash up to 24 hours after the test. This is not dangerous. If this occurs, you can take Benadryl 25 mg, Zyrtec, Claritin, or  Allegra and increase your fluid intake. (Patients taking Tikosyn should avoid Benadryl, and may take Zyrtec, Claritin, or Allegra) If you experience trouble breathing, this can be serious. If it is severe call 911 IMMEDIATELY. If it is mild, please call our office.  We will call to schedule your test 2-4 weeks out understanding that some insurance companies will need an authorization prior to the service being performed.   For more information and frequently asked questions, please visit our website : [email or web address]  For non-scheduling related questions, please contact the cardiac imaging nurse navigator should you have any questions/concerns: Cardiac Imaging Nurse Navigators Direct Office Dial: [phone]   For scheduling needs, including cancellations and rescheduling, please call [place], [phone].   Follow-Up: At [facility], you and your health needs are our priority.  As part of our continuing mission to provide you with exceptional heart care, our providers are all part of one team.  This team includes your primary Cardiologist (physician) and Advanced Practice Providers or APPs (Physician Assistants and Nurse Practitioners) who all work together to provide you with the care you need, when you need it.  Your next appointment:   2-3 month(s)  Provider:   You may see Alvan Carrier, MD or one of the following Advanced Practice Providers on your designated Care Team:   Laymon Qua, PA-C  [place], [place] Olivia [place], [place]     We recommend signing up for the patient portal called MyChart.  Sign up information is provided on this After Visit Summary.  MyChart is used to connect with patients for Virtual Visits (Telemedicine).  Patients are able to view lab/test results, encounter notes, upcoming appointments, etc.  Non-urgent messages can be sent to your provider as well.   To learn more about what you can do with MyChart, go to  [email or web address].   Other Instructions Thank you for choosing [facility]!

## 2024-02-17 ENCOUNTER — Other Ambulatory Visit: Payer: Self-pay | Admitting: Endocrinology

## 2024-02-17 ENCOUNTER — Other Ambulatory Visit: Payer: Self-pay | Admitting: Nurse Practitioner

## 2024-02-17 DIAGNOSIS — E890 Postprocedural hypothyroidism: Secondary | ICD-10-CM

## 2024-02-22 ENCOUNTER — Ambulatory Visit: Admitting: Nurse Practitioner

## 2024-02-26 ENCOUNTER — Encounter (HOSPITAL_COMMUNITY): Payer: Self-pay

## 2024-02-29 ENCOUNTER — Telehealth (HOSPITAL_COMMUNITY): Payer: Self-pay | Admitting: Emergency Medicine

## 2024-02-29 NOTE — Telephone Encounter (Signed)
 Attempted to call patient regarding upcoming cardiac CT appointment. Left message on voicemail with name and callback number Rockwell Alexandria RN Navigator Cardiac Imaging [facility] [phone] Office [phone] Cell

## 2024-02-29 NOTE — Telephone Encounter (Signed)
 Reaching out to patient to offer assistance regarding upcoming cardiac imaging study; pt verbalizes understanding of appt date/time, parking situation and where to check in, pre-test NPO status and medications ordered, and verified current allergies; name and call back number provided for further questions should they arise Duane Shutter RN Navigator Cardiac Imaging [facility] [phone] office [phone] cell  Holding daily toprol  xl and lasix , taking 100mg  metoprolol 

## 2024-03-01 ENCOUNTER — Ambulatory Visit (HOSPITAL_COMMUNITY)
Admission: RE | Admit: 2024-03-01 | Discharge: 2024-03-01 | Disposition: A | Discharge status: Home / Self Care | Source: Ambulatory Visit | Attending: Cardiovascular Disease | Admitting: Cardiovascular Disease

## 2024-03-01 DIAGNOSIS — R9439 Abnormal result of other cardiovascular function study: Secondary | ICD-10-CM | POA: Insufficient documentation

## 2024-03-01 DIAGNOSIS — I2510 Atherosclerotic heart disease of native coronary artery without angina pectoris: Secondary | ICD-10-CM | POA: Insufficient documentation

## 2024-03-01 DIAGNOSIS — R0609 Other forms of dyspnea: Secondary | ICD-10-CM | POA: Insufficient documentation

## 2024-03-01 MED ORDER — IOHEXOL 350 MG/ML SOLN
100.0000 mL | Freq: Once | INTRAVENOUS | Status: AC | PRN
  Administered 2024-03-01: 100 mL via INTRAVENOUS

## 2024-03-01 MED ORDER — NITROGLYCERIN 0.4 MG SL SUBL
0.8000 mg | SUBLINGUAL_TABLET | Freq: Once | SUBLINGUAL | Status: AC
  Administered 2024-03-01: 0.8 mg via SUBLINGUAL

## 2024-03-02 ENCOUNTER — Other Ambulatory Visit: Payer: Self-pay | Admitting: Cardiology

## 2024-03-02 ENCOUNTER — Ambulatory Visit (HOSPITAL_COMMUNITY)
Admission: RE | Admit: 2024-03-02 | Discharge: 2024-03-02 | Disposition: A | Discharge status: Home / Self Care | Source: Ambulatory Visit | Attending: Cardiology | Admitting: Cardiology

## 2024-03-02 DIAGNOSIS — R931 Abnormal findings on diagnostic imaging of heart and coronary circulation: Secondary | ICD-10-CM | POA: Insufficient documentation

## 2024-03-02 DIAGNOSIS — I2510 Atherosclerotic heart disease of native coronary artery without angina pectoris: Secondary | ICD-10-CM

## 2024-03-03 ENCOUNTER — Telehealth: Payer: Self-pay | Admitting: Student

## 2024-03-03 ENCOUNTER — Encounter: Payer: Self-pay | Admitting: *Deleted

## 2024-03-03 ENCOUNTER — Ambulatory Visit: Payer: Self-pay | Admitting: Student

## 2024-03-03 DIAGNOSIS — Z01818 Encounter for other preprocedural examination: Secondary | ICD-10-CM

## 2024-03-03 NOTE — Telephone Encounter (Signed)
    The patient's Coronary CT showed a coronary calcium score of 2093 and he had moderate atherosclerosis with 50 to 69% stenosis along the proximal to mid LAD. FFR showed possible hemodynamically flow-limiting lesion along the proximal to mid LAD and cardiac catheterization was recommended for definitive evaluation by Dr. Shlomo.  I called the patient and his wife and reviewed these results. Reviewed recommendations for a cardiac catheterization including the procedure, risks and benefits. He is in agreement to proceed. Scheduled for 03/09/2024 with Dr. Jordan and procedure time is at 1000 (arrival time at 0800).   Given his BMET would almost be a month out at that time, would obtain an updated CBC, BMET and FLP. He should hold Metformin 24 hours prior to the procedure and hold Lasix  and Glipizide the morning of his catheterization.  Informed Consent   Shared Decision Making/Informed Consent{ The risks [stroke (1 in 1000), death (1 in 1000), kidney failure [usually temporary] (1 in 500), bleeding (1 in 200), allergic reaction [possibly serious] (1 in 200)], benefits (diagnostic support and management of coronary artery disease) and alternatives of a cardiac catheterization were discussed in detail with Mr. Gandolfi and he is willing to proceed.     Iva - He needs a pre-procedure letter with the above information and lab slips for [facility] (BMET, CBC and FLP).   Signed, Laymon CHRISTELLA Qua, PA-C 03/03/2024, 12:21 PM Pager: [phone]

## 2024-03-03 NOTE — Telephone Encounter (Signed)
 Cath letter printed and lab orders placed.

## 2024-03-05 LAB — LIPID PANEL
Chol/HDL Ratio: 2.7 ratio (ref 0.0–5.0)
Cholesterol, Total: 151 mg/dL (ref 100–199)
HDL: 56 mg/dL (ref >39)
LDL Chol Calc (NIH): 80 mg/dL (ref 0–99)
Triglycerides: 76 mg/dL (ref 0–149)
VLDL Cholesterol Cal: 15 mg/dL (ref 5–40)

## 2024-03-05 LAB — BASIC METABOLIC PANEL WITH GFR
BUN/Creatinine Ratio: 13 (ref 10–24)
BUN: 11 mg/dL (ref 8–27)
CO2: 28 mmol/L (ref 20–29)
Calcium: 9.4 mg/dL (ref 8.6–10.2)
Chloride: 102 mmol/L (ref 96–106)
Creatinine, Ser: 0.85 mg/dL (ref 0.76–1.27)
Glucose: 111 mg/dL — ABNORMAL HIGH (ref 70–99)
Potassium: 4.7 mmol/L (ref 3.5–5.2)
Sodium: 141 mmol/L (ref 134–144)
eGFR: 88 mL/min/1.73 (ref >59)

## 2024-03-05 LAB — CBC
Hematocrit: 43.9 % (ref 37.5–51.0)
Hemoglobin: 15 g/dL (ref 13.0–17.7)
MCH: 29.9 pg (ref 26.6–33.0)
MCHC: 34.2 g/dL (ref 31.5–35.7)
MCV: 88 fL (ref 79–97)
Platelets: 243 x10E3/uL (ref 150–450)
RBC: 5.01 x10E6/uL (ref 4.14–5.80)
RDW: 13 % (ref 11.6–15.4)
WBC: 7.4 x10E3/uL (ref 3.4–10.8)

## 2024-03-06 ENCOUNTER — Ambulatory Visit: Payer: Self-pay | Admitting: Student

## 2024-03-06 DIAGNOSIS — E785 Hyperlipidemia, unspecified: Secondary | ICD-10-CM

## 2024-03-06 DIAGNOSIS — E7800 Pure hypercholesterolemia, unspecified: Secondary | ICD-10-CM

## 2024-03-07 MED ORDER — ROSUVASTATIN CALCIUM 20 MG PO TABS: 20.0000 mg | ORAL_TABLET | Freq: Every day | ORAL | 3 refills | Status: AC

## 2024-03-08 ENCOUNTER — Telehealth: Payer: Self-pay | Admitting: *Deleted

## 2024-03-08 NOTE — Telephone Encounter (Signed)
 Cardiac Catheterization scheduled at [facility] for: Wednesday March 09, 2024 10 AM Arrival time [facility] Main Entrance A at: 8 AM  Diet: -Nothing to eat after midnight.  Hydration: -May drink clear liquids until 2 hours before the procedure.  Approved liquids: Water, clear tea, black coffee, fruit juices-non-citric and without pulp,Gatorade, plain Jello/popsicles.   -Please drink 16 oz of water 2 hours before procedure.  Medication instructions: -Hold:  Metformin-day of procedure and 48 hours after procedure   Glipizide/Lasix -AM of procedure -Other usual morning medications can be taken including aspirin 81 mg.  Plan to go home the same day, you will only stay overnight if medically necessary.  You must have responsible adult to drive you home.  Someone must be with you the first 24 hours after you arrive home.  Reviewed procedure instructions with patient's wife (DPR), Avelina.

## 2024-03-09 ENCOUNTER — Other Ambulatory Visit: Payer: Self-pay

## 2024-03-09 ENCOUNTER — Other Ambulatory Visit (HOSPITAL_COMMUNITY): Payer: Self-pay

## 2024-03-09 ENCOUNTER — Ambulatory Visit (HOSPITAL_COMMUNITY)
Admission: RE | Admit: 2024-03-09 | Discharge: 2024-03-09 | Disposition: A | Discharge status: Home / Self Care | Attending: Cardiology | Admitting: Cardiology

## 2024-03-09 ENCOUNTER — Encounter (HOSPITAL_COMMUNITY)
Admission: RE | Disposition: A | Discharge status: Home / Self Care | Payer: Self-pay | Source: Home / Self Care | Attending: Cardiology

## 2024-03-09 DIAGNOSIS — E785 Hyperlipidemia, unspecified: Secondary | ICD-10-CM | POA: Insufficient documentation

## 2024-03-09 DIAGNOSIS — I25119 Atherosclerotic heart disease of native coronary artery with unspecified angina pectoris: Secondary | ICD-10-CM | POA: Diagnosis present

## 2024-03-09 DIAGNOSIS — Z79899 Other long term (current) drug therapy: Secondary | ICD-10-CM | POA: Insufficient documentation

## 2024-03-09 DIAGNOSIS — Z7902 Long term (current) use of antithrombotics/antiplatelets: Secondary | ICD-10-CM | POA: Diagnosis not present

## 2024-03-09 DIAGNOSIS — I2510 Atherosclerotic heart disease of native coronary artery without angina pectoris: Secondary | ICD-10-CM

## 2024-03-09 DIAGNOSIS — R600 Localized edema: Secondary | ICD-10-CM | POA: Insufficient documentation

## 2024-03-09 DIAGNOSIS — I10 Essential (primary) hypertension: Secondary | ICD-10-CM | POA: Insufficient documentation

## 2024-03-09 DIAGNOSIS — Z7982 Long term (current) use of aspirin: Secondary | ICD-10-CM | POA: Insufficient documentation

## 2024-03-09 DIAGNOSIS — Z955 Presence of coronary angioplasty implant and graft: Secondary | ICD-10-CM

## 2024-03-09 HISTORY — PX: CORONARY STENT INTERVENTION: CATH118234

## 2024-03-09 HISTORY — PX: CORONARY PRESSURE/FFR WITH 3D MAPPING: CATH118309

## 2024-03-09 HISTORY — PX: LEFT HEART CATH AND CORONARY ANGIOGRAPHY: CATH118249

## 2024-03-09 LAB — POCT ACTIVATED CLOTTING TIME
Activated Clotting Time: 251 s
Activated Clotting Time: 302 s

## 2024-03-09 LAB — GLUCOSE, CAPILLARY
Glucose-Capillary: 163 mg/dL — ABNORMAL HIGH (ref 70–99)
Glucose-Capillary: 169 mg/dL — ABNORMAL HIGH (ref 70–99)

## 2024-03-09 SURGERY — LEFT HEART CATH AND CORONARY ANGIOGRAPHY
Anesthesia: LOCAL

## 2024-03-09 MED ORDER — FREE WATER: 500.0000 mL | Freq: Once | Status: DC

## 2024-03-09 MED ORDER — SODIUM CHLORIDE 0.9% FLUSH: 3.0000 mL | Freq: Two times a day (BID) | INTRAVENOUS | Status: DC

## 2024-03-09 MED ORDER — VERAPAMIL HCL 2.5 MG/ML IV SOLN
INTRAVENOUS | Status: AC
  Filled 2024-03-09: qty 2

## 2024-03-09 MED ORDER — MIDAZOLAM HCL (PF) 2 MG/2ML IJ SOLN
INTRAMUSCULAR | Status: DC | PRN
Start: 2024-03-09 — End: 2024-03-09
  Administered 2024-03-09 (×2): 1 mg via INTRAVENOUS

## 2024-03-09 MED ORDER — HEPARIN (PORCINE) IN NACL 2-0.9 UNITS/ML
INTRAMUSCULAR | Status: DC | PRN
  Administered 2024-03-09: 10 mL via INTRA_ARTERIAL

## 2024-03-09 MED ORDER — CLOPIDOGREL BISULFATE 75 MG PO TABS
75.0000 mg | ORAL_TABLET | Freq: Every day | ORAL | 3 refills | Status: AC
  Filled 2024-03-09 – 2024-05-24 (×2): qty 90, 90d supply, fill #0
  Filled 2024-05-24: qty 90, 90d supply, fill #1

## 2024-03-09 MED ORDER — MIDAZOLAM HCL 2 MG/2ML IJ SOLN
INTRAMUSCULAR | Status: AC
Start: 2024-03-09 — End: 2024-03-09
  Filled 2024-03-09: qty 2

## 2024-03-09 MED ORDER — HYDRALAZINE HCL 20 MG/ML IJ SOLN: 10.0000 mg | INTRAMUSCULAR | Status: AC | PRN

## 2024-03-09 MED ORDER — HEPARIN SODIUM (PORCINE) 1000 UNIT/ML IJ SOLN
INTRAMUSCULAR | Status: AC
  Filled 2024-03-09: qty 10

## 2024-03-09 MED ORDER — VITAMIN D 25 MCG (1000 UNIT) PO TABS
1000.0000 [IU] | ORAL_TABLET | Freq: Every evening | ORAL | Status: DC
  Filled 2024-03-09: qty 1

## 2024-03-09 MED ORDER — ACETAMINOPHEN 325 MG PO TABS: 650.0000 mg | ORAL_TABLET | ORAL | Status: DC | PRN

## 2024-03-09 MED ORDER — HEPARIN SODIUM (PORCINE) 1000 UNIT/ML IJ SOLN
INTRAMUSCULAR | Status: DC | PRN
  Administered 2024-03-09: 4500 [IU] via INTRAVENOUS
  Administered 2024-03-09: 3000 [IU] via INTRAVENOUS
  Administered 2024-03-09: 4500 [IU] via INTRAVENOUS

## 2024-03-09 MED ORDER — HEPARIN SODIUM (PORCINE) 1000 UNIT/ML IJ SOLN
INTRAMUSCULAR | Status: AC
Start: 2024-03-09 — End: 2024-03-09
  Filled 2024-03-09: qty 10

## 2024-03-09 MED ORDER — SODIUM CHLORIDE 0.9% FLUSH: 3.0000 mL | INTRAVENOUS | Status: DC | PRN

## 2024-03-09 MED ORDER — FREE WATER
500.0000 mL | Freq: Once | Status: AC
  Administered 2024-03-09: 500 mL via ORAL

## 2024-03-09 MED ORDER — FENTANYL CITRATE (PF) 100 MCG/2ML IJ SOLN
INTRAMUSCULAR | Status: AC
  Filled 2024-03-09: qty 2

## 2024-03-09 MED ORDER — METOPROLOL SUCCINATE ER 25 MG PO TB24
25.0000 mg | ORAL_TABLET | Freq: Every day | ORAL | Status: DC
  Filled 2024-03-09: qty 1

## 2024-03-09 MED ORDER — NITROGLYCERIN 1 MG/10 ML FOR IR/CATH LAB
INTRA_ARTERIAL | Status: AC
  Filled 2024-03-09: qty 10

## 2024-03-09 MED ORDER — NITROGLYCERIN 0.4 MG SL SUBL: 0.4000 mg | SUBLINGUAL_TABLET | SUBLINGUAL | Status: DC | PRN

## 2024-03-09 MED ORDER — ASPIRIN 81 MG PO TBEC
81.0000 mg | DELAYED_RELEASE_TABLET | Freq: Every evening | ORAL | Status: DC
  Filled 2024-03-09: qty 1

## 2024-03-09 MED ORDER — ASPIRIN 81 MG PO CHEW: 81.0000 mg | CHEWABLE_TABLET | ORAL | Status: DC

## 2024-03-09 MED ORDER — LIDOCAINE HCL (PF) 1 % IJ SOLN
INTRAMUSCULAR | Status: DC | PRN
  Administered 2024-03-09: 2 mL

## 2024-03-09 MED ORDER — ONDANSETRON HCL 4 MG/2ML IJ SOLN
4.0000 mg | Freq: Four times a day (QID) | INTRAMUSCULAR | Status: DC | PRN
Start: 2024-03-09 — End: 2024-03-09

## 2024-03-09 MED ORDER — LIDOCAINE HCL (PF) 1 % IJ SOLN
INTRAMUSCULAR | Status: AC
Start: 2024-03-09 — End: 2024-03-09
  Filled 2024-03-09: qty 30

## 2024-03-09 MED ORDER — LABETALOL HCL 5 MG/ML IV SOLN: 10.0000 mg | INTRAVENOUS | Status: AC | PRN

## 2024-03-09 MED ORDER — IOHEXOL 350 MG/ML SOLN
INTRAVENOUS | Status: DC | PRN
  Administered 2024-03-09: 120 mL

## 2024-03-09 MED ORDER — CLOPIDOGREL BISULFATE 75 MG PO TABS: 75.0000 mg | ORAL_TABLET | Freq: Every day | ORAL | Status: DC

## 2024-03-09 MED ORDER — NITROGLYCERIN 1 MG/10 ML FOR IR/CATH LAB
INTRA_ARTERIAL | Status: DC | PRN
  Administered 2024-03-09 (×2): 200 ug

## 2024-03-09 MED ORDER — METFORMIN HCL 500 MG PO TABS: 1000.0000 mg | ORAL_TABLET | Freq: Two times a day (BID) | ORAL | Status: AC

## 2024-03-09 MED ORDER — LOSARTAN POTASSIUM 25 MG PO TABS
25.0000 mg | ORAL_TABLET | Freq: Every day | ORAL | Status: DC
  Filled 2024-03-09: qty 1

## 2024-03-09 MED ORDER — HEPARIN (PORCINE) IN NACL 1000-0.9 UT/500ML-% IV SOLN
INTRAVENOUS | Status: DC | PRN
  Administered 2024-03-09 (×2): 500 mL

## 2024-03-09 MED ORDER — SODIUM CHLORIDE 0.9 % IV SOLN: 250.0000 mL | INTRAVENOUS | Status: DC | PRN

## 2024-03-09 MED ORDER — SODIUM CHLORIDE 0.9 % IV SOLN
250.0000 mL | INTRAVENOUS | Status: DC | PRN
Start: 2024-03-09 — End: 2024-03-09

## 2024-03-09 MED ORDER — CLOPIDOGREL BISULFATE 300 MG PO TABS
ORAL_TABLET | ORAL | Status: AC
  Filled 2024-03-09: qty 2

## 2024-03-09 MED ORDER — FENTANYL CITRATE (PF) 100 MCG/2ML IJ SOLN
INTRAMUSCULAR | Status: DC | PRN
  Administered 2024-03-09 (×2): 25 ug via INTRAVENOUS

## 2024-03-09 MED ORDER — LEVOTHYROXINE SODIUM 88 MCG PO TABS
88.0000 ug | ORAL_TABLET | Freq: Every day | ORAL | Status: DC
  Filled 2024-03-09: qty 1

## 2024-03-09 MED ORDER — ROSUVASTATIN CALCIUM 20 MG PO TABS
20.0000 mg | ORAL_TABLET | Freq: Every day | ORAL | Status: DC
  Filled 2024-03-09: qty 1

## 2024-03-09 MED ORDER — GLIPIZIDE ER 2.5 MG PO TB24
2.5000 mg | ORAL_TABLET | Freq: Every day | ORAL | Status: DC
  Filled 2024-03-09: qty 1

## 2024-03-09 MED ORDER — CLOPIDOGREL BISULFATE 300 MG PO TABS
ORAL_TABLET | ORAL | Status: DC | PRN
  Administered 2024-03-09: 600 mg via ORAL

## 2024-03-09 SURGICAL SUPPLY — 18 items
BALLOON EMERGE MR 2.5X12 (BALLOONS) IMPLANT
BALLOON SAPPHIRE NC24 3.0X10 (BALLOONS) IMPLANT
BALLOON WOLVERINE 2.50X10 (BALLOONS) IMPLANT
CARD KEY FFR CATHWORX (MISCELLANEOUS) IMPLANT
CATH GUIDELINER COAST (CATHETERS) IMPLANT
CATH INFINITI 5 FR JL3.5 (CATHETERS) IMPLANT
CATH INFINITI JR4 5F (CATHETERS) IMPLANT
CATH VISTA GUIDE 6FR XBLD 3.5 (CATHETERS) IMPLANT
DEVICE RAD COMP TR BAND LRG (VASCULAR PRODUCTS) IMPLANT
GLIDESHEATH SLEND SS 6F .021 (SHEATH) IMPLANT
GUIDEWIRE INQWIRE 1.5J.035X260 (WIRE) IMPLANT
KIT ENCORE 26 ADVANTAGE (KITS) IMPLANT
PACK CARDIAC CATHETERIZATION (CUSTOM PROCEDURE TRAY) ×1 IMPLANT
SET ATX-X65L (MISCELLANEOUS) IMPLANT
SHEATH PROBE COVER 6X72 (BAG) IMPLANT
STENT SYNERGY XD 2.75X20 (Permanent Stent) IMPLANT
TUBING CIL FLEX 10 FLL-RA (TUBING) IMPLANT
WIRE ASAHI PROWATER 180CM (WIRE) IMPLANT

## 2024-03-09 NOTE — Progress Notes (Signed)
 Patient and patient wife given discharge instructions per RN Andrea no further questions at this time. Patient able to ambulate and void before discharge. Able to tolerate PO intake. Patient site is clean, dry, intact with no hematoma noted upon discharge.

## 2024-03-09 NOTE — Progress Notes (Signed)
 Discussed with pt and wife stent, restrictions, Plavix  importance,  diet,  and CRPII. Pt receptive. Will refer to [facility] CRPII. Pt has severe arthritis in his knees currently and planning to get a steroid shot soon. Encouraged activity/exercise as tolerated.  [id number] Duane Stevens BS, ACSM-CEP 03/09/2024 2:38 PM

## 2024-03-09 NOTE — Discharge Summary (Signed)
 Discharge Summary for Same Day PCI   Patient ID: Duane Stevens MRN: [id number]; DOB: [birth year]  Admit date: 03/09/2024 Discharge date: 03/09/2024  Primary Care Provider: Lari Elspeth BRAVO, MD  Primary Cardiologist: Alvan Carrier, MD  Primary Electrophysiologist:  None   Discharge Diagnoses    Principal Problem:   Coronary artery disease involving native coronary artery of native heart with angina pectoris    Diagnostic Studies/Procedures    Cardiac Catheterization 03/09/2024:   Mid LAD lesion is 75% stenosed.   Mid RCA lesion is 40% stenosed.   A drug-eluting stent was successfully placed using a STENT SYNERGY XD 2.75X20.   Post intervention, there is a 0% residual stenosis.   LV end diastolic pressure is normal.   Recommend uninterrupted dual antiplatelet therapy with Aspirin  81mg  daily and Clopidogrel  75mg  daily for a minimum of 6 months (stable ischemic heart disease-Class I recommendation).   Single vessel obstructive CAD involving the mid LAD Normal LVEDP Successful PCI of the mid LAD with cutting balloon and DES x 1   Plan: DAPT for 6 months. Anticipate same day DC Diagnostic Dominance: Left  Intervention   _____________   History of Present Illness     Duane Stevens is a 78 y.o. male with a past medical history of HTN, HLD, lower extremity swelling, dyspnea on exertion. Patient had been seen in 12/2023 and underwent a nuclear stress test at [facility] that showed a small, moderate in severity reversible defect along the basal inferior and mid inferior septal segment. EF was normal at 65%. Managed medically with Zocor 20 mg daily, Losartan  25 mg daily, and Toprol -XL 25 mg daily. PRN SL NTG was added to his medication regiment. Seen again in clinic on 10/23.25. At that time, patient continued to have dyspnea on exertion. Was not having chest pain. Underwent  coronary CTA on 03/02/24 that demonstrated a plossible hemodynamically flow limiting lesion in the  prox-mid LAD.   Cardiac catheterization was arranged for further evaluation.  Hospital Course     The patient underwent cardiac cath as noted above with Dr. Jordan. Underwent successful PCI with cutting balloon and DES x1 to the mid LAD. Plan for DAPT with ASA/Plavix  for at least 6 months. The patient was seen by cardiac rehab while in short stay. There were no observed complications post cath. Radial cath site was re-evaluated prior to discharge and found to be stable without any complications. Instructions/precautions regarding cath site care were given prior to discharge.  Duane Stevens was seen by Dr. Jordan and determined stable for discharge home. Follow up with our office has been arranged. Medications are listed below. Pertinent changes include addition of plavix .  Patient has follow up with Dr. Alvan on 03/25/24.     _____________  Cath/PCI Registry Performance & Quality Measures: Aspirin  prescribed? - Yes ADP Receptor Inhibitor (Plavix /Clopidogrel , Brilinta/Ticagrelor or Effient/Prasugrel) prescribed (includes medically managed patients)? - Yes High Intensity Statin (Lipitor 40-80mg  or Crestor  20-40mg ) prescribed? - Yes For EF <40%, was ACEI/ARB prescribed? - Not Applicable (EF >/= 40%) For EF <40%, Aldosterone Antagonist (Spironolactone or Eplerenone) prescribed? - Not Applicable (EF >/= 40%) Cardiac Rehab Phase II ordered (Included Medically managed Patients)? - Yes  _____________   Discharge Vitals Blood pressure (!) 154/94, pulse 81, temperature 97.9 F (36.6 C), temperature source Oral, resp. rate 18, height 5' 8 (1.727 m), weight 90.7 kg, SpO2 97%.  Filed Weights   03/09/24 0843  Weight: 90.7 kg    Last Labs & Radiologic Studies  CBC No results for input(s): WBC, NEUTROABS, HGB, HCT, MCV, PLT in the last 72 hours. Basic Metabolic Panel No results for input(s): NA, K, CL, CO2, GLUCOSE, BUN, CREATININE, CALCIUM, MG, PHOS in the  last 72 hours. Liver Function Tests No results for input(s): AST, ALT, ALKPHOS, BILITOT, PROT, ALBUMIN in the last 72 hours. No results for input(s): LIPASE, AMYLASE in the last 72 hours. High Sensitivity Troponin:   No results for input(s): TROPONINIHS in the last 720 hours.  BNP Invalid input(s): POCBNP D-Dimer No results for input(s): DDIMER in the last 72 hours. Hemoglobin A1C No results for input(s): HGBA1C in the last 72 hours. Fasting Lipid Panel No results for input(s): CHOL, HDL, LDLCALC, TRIG, CHOLHDL, LDLDIRECT in the last 72 hours. Thyroid  Function Tests No results for input(s): TSH, T4TOTAL, T3FREE, THYROIDAB in the last 72 hours.  Invalid input(s): FREET3 _____________  CARDIAC CATHETERIZATION Result Date: 03/09/2024   Mid LAD lesion is 75% stenosed.   Mid RCA lesion is 40% stenosed.   A drug-eluting stent was successfully placed using a STENT SYNERGY XD 2.75X20.   Post intervention, there is a 0% residual stenosis.   LV end diastolic pressure is normal.   Recommend uninterrupted dual antiplatelet therapy with Aspirin 81mg  daily and Clopidogrel 75mg  daily for a minimum of 6 months (stable ischemic heart disease-Class I recommendation). Single vessel obstructive CAD involving the mid LAD Normal LVEDP Successful PCI of the mid LAD with cutting balloon and DES x 1 Plan: DAPT for 6 months. Anticipate same day DC   CT CORONARY MORPH W/CTA COR W/SCORE W/CA W/CM &/OR WO/CM Addendum Date: 03/05/2024 ADDENDUM REPORT: 03/05/2024 16:18 EXAM: OVER-READ INTERPRETATION  CT CHEST The following report is an over-read performed by radiologist Dr. Andrea Gasman of [facility] on 03/05/2024. This over-read does not include interpretation of cardiac or coronary anatomy or pathology. The coronary CTA interpretation by the cardiologist is attached. COMPARISON:  Report from chest CT 08/10/2021, images unavailable. FINDINGS: Vascular: Aortic  atherosclerosis. The included aorta is normal in caliber. Mediastinum/nodes: No adenopathy or mass. Unremarkable esophagus. Lungs: Subsegmental atelectasis/scarring in the lower lobes. The previously described pulmonary nodules are not definitively seen on the current exam. Minimal subpleural scarring in the anterior right middle lobe. No pleural fluid. The included airways are patent. Upper abdomen: No acute or unexpected findings. Musculoskeletal: There are no acute or suspicious osseous abnormalities. IMPRESSION: Aortic Atherosclerosis (ICD10-I70.0). Electronically Signed   By: Andrea Gasman M.D.   On: 03/05/2024 16:18   Result Date: 03/05/2024 CLINICAL DATA:  Chest pain EXAM: Cardiac/Coronary CTA TECHNIQUE: A non-contrast, gated CT scan was obtained with axial slices of 3 mm through the heart for calcium scoring. Calcium scoring was performed using the Agatston method. A 120 kV prospective, gated, contrast cardiac scan was obtained. Gantry rotation speed was 250 msecs and collimation was 0.6 mm. Two sublingual nitroglycerin  tablets (0.8 mg) were given. The 3D data set was reconstructed in 5% intervals of the 35-75% of the R-R cycle. Diastolic phases were analyzed on a dedicated workstation using MPR, MIP, and VRT modes. The patient received 95 cc of contrast. FINDINGS: Image quality: Fair.  Blooming artifact. Noise artifact is: Mild. Coronary Arteries:  Normal coronary origin.  Left dominance. Left main: The left main is a large caliber vessel with a normal take off from the left coronary cusp that bifurcates to form a left anterior descending artery and a left circumflex artery. There is no plaque or stenosis. Left anterior descending artery: The LAD  gives off 4 patent diagonal branches. The proximal and mid LAD are diffusely diseased with mild calcified plaque in the prox LAD (25-49%) and moderate in the prox to mid LAD (50-69%). Left circumflex artery: The LCX is dominant and gives off 3 patent obtuse  marginal branches and an LPDA. There is mild mixed plaque in the proximal LCx (25-49%). There is mild calcified plaque in the mid and distal LCx and LPDA (25-49%). There is mild calcified plaque in the prox OM1/OM2 (25-49%) and mild soft plaque in the ostial OM2 (25-49%). Right coronary artery: The RCA is non-dominant with normal take off from the right coronary cusp. There is mild to moderate calcified plaque in the ostial/prox and mid RCA (25-49% but >50% in some areas). Right Atrium: Right atrial size is within normal limits. Right Ventricle: The right ventricular cavity is within normal limits. Left Atrium: Left atrial size is normal in size with no left atrial appendage filling defect. Left Ventricle: The ventricular cavity size is within normal limits. Pulmonary arteries: Normal in size. Pulmonary veins: Normal pulmonary venous drainage. Pericardium: Normal thickness without significant effusion or calcium present. Cardiac valves: The aortic valve is trileaflet without significant calcification. The mitral valve is normal without significant calcification. Aorta: Normal caliber without significant disease. Extra-cardiac findings: See attached radiology report for non-cardiac structures. IMPRESSION: 1. Coronary calcium score of 2093. This was 84th percentile for age-, sex, and race-matched controls. 2. Normal coronary origin with left dominance. 3. Moderate atherosclerosis: 50-69% prox to mid LAD and RCA; 25-49% prox/mid/distal LCx/LPDA/OM1/OM2. 4. This study has been submitted for FFR analysis. RECOMMENDATIONS: 1. CAD-RADS 0: No evidence of CAD (0%). Consider non-atherosclerotic causes of chest pain. 2. CAD-RADS 1: Minimal non-obstructive CAD (0-24%). Consider non-atherosclerotic causes of chest pain. Consider preventive therapy and risk factor modification. 3. CAD-RADS 2: Mild non-obstructive CAD (25-49%). Consider non-atherosclerotic causes of chest pain. Consider preventive therapy and risk factor  modification. 4. CAD-RADS 3: Moderate stenosis. Consider symptom-guided anti-ischemic pharmacotherapy as well as risk factor modification per guideline directed care. Additional analysis with CT FFR will be submitted. 5. CAD-RADS 4: Severe stenosis. (70-99% or > 50% left main). Cardiac catheterization or CT FFR is recommended. Consider symptom-guided anti-ischemic pharmacotherapy as well as risk factor modification per guideline directed care. Invasive coronary angiography recommended with revascularization per published guideline statements. 6. CAD-RADS 5: Total coronary occlusion (100%). Consider cardiac catheterization or viability assessment. Consider symptom-guided anti-ischemic pharmacotherapy as well as risk factor modification per guideline directed care. 7. CAD-RADS N: Non-diagnostic study. Obstructive CAD can't be excluded. Alternative evaluation is recommended. Wilbert Bihari, MD Electronically Signed: By: Wilbert Bihari M.D. On: 03/02/2024 17:24   CT CORONARY FRACTIONAL FLOW RESERVE FLUID ANALYSIS Result Date: 03/02/2024 EXAM: FFRCT ANALYSIS FINDINGS: FFRct analysis was performed on the original cardiac CT angiogram dataset. Diagrammatic representation of the FFRct analysis is provided in a separate PDF document in PACS. This dictation was created using the PDF document and an interactive 3D model of the results. 3D model is not available in the EMR/PACS. Normal FFR range is >0.80. 1. Left Main: 1.00. 2. LAD: Possible significant stenosis: 0.98, 0.83, 0.71. 3. LCX: No significant stenosis: 0.98, 0.94, 0.92. PDA: 0.80. 4. RCA: Non dominant: 0.98., Mid RCA not modeled in non dominant vessel. IMPRESSION: 1. Coronary CTA FFR flow analysis demonstrates possible hemodynamically flow limiting lesion in the prox to mid LAD (FFR 0.98>0.83>0.71). 2. Recommend cardiac catheterization. Wilbert Bihari, MD Electronically Signed   By: Wilbert Bihari M.D.   On: 03/02/2024 17:30  Disposition   Pt is being discharged  home today in good condition.  Follow-up Plans & Appointments     Discharge Instructions     Amb Referral to Cardiac Rehabilitation   Complete by: As directed    Diagnosis:  Coronary Stents PTCA     After initial evaluation and assessments completed: Virtual Based Care may be provided alone or in conjunction with Phase 2 Cardiac Rehab based on patient barriers.: Yes   Intensive Cardiac Rehabilitation (ICR) [facility] location only OR Traditional Cardiac Rehabilitation (TCR) *If criteria for ICR are not met will enroll in TCR [facility] only): Yes        Discharge Medications   Allergies as of 03/09/2024       Reactions   Bee Venom Hives, Swelling   Yellow Jacket   Penicillins Other (See Comments)   Did it involve swelling of the face/tongue/throat, SOB, or low BP? Unknown Did it involve sudden or severe rash/hives, skin peeling, or any reaction on the inside of your mouth or nose? Unknown Did you need to seek medical attention at a hospital or doctor's office? Unknown When did it last happen?   Over 10 years    If all above answers are NO, may proceed with cephalosporin use.   Oxycodone Hcl Itching        Medication List     STOP taking these medications    metoprolol  tartrate 100 MG tablet Commonly known as: Lopressor        TAKE these medications    aspirin 81 MG tablet Take 81 mg by mouth every evening.   clopidogrel 75 MG tablet Commonly known as: PLAVIX Take 1 tablet (75 mg total) by mouth daily with breakfast. Start taking on: March 10, 2024   furosemide  20 MG tablet Commonly known as: LASIX  TAKE 2 TABLETS BY MOUTH AS NEEDED FOR EDEMA (SWELLING) **THIS IS A DOSE INCREASE**   glipiZIDE 2.5 MG 24 hr tablet Commonly known as: GLUCOTROL XL Take 2.5 mg by mouth daily.   levothyroxine  88 MCG tablet Commonly known as: SYNTHROID  Take 1 tablet by mouth once daily   loratadine 10 MG tablet Commonly known as: CLARITIN Take 10 mg by mouth daily as needed for  allergies.   losartan 25 MG tablet Commonly known as: COZAAR Take 25 mg by mouth daily.   meclizine  25 MG tablet Commonly known as: ANTIVERT  Take 1 tablet (25 mg total) by mouth 3 (three) times daily as needed for dizziness. May cause drowsiness   metFORMIN 500 MG tablet Commonly known as: GLUCOPHAGE Take 2 tablets (1,000 mg total) by mouth 2 (two) times daily. Start taking on: March 11, 2024 What changed: These instructions start on March 11, 2024. If you are unsure what to do until then, ask your doctor or other care provider.   metoprolol  succinate 25 MG 24 hr tablet Commonly known as: TOPROL -XL Take 1 tablet by mouth once daily   nitroGLYCERIN  0.4 MG SL tablet Commonly known as: NITROSTAT  Place 1 tablet (0.4 mg total) under the tongue every 5 (five) minutes as needed.   rOPINIRole 0.5 MG tablet Commonly known as: REQUIP Take by mouth.   rosuvastatin 20 MG tablet Commonly known as: CRESTOR Take 1 tablet (20 mg total) by mouth daily.   SYSTANE COMPLETE OP Apply 1 drop to eye daily.   Systane Nighttime Oint Apply 1 Application to eye at bedtime as needed (dry eye).   Turmeric 500 MG Tabs Take 500 mg by mouth every evening.  Vitamin D3 25 MCG (1000 UT) Caps Take 1 capsule by mouth every evening.   zinc gluconate 50 MG tablet Take 50 mg by mouth daily.           Allergies Allergies  Allergen Reactions   Bee Venom Hives and Swelling    Yellow Jacket   Penicillins Other (See Comments)    Did it involve swelling of the face/tongue/throat, SOB, or low BP? Unknown Did it involve sudden or severe rash/hives, skin peeling, or any reaction on the inside of your mouth or nose? Unknown Did you need to seek medical attention at a hospital or doctor's office? Unknown When did it last happen?   Over 10 years    If all above answers are NO, may proceed with cephalosporin use.    Oxycodone Hcl Itching    Outstanding Labs/Studies    Duration of Discharge  Encounter   Greater than 30 minutes including physician time.  Signed, Rollo FABIENE Louder, PA-C 03/09/2024, 3:18 PM

## 2024-03-09 NOTE — Interval H&P Note (Signed)
 History and Physical Interval Note:  03/09/2024 9:38 AM  Duane Stevens  has presented today for surgery, with the diagnosis of abnormal ct.  The various methods of treatment have been discussed with the patient and family. After consideration of risks, benefits and other options for treatment, the patient has consented to  Procedure(s): LEFT HEART CATH AND CORONARY ANGIOGRAPHY (N/A) as a surgical intervention.  The patient's history has been reviewed, patient examined, no change in status, stable for surgery.  I have reviewed the patient's chart and labs.  Questions were answered to the patient's satisfaction.   Cath Lab Visit (complete for each Cath Lab visit)  Clinical Evaluation Leading to the Procedure:   ACS: No.  Non-ACS:    Anginal Classification: CCS II  Anti-ischemic medical therapy: Minimal Therapy (1 class of medications)  Non-Invasive Test Results: Intermediate-risk stress test findings: cardiac mortality 1-3%/year  Prior CABG: No previous CABG        [name] 03/09/2024 9:38 AM

## 2024-03-10 ENCOUNTER — Encounter (HOSPITAL_COMMUNITY): Payer: Self-pay | Admitting: Cardiology

## 2024-03-11 ENCOUNTER — Encounter (HOSPITAL_COMMUNITY)
Admission: RE | Admit: 2024-03-11 | Discharge: 2024-03-11 | Disposition: A | Discharge status: Home / Self Care | Source: Ambulatory Visit | Attending: Cardiology | Admitting: Cardiology

## 2024-03-11 DIAGNOSIS — Z955 Presence of coronary angioplasty implant and graft: Secondary | ICD-10-CM | POA: Insufficient documentation

## 2024-03-14 NOTE — Progress Notes (Signed)
 Virtual orientation visit completed for cardiac rehab with status post coronary artery stent placement. On-site orientation visit scheduled for Monday 03/21/24. Documentation 1 pm.

## 2024-03-21 ENCOUNTER — Encounter (HOSPITAL_COMMUNITY)
Admission: RE | Admit: 2024-03-21 | Discharge: 2024-03-21 | Disposition: A | Discharge status: Home / Self Care | Payer: Self-pay | Source: Ambulatory Visit | Attending: Cardiology | Admitting: Cardiology

## 2024-03-21 VITALS — Ht 69.0 in | Wt 197.2 lb

## 2024-03-21 DIAGNOSIS — Z955 Presence of coronary angioplasty implant and graft: Secondary | ICD-10-CM | POA: Insufficient documentation

## 2024-03-21 NOTE — Progress Notes (Signed)
 Cardiac Individual Treatment Plan  Patient Details  Name: Duane Stevens MRN: [id number] Date of Birth: 1945/11/03 Referring Provider:   Flowsheet Row CARDIAC REHAB PHASE II ORIENTATION from 03/21/2024 in [facility] CARDIAC REHABILITATION  Referring Provider Alvan Carrier MD    Initial Encounter Date:  Flowsheet Row CARDIAC REHAB PHASE II ORIENTATION from 03/21/2024 in [place] [facility]  Date 03/21/24    Visit Diagnosis: Status post coronary artery stent placement  Patient's Home Medications on Admission:  Current Outpatient Medications:    aspirin  81 MG tablet, Take 81 mg by mouth every evening., Disp: , Rfl:    Cholecalciferol  (VITAMIN D3) 1000 UNITS CAPS, Take 1 capsule by mouth every evening., Disp: , Rfl:    clopidogrel  (PLAVIX ) 75 MG tablet, Take 1 tablet (75 mg total) by mouth daily with breakfast., Disp: 90 tablet, Rfl: 3   furosemide  (LASIX ) 20 MG tablet, TAKE 2 TABLETS BY MOUTH AS NEEDED FOR EDEMA (SWELLING) **THIS IS A DOSE INCREASE**, Disp: 60 tablet, Rfl: 6   glipiZIDE  (GLUCOTROL  XL) 2.5 MG 24 hr tablet, Take 2.5 mg by mouth daily., Disp: , Rfl:    levothyroxine  (SYNTHROID ) 88 MCG tablet, Take 1 tablet by mouth once daily, Disp: 90 tablet, Rfl: 0   loratadine (CLARITIN) 10 MG tablet, Take 10 mg by mouth daily as needed for allergies., Disp: , Rfl:    losartan  (COZAAR ) 25 MG tablet, Take 25 mg by mouth daily. , Disp: , Rfl:    meclizine  (ANTIVERT ) 25 MG tablet, Take 1 tablet (25 mg total) by mouth 3 (three) times daily as needed for dizziness. May cause drowsiness, Disp: 30 tablet, Rfl: 0   metFORMIN  (GLUCOPHAGE ) 500 MG tablet, Take 2 tablets (1,000 mg total) by mouth 2 (two) times daily., Disp: , Rfl:    metoprolol  succinate (TOPROL -XL) 25 MG 24 hr tablet, Take 1 tablet by mouth once daily, Disp: 90 tablet, Rfl: 3   nitroGLYCERIN  (NITROSTAT ) 0.4 MG SL tablet, Place 1 tablet (0.4 mg total) under the tongue every 5 (five) minutes as needed., Disp: 25 tablet,  Rfl: 3   Propylene Glycol (SYSTANE COMPLETE OP), Apply 1 drop to eye daily., Disp: , Rfl:    rOPINIRole (REQUIP) 0.5 MG tablet, Take by mouth., Disp: , Rfl:    rosuvastatin  (CRESTOR ) 20 MG tablet, Take 1 tablet (20 mg total) by mouth daily., Disp: 90 tablet, Rfl: 3   Turmeric 500 MG TABS, Take 500 mg by mouth every evening., Disp: , Rfl:    White Petrolatum-Mineral Oil (SYSTANE NIGHTTIME) OINT, Apply 1 Application to eye at bedtime as needed (dry eye)., Disp: , Rfl:    zinc gluconate 50 MG tablet, Take 50 mg by mouth daily., Disp: , Rfl:   Past Medical History: Past Medical History:  Diagnosis Date   Allergy    Arthritis    Diabetes mellitus    Hypercholesterolemia    Hypertension    Hypothyroidism    Prostate cancer (HCC)    hx of   Thyroid  disease     Tobacco Use: Social History   Tobacco Use  Smoking Status Former  Smokeless Tobacco Never  Tobacco Comments   quit 20-30 years ago    Labs: Review Flowsheet       Latest Ref Rng & Units 03/04/2024  Labs for ITP Cardiac and Pulmonary Rehab  Cholestrol 100 - 199 mg/dL 848   LDL (calc) 0 - 99 mg/dL 80   HDL-C >60 mg/dL 56   Trlycerides 0 - 850 mg/dL 76  Exercise Target Goals: Exercise Program Goal: Individual exercise prescription set using results from initial 6 min walk test and THRR while considering  patient's activity barriers and safety.   Exercise Prescription Goal: Initial exercise prescription builds to 30-45 minutes a day of aerobic activity, 2-3 days per week.  Home exercise guidelines will be given to patient during program as part of exercise prescription that the participant will acknowledge.   Education: Aerobic Exercise: - Group verbal and visual presentation on the components of exercise prescription. Introduces F.I.T.T principle from ACSM for exercise prescriptions.  Reviews F.I.T.T. principles of aerobic exercise including progression. Written material provided at class time. Flowsheet Row  CARDIAC REHAB PHASE II ORIENTATION from 03/21/2024 in [place] [facility]  Education need identified 03/21/24    Education: Resistance Exercise: - Group verbal and visual presentation on the components of exercise prescription. Introduces F.I.T.T principle from ACSM for exercise prescriptions  Reviews F.I.T.T. principles of resistance exercise including progression. Written material provided at class time.    Education: Exercise & Equipment Safety: - Individual verbal instruction and demonstration of equipment use and safety with use of the equipment.   Education: Exercise Physiology & General Exercise Guidelines: - Group verbal and written instruction with models to review the exercise physiology of the cardiovascular system and associated critical values. Provides general exercise guidelines with specific guidelines to those with heart or lung disease. Written material provided at class time.   Education: Flexibility, Balance, Mind/Body Relaxation: - Group verbal and visual presentation with interactive activity on the components of exercise prescription. Introduces F.I.T.T principle from ACSM for exercise prescriptions. Reviews F.I.T.T. principles of flexibility and balance exercise training including progression. Also discusses the mind body connection.  Reviews various relaxation techniques to help reduce and manage stress (i.e. Deep breathing, progressive muscle relaxation, and visualization). Balance handout provided to take home. Written material provided at class time.   Activity Barriers & Risk Stratification:  Activity Barriers & Cardiac Risk Stratification - 03/14/24 1352       Activity Barriers & Cardiac Risk Stratification   Activity Barriers Balance Concerns;Arthritis;Assistive Device   Uses cane and walker.   Cardiac Risk Stratification Moderate          6 Minute Walk:  6 Minute Walk     Row Name 03/21/24 1542         6 Minute Walk   Phase Initial      Distance 670 feet     Walk Time 6 minutes     # of Rest Breaks 0     MPH 1.26     METS 1.29     RPE 15     Perceived Dyspnea  3     VO2 Peak 4.53     Symptoms Yes (comment)     Comments SOB, knee pain 3/10     Resting HR 66 bpm     Resting BP 112/66     Resting Oxygen Saturation  95 %     Exercise Oxygen Saturation  during 6 min walk 89 %     Max Ex. HR 99 bpm     Max Ex. BP 146/72     2 Minute Post BP 122/60        Oxygen Initial Assessment:   Oxygen Re-Evaluation:   Oxygen Discharge (Final Oxygen Re-Evaluation):   Initial Exercise Prescription:  Initial Exercise Prescription - 03/21/24 1600       Date of Initial Exercise RX and Referring Provider   Date 03/21/24  Referring Provider Alvan Carrier MD      Oxygen   Maintain Oxygen Saturation 88% or higher      NuStep   Level 1    SPM 80    Minutes 15    METs 2      Arm Ergometer   Level 1    RPM 50    Minutes 15    METs 2      Prescription Details   Frequency (times per week) 3    Duration Progress to 30 minutes of continuous aerobic without signs/symptoms of physical distress      Intensity   THRR 40-80% of Max Heartrate 96-125    Ratings of Perceived Exertion 11-13    Perceived Dyspnea 0-4      Progression   Progression Continue to progress workloads to maintain intensity without signs/symptoms of physical distress.      Resistance Training   Training Prescription Yes    Weight 3 lb    Reps 10-15          Perform Capillary Blood Glucose checks as needed.  Exercise Prescription Changes:   Exercise Prescription Changes     Row Name 03/21/24 1600             Response to Exercise   Blood Pressure (Admit) 112/66       Blood Pressure (Exercise) 146/72       Blood Pressure (Exit) 122/60       Heart Rate (Admit) 66 bpm       Heart Rate (Exercise) 99 bpm       Heart Rate (Exit) 76 bpm       Oxygen Saturation (Admit) 95 %       Oxygen Saturation (Exercise) 89 %       Rating  of Perceived Exertion (Exercise) 15       Perceived Dyspnea (Exercise) 3       Symptoms SOB, knee pain 3/10       Comments walk test results          Exercise Comments:   Exercise Goals and Review:   Exercise Goals     Row Name 03/21/24 1606             Exercise Goals   Increase Physical Activity Yes       Intervention Provide advice, education, support and counseling about physical activity/exercise needs.;Develop an individualized exercise prescription for aerobic and resistive training based on initial evaluation findings, risk stratification, comorbidities and participant's personal goals.       Expected Outcomes Short Term: Attend rehab on a regular basis to increase amount of physical activity.;Long Term: Exercising regularly at least 3-5 days a week.;Long Term: Add in home exercise to make exercise part of routine and to increase amount of physical activity.       Increase Strength and Stamina Yes       Intervention Provide advice, education, support and counseling about physical activity/exercise needs.;Develop an individualized exercise prescription for aerobic and resistive training based on initial evaluation findings, risk stratification, comorbidities and participant's personal goals.       Expected Outcomes Short Term: Increase workloads from initial exercise prescription for resistance, speed, and METs.;Short Term: Perform resistance training exercises routinely during rehab and add in resistance training at home;Long Term: Improve cardiorespiratory fitness, muscular endurance and strength as measured by increased METs and functional capacity ( )       Able to understand and use rate of perceived exertion (RPE) scale  Yes       Intervention Provide education and explanation on how to use RPE scale       Expected Outcomes Short Term: Able to use RPE daily in rehab to express subjective intensity level;Long Term:  Able to use RPE to guide intensity level when exercising  independently       Able to understand and use Dyspnea scale Yes       Intervention Provide education and explanation on how to use Dyspnea scale       Expected Outcomes Short Term: Able to use Dyspnea scale daily in rehab to express subjective sense of shortness of breath during exertion;Long Term: Able to use Dyspnea scale to guide intensity level when exercising independently       Knowledge and understanding of Target Heart Rate Range (THRR) Yes       Intervention Provide education and explanation of THRR including how the numbers were predicted and where they are located for reference       Expected Outcomes Short Term: Able to state/look up THRR;Long Term: Able to use THRR to govern intensity when exercising independently;Short Term: Able to use daily as guideline for intensity in rehab       Able to check pulse independently Yes       Intervention Provide education and demonstration on how to check pulse in carotid and radial arteries.;Review the importance of being able to check your own pulse for safety during independent exercise       Expected Outcomes Short Term: Able to explain why pulse checking is important during independent exercise;Long Term: Able to check pulse independently and accurately       Understanding of Exercise Prescription Yes       Intervention Provide education, explanation, and written materials on patient's individual exercise prescription       Expected Outcomes Short Term: Able to explain program exercise prescription;Long Term: Able to explain home exercise prescription to exercise independently          Exercise Goals Re-Evaluation :   Discharge Exercise Prescription (Final Exercise Prescription Changes):  Exercise Prescription Changes - 03/21/24 1600       Response to Exercise   Blood Pressure (Admit) 112/66    Blood Pressure (Exercise) 146/72    Blood Pressure (Exit) 122/60    Heart Rate (Admit) 66 bpm    Heart Rate (Exercise) 99 bpm    Heart Rate  (Exit) 76 bpm    Oxygen Saturation (Admit) 95 %    Oxygen Saturation (Exercise) 89 %    Rating of Perceived Exertion (Exercise) 15    Perceived Dyspnea (Exercise) 3    Symptoms SOB, knee pain 3/10    Comments walk test results          Nutrition:  Target Goals: Understanding of nutrition guidelines, daily intake of sodium 1500mg , cholesterol 200mg , calories 30% from fat and 7% or less from saturated fats, daily to have 5 or more servings of fruits and vegetables.  Education: Nutrition 1 -Group instruction provided by verbal, written material, interactive activities, discussions, models, and posters to present general guidelines for heart healthy nutrition including macronutrients, label reading, and promoting whole foods over processed counterparts. Education serves as pensions consultant of discussion of heart healthy eating for all. Written material provided at class time.    Education: Nutrition 2 -Group instruction provided by verbal, written material, interactive activities, discussions, models, and posters to present general guidelines for heart healthy nutrition including sodium, cholesterol, and saturated fat.  Providing guidance of habit forming to improve blood pressure, cholesterol, and body weight. Written material provided at class time.     Biometrics:  Pre Biometrics - 03/21/24 1607       Pre Biometrics   Height 5' 9 (1.753 m)    Weight 89.4 kg    Waist Circumference 43 inches    Hip Circumference 42 inches    Waist to Hip Ratio 1.02 %    BMI (Calculated) 29.11    Grip Strength 28.1 kg    Single Leg Stand 0 seconds           Nutrition Therapy Plan and Nutrition Goals:  Nutrition Therapy & Goals - 03/21/24 1607       Intervention Plan   Intervention Prescribe, educate and counsel regarding individualized specific dietary modifications aiming towards targeted core components such as weight, hypertension, lipid management, diabetes, heart failure and other  comorbidities.;Nutrition handout(s) given to patient.    Expected Outcomes Short Term Goal: Understand basic principles of dietary content, such as calories, fat, sodium, cholesterol and nutrients.;Long Term Goal: Adherence to prescribed nutrition plan.          Nutrition Assessments:  MEDIFICTS Score Key: >=70 Need to make dietary changes  40-70 Heart Healthy Diet <= 40 Therapeutic Level Cholesterol Diet  Flowsheet Row CARDIAC REHAB PHASE II ORIENTATION from 03/21/2024 in [facility] CARDIAC RE[facility]ITATION  Picture Your Plate Total Score on Admission 51   Picture Your Plate Scores: <59 Unhealthy dietary pattern with much room for improvement. 41-50 Dietary pattern unlikely to meet recommendations for good health and room for improvement. 51-60 More healthful dietary pattern, with some room for improvement.  >60 Healthy dietary pattern, although there may be some specific behaviors that could be improved.    Nutrition Goals Re-Evaluation:   Nutrition Goals Discharge (Final Nutrition Goals Re-Evaluation):   Psychosocial: Target Goals: Acknowledge presence or absence of significant depression and/or stress, maximize coping skills, provide positive support system. Participant is able to verbalize types and ability to use techniques and skills needed for reducing stress and depression.   Education: Stress, Anxiety, and Depression - Group verbal and visual presentation to define topics covered.  Reviews how body is impacted by stress, anxiety, and depression.  Also discusses healthy ways to reduce stress and to treat/manage anxiety and depression. Written material provided at class time.   Education: Sleep Hygiene -Provides group verbal and written instruction about how sleep can affect your health.  Define sleep hygiene, discuss sleep cycles and impact of sleep habits. Review good sleep hygiene tips.   Initial Review & Psychosocial Screening:  Initial Psych Review & Screening -  03/14/24 1405       Initial Review   Current issues with None Identified      Family Dynamics   Good Support System? Yes    Comments Patient's wife is his main support.      Barriers   Psychosocial barriers to participate in program The patient should benefit from training in stress management and relaxation.;Psychosocial barriers identified (see note)      Screening Interventions   Interventions Provide feedback about the scores to participant;Encouraged to exercise    Expected Outcomes Short Term goal: Utilizing psychosocial counselor, staff and physician to assist with identification of specific Stressors or current issues interfering with healing process. Setting desired goal for each stressor or current issue identified.;Long Term Goal: Stressors or current issues are controlled or eliminated.;Short Term goal: Identification and review with participant of any Quality  of Life or Depression concerns found by scoring the questionnaire.;Long Term goal: The participant improves quality of Life and PHQ9 Scores as seen by post scores and/or verbalization of changes          Quality of Life Scores:   Quality of Life - 03/21/24 1607       Quality of Life   Select Quality of Life      Quality of Life Scores   Health/Function Pre 23.46 %    Socioeconomic Pre 25.5 %    Psych/Spiritual Pre 29.64 %    Family Pre 26.4 %    GLOBAL Pre 25.66 %         Scores of 19 and below usually indicate a poorer quality of life in these areas.  A difference of  2-3 points is a clinically meaningful difference.  A difference of 2-3 points in the total score of the Quality of Life Index has been associated with significant improvement in overall quality of life, self-image, physical symptoms, and general health in studies assessing change in quality of life.  PHQ-9: Review Flowsheet       03/21/2024  Depression screen PHQ 2/9  Decreased Interest 0  Down, Depressed, Hopeless 0  PHQ - 2 Score 0   Altered sleeping 0  Tired, decreased energy 2  Change in appetite 0  Feeling bad or failure about yourself  0  Trouble concentrating 0  Moving slowly or fidgety/restless 2  Suicidal thoughts 0  PHQ-9 Score 4  Difficult doing work/chores Somewhat difficult   Interpretation of Total Score  Total Score Depression Severity:  1-4 = Minimal depression, 5-9 = Mild depression, 10-14 = Moderate depression, 15-19 = Moderately severe depression, 20-27 = Severe depression   Psychosocial Evaluation and Intervention:  Psychosocial Evaluation - 03/14/24 1406       Psychosocial Evaluation & Interventions   Interventions Stress management education;Relaxation education;Encouraged to exercise with the program and follow exercise prescription    Comments Patient was referred to cardiac rehab with stent placement 03/09/24. I spoke with his wife. He says she does better with answering questions. She denies any depression or anxiety. She reports he sleeps okay at night. He is retired but still remains active. He enjoys working with wood. She says he continues to have some bleeding from his catheterization site/right wrist and would like to follow up with Dr. Alvan on 12/5 before he starts the program. She is good with proceeding with his orientation visit 12/1. His goals for the program are to improve his mobility overall; improve his balance and walking. He uses a cane or a walker for ambulation. He has vertigo at times and he uses a walker with these episodes. He has no barriers identified to complete the program.    Expected Outcomes Short Term: Patient will start the program and attend consistently. Long Term: Patient will complete the program meeting personal goals.    Continue Psychosocial Services  Follow up required by staff          Psychosocial Re-Evaluation:   Psychosocial Discharge (Final Psychosocial Re-Evaluation):   Vocational Rehabilitation: Provide vocational rehab assistance to  qualifying candidates.   Vocational Rehab Evaluation & Intervention:  Vocational Rehab - 03/14/24 1405       Initial Vocational Rehab Evaluation & Intervention   Assessment shows need for Vocational Rehabilitation No      Vocational Rehab Re-Evaulation   Comments Retired.          Education: Education Goals: Education classes  will be provided on a variety of topics geared toward better understanding of heart health and risk factor modification. Participant will state understanding/return demonstration of topics presented as noted by education test scores.  Learning Barriers/Preferences:  Learning Barriers/Preferences - 03/14/24 1405       Learning Barriers/Preferences   Learning Barriers None    Learning Preferences Skilled Demonstration;Computer/Internet          General Cardiac Education Topics:  AED/CPR: - Group verbal and written instruction with the use of models to demonstrate the basic use of the AED with the basic ABC's of resuscitation.   Test and Procedures: - Group verbal and visual presentation and models provide information about basic cardiac anatomy and function. Reviews the testing methods done to diagnose heart disease and the outcomes of the test results. Describes the treatment choices: Medical Management, Angioplasty, or Coronary Bypass Surgery for treating various heart conditions including Myocardial Infarction, Angina, Valve Disease, and Cardiac Arrhythmias. Written material provided at class time. Flowsheet Row CARDIAC REHAB PHASE II ORIENTATION from 03/21/2024 in [place] [facility]  Education need identified 03/21/24    Medication Safety: - Group verbal and visual instruction to review commonly prescribed medications for heart and lung disease. Reviews the medication, class of the drug, and side effects. Includes the steps to properly store meds and maintain the prescription regimen. Written material provided at class  time.   Intimacy: - Group verbal instruction through game format to discuss how heart and lung disease can affect sexual intimacy. Written material provided at class time.   Know Your Numbers and Heart Failure: - Group verbal and visual instruction to discuss disease risk factors for cardiac and pulmonary disease and treatment options.  Reviews associated critical values for Overweight/Obesity, Hypertension, Cholesterol, and Diabetes.  Discusses basics of heart failure: signs/symptoms and treatments.  Introduces Heart Failure Zone chart for action plan for heart failure. Written material provided at class time.   Infection Prevention: - Provides verbal and written material to individual with discussion of infection control including proper hand washing and proper equipment cleaning during exercise session.   Falls Prevention: - Provides verbal and written material to individual with discussion of falls prevention and safety.   Other: -Provides group and verbal instruction on various topics (see comments)   Knowledge Questionnaire Score:  Knowledge Questionnaire Score - 03/21/24 1608       Knowledge Questionnaire Score   Pre Score 21/26          Core Components/Risk Factors/Patient Goals at Admission:  Personal Goals and Risk Factors at Admission - 03/21/24 1608       Core Components/Risk Factors/Patient Goals on Admission    Weight Management Yes;Weight Maintenance;Weight Loss    Intervention Weight Management: Develop a combined nutrition and exercise program designed to reach desired caloric intake, while maintaining appropriate intake of nutrient and fiber, sodium and fats, and appropriate energy expenditure required for the weight goal.;Weight Management: Provide education and appropriate resources to help participant work on and attain dietary goals.    Admit Weight 197 lb 3.2 oz (89.4 kg)    Goal Weight: Short Term 195 lb (88.5 kg)    Goal Weight: Long Term 193 lb  (87.5 kg)    Expected Outcomes Short Term: Continue to assess and modify interventions until short term weight is achieved;Long Term: Adherence to nutrition and physical activity/exercise program aimed toward attainment of established weight goal;Weight Maintenance: Understanding of the daily nutrition guidelines, which includes 25-35% calories from fat, 7% or  less cal from saturated fats, less than 200mg  cholesterol, less than 1.5gm of sodium, & 5 or more servings of fruits and vegetables daily;Weight Loss: Understanding of general recommendations for a balanced deficit meal plan, which promotes 1-2 lb weight loss per week and includes a negative energy balance of [phone] kcal/d;Understanding recommendations for meals to include 15-35% energy as protein, 25-35% energy from fat, 35-60% energy from carbohydrates, less than 200mg  of dietary cholesterol, 20-35 gm of total fiber daily;Understanding of distribution of calorie intake throughout the day with the consumption of 4-5 meals/snacks    Improve shortness of breath with ADL's Yes    Intervention Provide education, individualized exercise plan and daily activity instruction to help decrease symptoms of SOB with activities of daily living.    Expected Outcomes Short Term: Improve cardiorespiratory fitness to achieve a reduction of symptoms when performing ADLs;Long Term: Be able to perform more ADLs without symptoms or delay the onset of symptoms    Diabetes Yes    Intervention Provide education about signs/symptoms and action to take for hypo/hyperglycemia.;Provide education about proper nutrition, including hydration, and aerobic/resistive exercise prescription along with prescribed medications to achieve blood glucose in normal ranges: Fasting glucose 65-99 mg/dL    Expected Outcomes Short Term: Participant verbalizes understanding of the signs/symptoms and immediate care of hyper/hypoglycemia, proper foot care and importance of medication,  aerobic/resistive exercise and nutrition plan for blood glucose control.;Long Term: Attainment of HbA1C < 7%.    Hypertension Yes    Intervention Provide education on lifestyle modifcations including regular physical activity/exercise, weight management, moderate sodium restriction and increased consumption of fresh fruit, vegetables, and low fat dairy, alcohol moderation, and smoking cessation.;Monitor prescription use compliance.    Expected Outcomes Short Term: Continued assessment and intervention until BP is < 140/11mm HG in hypertensive participants. < 130/41mm HG in hypertensive participants with diabetes, heart failure or chronic kidney disease.;Long Term: Maintenance of blood pressure at goal levels.    Lipids Yes    Intervention Provide education and support for participant on nutrition & aerobic/resistive exercise along with prescribed medications to achieve LDL 70mg , HDL >40mg .    Expected Outcomes Short Term: Participant states understanding of desired cholesterol values and is compliant with medications prescribed. Participant is following exercise prescription and nutrition guidelines.;Long Term: Cholesterol controlled with medications as prescribed, with individualized exercise RX and with personalized nutrition plan. Value goals: LDL < 70mg , HDL > 40 mg.          Education:Diabetes - Individual verbal and written instruction to review signs/symptoms of diabetes, desired ranges of glucose level fasting, after meals and with exercise. Acknowledge that pre and post exercise glucose checks will be done for 3 sessions at entry of program.   Core Components/Risk Factors/Patient Goals Review:    Core Components/Risk Factors/Patient Goals at Discharge (Final Review):    ITP Comments:  ITP Comments     Row Name 03/14/24 1413 03/21/24 1539         ITP Comments Virtual orientation visit completed for cardiac rehab with status post coronary artery stent placement. On-site  orientation visit scheduled for Monday 03/21/24. Documentation 1 pm. Patient attend orientation today.  Patient is attending Cardiac Rehabilitation Program.  Documentation for diagnosis can be found in [facility] 03/09/24.  Reviewed medical chart, RPE/RPD, gym safety, and program guidelines.  Patient was fitted to equipment they will be using during rehab.  Patient is scheduled to start exercise on Wednesday 03/30/24 at 1430.   Initial ITP created and sent for review  and signature by Dr. Dorn Ross, Medical Director for Cardiac Rehabilitation Program.         Comments: Initial ITP

## 2024-03-21 NOTE — Patient Instructions (Signed)
 Patient Instructions  Patient Details  Name: Duane Stevens MRN: [id number] Date of Birth: 1945/05/04 Referring Provider:  Alvan Dorn FALCON, MD  Below are your personal goals for exercise, nutrition, and risk factors. Our goal is to help you stay on track towards obtaining and maintaining these goals. We will be discussing your progress on these goals with you throughout the program.  Initial Exercise Prescription:  Initial Exercise Prescription - 03/21/24 1600       Date of Initial Exercise RX and Referring Provider   Date 03/21/24    Referring Provider Alvan Dorn MD      Oxygen   Maintain Oxygen Saturation 88% or higher      NuStep   Level 1    SPM 80    Minutes 15    METs 2      Arm Ergometer   Level 1    RPM 50    Minutes 15    METs 2      Prescription Details   Frequency (times per week) 3    Duration Progress to 30 minutes of continuous aerobic without signs/symptoms of physical distress      Intensity   THRR 40-80% of Max Heartrate 96-125    Ratings of Perceived Exertion 11-13    Perceived Dyspnea 0-4      Progression   Progression Continue to progress workloads to maintain intensity without signs/symptoms of physical distress.      Resistance Training   Training Prescription Yes    Weight 3 lb    Reps 10-15          Exercise Goals: Frequency: Be able to perform aerobic exercise two to three times per week in program working toward 2-5 days per week of home exercise.  Intensity: Work with a perceived exertion of 11 (fairly light) - 15 (hard) while following your exercise prescription.  We will make changes to your prescription with you as you progress through the program.   Duration: Be able to do 30 to 45 minutes of continuous aerobic exercise in addition to a 5 minute warm-up and a 5 minute cool-down routine.   Nutrition Goals: Your personal nutrition goals will be established when you do your nutrition analysis with the dietician.  The  following are general nutrition guidelines to follow: Cholesterol < 200mg /day Sodium < 1500mg /day Fiber: Men over 50 yrs - 30 grams per day  Personal Goals:  Personal Goals and Risk Factors at Admission - 03/21/24 1608       Core Components/Risk Factors/Patient Goals on Admission    Weight Management Yes;Weight Maintenance;Weight Loss    Intervention Weight Management: Develop a combined nutrition and exercise program designed to reach desired caloric intake, while maintaining appropriate intake of nutrient and fiber, sodium and fats, and appropriate energy expenditure required for the weight goal.;Weight Management: Provide education and appropriate resources to help participant work on and attain dietary goals.    Admit Weight 197 lb 3.2 oz (89.4 kg)    Goal Weight: Short Term 195 lb (88.5 kg)    Goal Weight: Long Term 193 lb (87.5 kg)    Expected Outcomes Short Term: Continue to assess and modify interventions until short term weight is achieved;Long Term: Adherence to nutrition and physical activity/exercise program aimed toward attainment of established weight goal;Weight Maintenance: Understanding of the daily nutrition guidelines, which includes 25-35% calories from fat, 7% or less cal from saturated fats, less than 200mg  cholesterol, less than 1.5gm of sodium, & 5 or more servings  of fruits and vegetables daily;Weight Loss: Understanding of general recommendations for a balanced deficit meal plan, which promotes 1-2 lb weight loss per week and includes a negative energy balance of [phone] kcal/d;Understanding recommendations for meals to include 15-35% energy as protein, 25-35% energy from fat, 35-60% energy from carbohydrates, less than 200mg  of dietary cholesterol, 20-35 gm of total fiber daily;Understanding of distribution of calorie intake throughout the day with the consumption of 4-5 meals/snacks    Improve shortness of breath with ADL's Yes    Intervention Provide education,  individualized exercise plan and daily activity instruction to help decrease symptoms of SOB with activities of daily living.    Expected Outcomes Short Term: Improve cardiorespiratory fitness to achieve a reduction of symptoms when performing ADLs;Long Term: Be able to perform more ADLs without symptoms or delay the onset of symptoms    Diabetes Yes    Intervention Provide education about signs/symptoms and action to take for hypo/hyperglycemia.;Provide education about proper nutrition, including hydration, and aerobic/resistive exercise prescription along with prescribed medications to achieve blood glucose in normal ranges: Fasting glucose 65-99 mg/dL    Expected Outcomes Short Term: Participant verbalizes understanding of the signs/symptoms and immediate care of hyper/hypoglycemia, proper foot care and importance of medication, aerobic/resistive exercise and nutrition plan for blood glucose control.;Long Term: Attainment of HbA1C < 7%.    Hypertension Yes    Intervention Provide education on lifestyle modifcations including regular physical activity/exercise, weight management, moderate sodium restriction and increased consumption of fresh fruit, vegetables, and low fat dairy, alcohol moderation, and smoking cessation.;Monitor prescription use compliance.    Expected Outcomes Short Term: Continued assessment and intervention until BP is < 140/30mm HG in hypertensive participants. < 130/62mm HG in hypertensive participants with diabetes, heart failure or chronic kidney disease.;Long Term: Maintenance of blood pressure at goal levels.    Lipids Yes    Intervention Provide education and support for participant on nutrition & aerobic/resistive exercise along with prescribed medications to achieve LDL 70mg , HDL >40mg .    Expected Outcomes Short Term: Participant states understanding of desired cholesterol values and is compliant with medications prescribed. Participant is following exercise prescription  and nutrition guidelines.;Long Term: Cholesterol controlled with medications as prescribed, with individualized exercise RX and with personalized nutrition plan. Value goals: LDL < 70mg , HDL > 40 mg.          Tobacco Use Initial Evaluation: Social History   Tobacco Use  Smoking Status Former  Smokeless Tobacco Never  Tobacco Comments   quit 20-30 years ago    Exercise Goals and Review:  Exercise Goals     Row Name 03/21/24 1606             Exercise Goals   Increase Physical Activity Yes       Intervention Provide advice, education, support and counseling about physical activity/exercise needs.;Develop an individualized exercise prescription for aerobic and resistive training based on initial evaluation findings, risk stratification, comorbidities and participant's personal goals.       Expected Outcomes Short Term: Attend rehab on a regular basis to increase amount of physical activity.;Long Term: Exercising regularly at least 3-5 days a week.;Long Term: Add in home exercise to make exercise part of routine and to increase amount of physical activity.       Increase Strength and Stamina Yes       Intervention Provide advice, education, support and counseling about physical activity/exercise needs.;Develop an individualized exercise prescription for aerobic and resistive training based on initial evaluation findings,  risk stratification, comorbidities and participant's personal goals.       Expected Outcomes Short Term: Increase workloads from initial exercise prescription for resistance, speed, and METs.;Short Term: Perform resistance training exercises routinely during rehab and add in resistance training at home;Long Term: Improve cardiorespiratory fitness, muscular endurance and strength as measured by increased METs and functional capacity ( )       Able to understand and use rate of perceived exertion (RPE) scale Yes       Intervention Provide education and explanation on how  to use RPE scale       Expected Outcomes Short Term: Able to use RPE daily in rehab to express subjective intensity level;Long Term:  Able to use RPE to guide intensity level when exercising independently       Able to understand and use Dyspnea scale Yes       Intervention Provide education and explanation on how to use Dyspnea scale       Expected Outcomes Short Term: Able to use Dyspnea scale daily in rehab to express subjective sense of shortness of breath during exertion;Long Term: Able to use Dyspnea scale to guide intensity level when exercising independently       Knowledge and understanding of Target Heart Rate Range (THRR) Yes       Intervention Provide education and explanation of THRR including how the numbers were predicted and where they are located for reference       Expected Outcomes Short Term: Able to state/look up THRR;Long Term: Able to use THRR to govern intensity when exercising independently;Short Term: Able to use daily as guideline for intensity in rehab       Able to check pulse independently Yes       Intervention Provide education and demonstration on how to check pulse in carotid and radial arteries.;Review the importance of being able to check your own pulse for safety during independent exercise       Expected Outcomes Short Term: Able to explain why pulse checking is important during independent exercise;Long Term: Able to check pulse independently and accurately       Understanding of Exercise Prescription Yes       Intervention Provide education, explanation, and written materials on patient's individual exercise prescription       Expected Outcomes Short Term: Able to explain program exercise prescription;Long Term: Able to explain home exercise prescription to exercise independently        Copy of goals given to participant.

## 2024-03-22 ENCOUNTER — Encounter (HOSPITAL_COMMUNITY): Payer: Self-pay | Admitting: *Deleted

## 2024-03-22 DIAGNOSIS — Z955 Presence of coronary angioplasty implant and graft: Secondary | ICD-10-CM

## 2024-03-22 NOTE — Progress Notes (Signed)
 Cardiac Individual Treatment Plan  Patient Details  Name: Duane Stevens MRN: [id number] Date of Birth: April 17, 1946 Referring Provider:   Flowsheet Row CARDIAC REHAB PHASE II ORIENTATION from 03/21/2024 in [facility] CARDIAC REHABILITATION  Referring Provider Alvan Carrier MD    Initial Encounter Date:  Flowsheet Row CARDIAC REHAB PHASE II ORIENTATION from 03/21/2024 in [place] [facility] CARDIAC REHABILITATION  Date 03/21/24    Visit Diagnosis: Status post coronary artery stent placement  Patient's Home Medications on Admission:  Current Outpatient Medications:    aspirin  81 MG tablet, Take 81 mg by mouth every evening., Disp: , Rfl:    Cholecalciferol  (VITAMIN D3) 1000 UNITS CAPS, Take 1 capsule by mouth every evening., Disp: , Rfl:    clopidogrel  (PLAVIX ) 75 MG tablet, Take 1 tablet (75 mg total) by mouth daily with breakfast., Disp: 90 tablet, Rfl: 3   furosemide  (LASIX ) 20 MG tablet, TAKE 2 TABLETS BY MOUTH AS NEEDED FOR EDEMA (SWELLING) **THIS IS A DOSE INCREASE**, Disp: 60 tablet, Rfl: 6   glipiZIDE  (GLUCOTROL  XL) 2.5 MG 24 hr tablet, Take 2.5 mg by mouth daily., Disp: , Rfl:    levothyroxine  (SYNTHROID ) 88 MCG tablet, Take 1 tablet by mouth once daily, Disp: 90 tablet, Rfl: 0   loratadine (CLARITIN) 10 MG tablet, Take 10 mg by mouth daily as needed for allergies., Disp: , Rfl:    losartan  (COZAAR ) 25 MG tablet, Take 25 mg by mouth daily. , Disp: , Rfl:    meclizine  (ANTIVERT ) 25 MG tablet, Take 1 tablet (25 mg total) by mouth 3 (three) times daily as needed for dizziness. May cause drowsiness, Disp: 30 tablet, Rfl: 0   metFORMIN  (GLUCOPHAGE ) 500 MG tablet, Take 2 tablets (1,000 mg total) by mouth 2 (two) times daily., Disp: , Rfl:    metoprolol  succinate (TOPROL -XL) 25 MG 24 hr tablet, Take 1 tablet by mouth once daily, Disp: 90 tablet, Rfl: 3   nitroGLYCERIN  (NITROSTAT ) 0.4 MG SL tablet, Place 1 tablet (0.4 mg total) under the tongue every 5 (five) minutes as needed., Disp: 25 tablet,  Rfl: 3   Propylene Glycol (SYSTANE COMPLETE OP), Apply 1 drop to eye daily., Disp: , Rfl:    rOPINIRole (REQUIP) 0.5 MG tablet, Take by mouth., Disp: , Rfl:    rosuvastatin  (CRESTOR ) 20 MG tablet, Take 1 tablet (20 mg total) by mouth daily., Disp: 90 tablet, Rfl: 3   Turmeric 500 MG TABS, Take 500 mg by mouth every evening., Disp: , Rfl:    White Petrolatum-Mineral Oil (SYSTANE NIGHTTIME) OINT, Apply 1 Application to eye at bedtime as needed (dry eye)., Disp: , Rfl:    zinc gluconate 50 MG tablet, Take 50 mg by mouth daily., Disp: , Rfl:   Past Medical History: Past Medical History:  Diagnosis Date   Allergy    Arthritis    Diabetes mellitus    Hypercholesterolemia    Hypertension    Hypothyroidism    Prostate cancer (HCC)    hx of   Thyroid  disease     Tobacco Use: Social History   Tobacco Use  Smoking Status Former  Smokeless Tobacco Never  Tobacco Comments   quit 20-30 years ago    Labs: Review Flowsheet       Latest Ref Rng & Units 03/04/2024  Labs for ITP Cardiac and Pulmonary Rehab  Cholestrol 100 - 199 mg/dL 848   LDL (calc) 0 - 99 mg/dL 80   HDL-C >60 mg/dL 56   Trlycerides 0 - 850 mg/dL 76  Capillary Blood Glucose: Lab Results  Component Value Date   GLUCAP 169 (H) 03/09/2024   GLUCAP 163 (H) 03/09/2024   GLUCAP 169 (H) 07/15/2018     Exercise Target Goals: Exercise Program Goal: Individual exercise prescription set using results from initial 6 min walk test and THRR while considering  patient's activity barriers and safety.   Exercise Prescription Goal: Starting with aerobic activity 30 plus minutes a day, 3 days per week for initial exercise prescription. Provide home exercise prescription and guidelines that participant acknowledges understanding prior to discharge.  Activity Barriers & Risk Stratification:  Activity Barriers & Cardiac Risk Stratification - 03/14/24 1352       Activity Barriers & Cardiac Risk Stratification   Activity  Barriers Balance Concerns;Arthritis;Assistive Device   Uses cane and walker.   Cardiac Risk Stratification Moderate          6 Minute Walk:  6 Minute Walk     Row Name 03/21/24 1542         6 Minute Walk   Phase Initial     Distance 670 feet     Walk Time 6 minutes     # of Rest Breaks 0     MPH 1.26     METS 1.29     RPE 15     Perceived Dyspnea  3     VO2 Peak 4.53     Symptoms Yes (comment)     Comments SOB, knee pain 3/10     Resting HR 66 bpm     Resting BP 112/66     Resting Oxygen Saturation  95 %     Exercise Oxygen Saturation  during 6 min walk 89 %     Max Ex. HR 99 bpm     Max Ex. BP 146/72     2 Minute Post BP 122/60        Oxygen Initial Assessment:   Oxygen Re-Evaluation:   Oxygen Discharge (Final Oxygen Re-Evaluation):   Initial Exercise Prescription:  Initial Exercise Prescription - 03/21/24 1600       Date of Initial Exercise RX and Referring Provider   Date 03/21/24    Referring Provider Alvan Carrier MD      Oxygen   Maintain Oxygen Saturation 88% or higher      NuStep   Level 1    SPM 80    Minutes 15    METs 2      Arm Ergometer   Level 1    RPM 50    Minutes 15    METs 2      Prescription Details   Frequency (times per week) 3    Duration Progress to 30 minutes of continuous aerobic without signs/symptoms of physical distress      Intensity   THRR 40-80% of Max Heartrate 96-125    Ratings of Perceived Exertion 11-13    Perceived Dyspnea 0-4      Progression   Progression Continue to progress workloads to maintain intensity without signs/symptoms of physical distress.      Resistance Training   Training Prescription Yes    Weight 3 lb    Reps 10-15          Perform Capillary Blood Glucose checks as needed.  Exercise Prescription Changes:   Exercise Prescription Changes     Row Name 03/21/24 1600             Response to Exercise   Blood Pressure (Admit) 112/66  Blood Pressure (Exercise)  146/72       Blood Pressure (Exit) 122/60       Heart Rate (Admit) 66 bpm       Heart Rate (Exercise) 99 bpm       Heart Rate (Exit) 76 bpm       Oxygen Saturation (Admit) 95 %       Oxygen Saturation (Exercise) 89 %       Rating of Perceived Exertion (Exercise) 15       Perceived Dyspnea (Exercise) 3       Symptoms SOB, knee pain 3/10       Comments walk test results          Exercise Comments:   Exercise Goals and Review:   Exercise Goals     Row Name 03/21/24 1606             Exercise Goals   Increase Physical Activity Yes       Intervention Provide advice, education, support and counseling about physical activity/exercise needs.;Develop an individualized exercise prescription for aerobic and resistive training based on initial evaluation findings, risk stratification, comorbidities and participant's personal goals.       Expected Outcomes Short Term: Attend rehab on a regular basis to increase amount of physical activity.;Long Term: Exercising regularly at least 3-5 days a week.;Long Term: Add in home exercise to make exercise part of routine and to increase amount of physical activity.       Increase Strength and Stamina Yes       Intervention Provide advice, education, support and counseling about physical activity/exercise needs.;Develop an individualized exercise prescription for aerobic and resistive training based on initial evaluation findings, risk stratification, comorbidities and participant's personal goals.       Expected Outcomes Short Term: Increase workloads from initial exercise prescription for resistance, speed, and METs.;Short Term: Perform resistance training exercises routinely during rehab and add in resistance training at home;Long Term: Improve cardiorespiratory fitness, muscular endurance and strength as measured by increased METs and functional capacity ( )       Able to understand and use rate of perceived exertion (RPE) scale Yes        Intervention Provide education and explanation on how to use RPE scale       Expected Outcomes Short Term: Able to use RPE daily in rehab to express subjective intensity level;Long Term:  Able to use RPE to guide intensity level when exercising independently       Able to understand and use Dyspnea scale Yes       Intervention Provide education and explanation on how to use Dyspnea scale       Expected Outcomes Short Term: Able to use Dyspnea scale daily in rehab to express subjective sense of shortness of breath during exertion;Long Term: Able to use Dyspnea scale to guide intensity level when exercising independently       Knowledge and understanding of Target Heart Rate Range (THRR) Yes       Intervention Provide education and explanation of THRR including how the numbers were predicted and where they are located for reference       Expected Outcomes Short Term: Able to state/look up THRR;Long Term: Able to use THRR to govern intensity when exercising independently;Short Term: Able to use daily as guideline for intensity in rehab       Able to check pulse independently Yes       Intervention Provide education and demonstration on how to check  pulse in carotid and radial arteries.;Review the importance of being able to check your own pulse for safety during independent exercise       Expected Outcomes Short Term: Able to explain why pulse checking is important during independent exercise;Long Term: Able to check pulse independently and accurately       Understanding of Exercise Prescription Yes       Intervention Provide education, explanation, and written materials on patient's individual exercise prescription       Expected Outcomes Short Term: Able to explain program exercise prescription;Long Term: Able to explain home exercise prescription to exercise independently          Exercise Goals Re-Evaluation :    Discharge Exercise Prescription (Final Exercise Prescription Changes):  Exercise  Prescription Changes - 03/21/24 1600       Response to Exercise   Blood Pressure (Admit) 112/66    Blood Pressure (Exercise) 146/72    Blood Pressure (Exit) 122/60    Heart Rate (Admit) 66 bpm    Heart Rate (Exercise) 99 bpm    Heart Rate (Exit) 76 bpm    Oxygen Saturation (Admit) 95 %    Oxygen Saturation (Exercise) 89 %    Rating of Perceived Exertion (Exercise) 15    Perceived Dyspnea (Exercise) 3    Symptoms SOB, knee pain 3/10    Comments walk test results          Nutrition:  Target Goals: Understanding of nutrition guidelines, daily intake of sodium 1500mg , cholesterol 200mg , calories 30% from fat and 7% or less from saturated fats, daily to have 5 or more servings of fruits and vegetables.  Biometrics:  Pre Biometrics - 03/21/24 1607       Pre Biometrics   Height 5' 9 (1.753 m)    Weight 197 lb 3.2 oz (89.4 kg)    Waist Circumference 43 inches    Hip Circumference 42 inches    Waist to Hip Ratio 1.02 %    BMI (Calculated) 29.11    Grip Strength 28.1 kg    Single Leg Stand 0 seconds           Nutrition Therapy Plan and Nutrition Goals:  Nutrition Therapy & Goals - 03/21/24 1607       Intervention Plan   Intervention Prescribe, educate and counsel regarding individualized specific dietary modifications aiming towards targeted core components such as weight, hypertension, lipid management, diabetes, heart failure and other comorbidities.;Nutrition handout(s) given to patient.    Expected Outcomes Short Term Goal: Understand basic principles of dietary content, such as calories, fat, sodium, cholesterol and nutrients.;Long Term Goal: Adherence to prescribed nutrition plan.          Nutrition Assessments:  MEDIFICTS Score Key: >=70 Need to make dietary changes  40-70 Heart Healthy Diet <= 40 Therapeutic Level Cholesterol Diet  Flowsheet Row CARDIAC REHAB PHASE II ORIENTATION from 03/21/2024 in [facility] CARDIAC REHABILITATION  Picture Your Plate  Total Score on Admission 51   Picture Your Plate Scores: <59 Unhealthy dietary pattern with much room for improvement. 41-50 Dietary pattern unlikely to meet recommendations for good health and room for improvement. 51-60 More healthful dietary pattern, with some room for improvement.  >60 Healthy dietary pattern, although there may be some specific behaviors that could be improved.    Nutrition Goals Re-Evaluation:   Nutrition Goals Discharge (Final Nutrition Goals Re-Evaluation):   Psychosocial: Target Goals: Acknowledge presence or absence of significant depression and/or stress, maximize coping skills, provide positive support  system. Participant is able to verbalize types and ability to use techniques and skills needed for reducing stress and depression.  Initial Review & Psychosocial Screening:  Initial Psych Review & Screening - 03/14/24 1405       Initial Review   Current issues with None Identified      Family Dynamics   Good Support System? Yes    Comments Patient's wife is his main support.      Barriers   Psychosocial barriers to participate in program The patient should benefit from training in stress management and relaxation.;Psychosocial barriers identified (see note)      Screening Interventions   Interventions Provide feedback about the scores to participant;Encouraged to exercise    Expected Outcomes Short Term goal: Utilizing psychosocial counselor, staff and physician to assist with identification of specific Stressors or current issues interfering with healing process. Setting desired goal for each stressor or current issue identified.;Long Term Goal: Stressors or current issues are controlled or eliminated.;Short Term goal: Identification and review with participant of any Quality of Life or Depression concerns found by scoring the questionnaire.;Long Term goal: The participant improves quality of Life and PHQ9 Scores as seen by post scores and/or  verbalization of changes          Quality of Life Scores:  Quality of Life - 03/21/24 1607       Quality of Life   Select Quality of Life      Quality of Life Scores   Health/Function Pre 23.46 %    Socioeconomic Pre 25.5 %    Psych/Spiritual Pre 29.64 %    Family Pre 26.4 %    GLOBAL Pre 25.66 %         Scores of 19 and below usually indicate a poorer quality of life in these areas.  A difference of  2-3 points is a clinically meaningful difference.  A difference of 2-3 points in the total score of the Quality of Life Index has been associated with significant improvement in overall quality of life, self-image, physical symptoms, and general health in studies assessing change in quality of life.  PHQ-9: Review Flowsheet       03/21/2024  Depression screen PHQ 2/9  Decreased Interest 0  Down, Depressed, Hopeless 0  PHQ - 2 Score 0  Altered sleeping 0  Tired, decreased energy 2  Change in appetite 0  Feeling bad or failure about yourself  0  Trouble concentrating 0  Moving slowly or fidgety/restless 2  Suicidal thoughts 0  PHQ-9 Score 4  Difficult doing work/chores Somewhat difficult   Interpretation of Total Score  Total Score Depression Severity:  1-4 = Minimal depression, 5-9 = Mild depression, 10-14 = Moderate depression, 15-19 = Moderately severe depression, 20-27 = Severe depression   Psychosocial Evaluation and Intervention:  Psychosocial Evaluation - 03/14/24 1406       Psychosocial Evaluation & Interventions   Interventions Stress management education;Relaxation education;Encouraged to exercise with the program and follow exercise prescription    Comments Patient was referred to cardiac rehab with stent placement 03/09/24. I spoke with his wife. He says she does better with answering questions. She denies any depression or anxiety. She reports he sleeps okay at night. He is retired but still remains active. He enjoys working with wood. She says he  continues to have some bleeding from his catheterization site/right wrist and would like to follow up with Dr. Alvan on 12/5 before he starts the program. She is good with proceeding  with his orientation visit 12/1. His goals for the program are to improve his mobility overall; improve his balance and walking. He uses a cane or a walker for ambulation. He has vertigo at times and he uses a walker with these episodes. He has no barriers identified to complete the program.    Expected Outcomes Short Term: Patient will start the program and attend consistently. Long Term: Patient will complete the program meeting personal goals.    Continue Psychosocial Services  Follow up required by staff          Psychosocial Re-Evaluation:   Psychosocial Discharge (Final Psychosocial Re-Evaluation):   Vocational Rehabilitation: Provide vocational rehab assistance to qualifying candidates.   Vocational Rehab Evaluation & Intervention:  Vocational Rehab - 03/14/24 1405       Initial Vocational Rehab Evaluation & Intervention   Assessment shows need for Vocational Rehabilitation No      Vocational Rehab Re-Evaulation   Comments Retired.          Education: Education Goals: Education classes will be provided on a weekly basis, covering required topics. Participant will state understanding/return demonstration of topics presented.  Learning Barriers/Preferences:  Learning Barriers/Preferences - 03/14/24 1405       Learning Barriers/Preferences   Learning Barriers None    Learning Preferences Skilled Demonstration;Computer/Internet          Education Topics: Hypertension, Hypertension Reduction -Define heart disease and high blood pressure. Discus how high blood pressure affects the body and ways to reduce high blood pressure.   Exercise and Your Heart -Discuss why it is important to exercise, the FITT principles of exercise, normal and abnormal responses to exercise, and how to  exercise safely.   Angina -Discuss definition of angina, causes of angina, treatment of angina, and how to decrease risk of having angina.   Cardiac Medications -Review what the following cardiac medications are used for, how they affect the body, and side effects that may occur when taking the medications.  Medications include Aspirin , Beta blockers, calcium  channel blockers, ACE Inhibitors, angiotensin receptor blockers, diuretics, digoxin, and antihyperlipidemics.   Congestive Heart Failure -Discuss the definition of CHF, how to live with CHF, the signs and symptoms of CHF, and how keep track of weight and sodium intake.   Heart Disease and Intimacy -Discus the effect sexual activity has on the heart, how changes occur during intimacy as we age, and safety during sexual activity.   Smoking Cessation / COPD -Discuss different methods to quit smoking, the health benefits of quitting smoking, and the definition of COPD.   Nutrition I: Fats -Discuss the types of cholesterol, what cholesterol does to the heart, and how cholesterol levels can be controlled.   Nutrition II: Labels -Discuss the different components of food labels and how to read food label   Heart Parts/Heart Disease and PAD -Discuss the anatomy of the heart, the pathway of blood circulation through the heart, and these are affected by heart disease.   Stress I: Signs and Symptoms -Discuss the causes of stress, how stress may lead to anxiety and depression, and ways to limit stress.   Stress II: Relaxation -Discuss different types of relaxation techniques to limit stress.   Warning Signs of Stroke / TIA -Discuss definition of a stroke, what the signs and symptoms are of a stroke, and how to identify when someone is having stroke.   Knowledge Questionnaire Score:  Knowledge Questionnaire Score - 03/21/24 1608       Knowledge Questionnaire Score  Pre Score 21/26          Core Components/Risk  Factors/Patient Goals at Admission:  Personal Goals and Risk Factors at Admission - 03/21/24 1608       Core Components/Risk Factors/Patient Goals on Admission    Weight Management Yes;Weight Maintenance;Weight Loss    Intervention Weight Management: Develop a combined nutrition and exercise program designed to reach desired caloric intake, while maintaining appropriate intake of nutrient and fiber, sodium and fats, and appropriate energy expenditure required for the weight goal.;Weight Management: Provide education and appropriate resources to help participant work on and attain dietary goals.    Admit Weight 197 lb 3.2 oz (89.4 kg)    Goal Weight: Short Term 195 lb (88.5 kg)    Goal Weight: Long Term 193 lb (87.5 kg)    Expected Outcomes Short Term: Continue to assess and modify interventions until short term weight is achieved;Long Term: Adherence to nutrition and physical activity/exercise program aimed toward attainment of established weight goal;Weight Maintenance: Understanding of the daily nutrition guidelines, which includes 25-35% calories from fat, 7% or less cal from saturated fats, less than 200mg  cholesterol, less than 1.5gm of sodium, & 5 or more servings of fruits and vegetables daily;Weight Loss: Understanding of general recommendations for a balanced deficit meal plan, which promotes 1-2 lb weight loss per week and includes a negative energy balance of [phone] kcal/d;Understanding recommendations for meals to include 15-35% energy as protein, 25-35% energy from fat, 35-60% energy from carbohydrates, less than 200mg  of dietary cholesterol, 20-35 gm of total fiber daily;Understanding of distribution of calorie intake throughout the day with the consumption of 4-5 meals/snacks    Improve shortness of breath with ADL's Yes    Intervention Provide education, individualized exercise plan and daily activity instruction to help decrease symptoms of SOB with activities of daily living.     Expected Outcomes Short Term: Improve cardiorespiratory fitness to achieve a reduction of symptoms when performing ADLs;Long Term: Be able to perform more ADLs without symptoms or delay the onset of symptoms    Diabetes Yes    Intervention Provide education about signs/symptoms and action to take for hypo/hyperglycemia.;Provide education about proper nutrition, including hydration, and aerobic/resistive exercise prescription along with prescribed medications to achieve blood glucose in normal ranges: Fasting glucose 65-99 mg/dL    Expected Outcomes Short Term: Participant verbalizes understanding of the signs/symptoms and immediate care of hyper/hypoglycemia, proper foot care and importance of medication, aerobic/resistive exercise and nutrition plan for blood glucose control.;Long Term: Attainment of HbA1C < 7%.    Hypertension Yes    Intervention Provide education on lifestyle modifcations including regular physical activity/exercise, weight management, moderate sodium restriction and increased consumption of fresh fruit, vegetables, and low fat dairy, alcohol moderation, and smoking cessation.;Monitor prescription use compliance.    Expected Outcomes Short Term: Continued assessment and intervention until BP is < 140/71mm HG in hypertensive participants. < 130/11mm HG in hypertensive participants with diabetes, heart failure or chronic kidney disease.;Long Term: Maintenance of blood pressure at goal levels.    Lipids Yes    Intervention Provide education and support for participant on nutrition & aerobic/resistive exercise along with prescribed medications to achieve LDL 70mg , HDL >40mg .    Expected Outcomes Short Term: Participant states understanding of desired cholesterol values and is compliant with medications prescribed. Participant is following exercise prescription and nutrition guidelines.;Long Term: Cholesterol controlled with medications as prescribed, with individualized exercise RX and with  personalized nutrition plan. Value goals: LDL < 70mg , HDL >  40 mg.          Core Components/Risk Factors/Patient Goals Review:    Core Components/Risk Factors/Patient Goals at Discharge (Final Review):    ITP Comments:  ITP Comments     Row Name 03/14/24 1413 03/21/24 1539 03/22/24 1011       ITP Comments Virtual orientation visit completed for cardiac rehab with status post coronary artery stent placement. On-site orientation visit scheduled for Monday 03/21/24. Documentation 1 pm. Patient attend orientation today.  Patient is attending Cardiac Rehabilitation Program.  Documentation for diagnosis can be found in [facility] 03/09/24.  Reviewed medical chart, RPE/RPD, gym safety, and program guidelines.  Patient was fitted to equipment they will be using during rehab.  Patient is scheduled to start exercise on Wednesday 03/30/24 at 1430.   Initial ITP created and sent for review and signature by Dr. Dorn Ross, Medical Director for Cardiac Rehabilitation Program. 30 day review completed. ITP sent to Dr. Dorn Ross, Medical Director of Cardiac Rehab. Continue with ITP unless changes are made by physician.  Only oriented yesterday        Comments: 30 day review

## 2024-03-25 ENCOUNTER — Encounter: Payer: Self-pay | Admitting: Cardiology

## 2024-03-25 ENCOUNTER — Ambulatory Visit: Payer: Self-pay | Attending: Cardiology | Admitting: Cardiology

## 2024-03-25 VITALS — BP 134/66 | HR 77 | Ht 69.0 in | Wt 199.0 lb

## 2024-03-25 DIAGNOSIS — E782 Mixed hyperlipidemia: Secondary | ICD-10-CM

## 2024-03-25 DIAGNOSIS — I10 Essential (primary) hypertension: Secondary | ICD-10-CM | POA: Diagnosis not present

## 2024-03-25 DIAGNOSIS — R0602 Shortness of breath: Secondary | ICD-10-CM | POA: Diagnosis not present

## 2024-03-25 DIAGNOSIS — I2510 Atherosclerotic heart disease of native coronary artery without angina pectoris: Secondary | ICD-10-CM | POA: Diagnosis not present

## 2024-03-25 NOTE — Progress Notes (Signed)
 Clinical Summary Mr. Martin is a 78 y.o.male seen today for follow up of the following medical problems.   1. CAD/SOB -12/2023 and had recently underwent a nuclear stress test at [facility] which had shown a small, moderate in severity reversible defect along the basal inferior and mid inferior septal segment which could be due to possible attenuation artifact but could not rule out ischemia  02/2024 coronary CTA: mod prox to mid LAD and RCA disease, mild LCX disease. FFR suggesting possible significance in the prox to mid LAD  10/2022 echo [facility]: LVEF 65-70%, grade I dd - 03/09/24 cath: mid LAD 75%, mid RCA 40%, DES to LAD LVEDP 11. - participating in cardiac rehab - no chest pain, breathing not improved - cardiac rehab 12/2 with exercise O2 sat down 89%.   - compliant with meds  2. HTN - compliant with meds   3. Hyperlipidemia - recent labs with pcp - 02/2024 TC 151 TG 76 HDL 56 LDL 80. Based on this changed from simva to crestor  20mg  daily.    4. LE edema - taking lasix  20mg  daily, not controlling swelling.  - LVEDP was normal at cath. Taking lasix  prn, takes roughly daily.  Past Medical History:  Diagnosis Date   Allergy    Arthritis    Diabetes mellitus    Hypercholesterolemia    Hypertension    Hypothyroidism    Prostate cancer (HCC)    hx of   Thyroid  disease      Allergies  Allergen Reactions   Bee Venom Hives and Swelling    Yellow Jacket   Penicillins Other (See Comments)    Did it involve swelling of the face/tongue/throat, SOB, or low BP? Unknown Did it involve sudden or severe rash/hives, skin peeling, or any reaction on the inside of your mouth or nose? Unknown Did you need to seek medical attention at a hospital or doctor's office? Unknown When did it last happen?   Over 10 years    If all above answers are NO, may proceed with cephalosporin use.    Oxycodone Hcl Itching     Current Outpatient Medications  Medication Sig Dispense  Refill   aspirin  81 MG tablet Take 81 mg by mouth every evening.     Cholecalciferol  (VITAMIN D3) 1000 UNITS CAPS Take 1 capsule by mouth every evening.     clopidogrel  (PLAVIX ) 75 MG tablet Take 1 tablet (75 mg total) by mouth daily with breakfast. 90 tablet 3   furosemide  (LASIX ) 20 MG tablet TAKE 2 TABLETS BY MOUTH AS NEEDED FOR EDEMA (SWELLING) **THIS IS A DOSE INCREASE** 60 tablet 6   glipiZIDE  (GLUCOTROL  XL) 2.5 MG 24 hr tablet Take 2.5 mg by mouth daily.     levothyroxine  (SYNTHROID ) 88 MCG tablet Take 1 tablet by mouth once daily 90 tablet 0   loratadine (CLARITIN) 10 MG tablet Take 10 mg by mouth daily as needed for allergies.     losartan  (COZAAR ) 25 MG tablet Take 25 mg by mouth daily.      meclizine  (ANTIVERT ) 25 MG tablet Take 1 tablet (25 mg total) by mouth 3 (three) times daily as needed for dizziness. May cause drowsiness 30 tablet 0   metFORMIN  (GLUCOPHAGE ) 500 MG tablet Take 2 tablets (1,000 mg total) by mouth 2 (two) times daily.     metoprolol  succinate (TOPROL -XL) 25 MG 24 hr tablet Take 1 tablet by mouth once daily 90 tablet 3   nitroGLYCERIN  (NITROSTAT ) 0.4 MG  SL tablet Place 1 tablet (0.4 mg total) under the tongue every 5 (five) minutes as needed. 25 tablet 3   Propylene Glycol (SYSTANE COMPLETE OP) Apply 1 drop to eye daily.     rOPINIRole (REQUIP) 0.5 MG tablet Take by mouth.     rosuvastatin  (CRESTOR ) 20 MG tablet Take 1 tablet (20 mg total) by mouth daily. 90 tablet 3   Turmeric 500 MG TABS Take 500 mg by mouth every evening.     White Petrolatum-Mineral Oil (SYSTANE NIGHTTIME) OINT Apply 1 Application to eye at bedtime as needed (dry eye).     zinc gluconate 50 MG tablet Take 50 mg by mouth daily.     No current facility-administered medications for this visit.     Past Surgical History:  Procedure Laterality Date   ANKLE SURGERY     BACK SURGERY     CORONARY PRESSURE/FFR WITH 3D MAPPING N/A 03/09/2024   Procedure: Coronary Pressure/FFR w/3D Mapping;   Surgeon: Jordan, Peter M, MD;  Location: [facility] INVASIVE CV LAB;  Service: Cardiovascular;  Laterality: N/A;   CORONARY STENT INTERVENTION N/A 03/09/2024   Procedure: CORONARY STENT INTERVENTION;  Surgeon: Jordan, Peter M, MD;  Location: [facility] INVASIVE CV LAB;  Service: Cardiovascular;  Laterality: N/A;   HERNIA REPAIR     LEFT HEART CATH AND CORONARY ANGIOGRAPHY N/A 03/09/2024   Procedure: LEFT HEART CATH AND CORONARY ANGIOGRAPHY;  Surgeon: Jordan, Peter M, MD;  Location: [facility] INVASIVE CV LAB;  Service: Cardiovascular;  Laterality: N/A;   PROSTATECTOMY       Allergies  Allergen Reactions   Bee Venom Hives and Swelling    Yellow Jacket   Penicillins Other (See Comments)    Did it involve swelling of the face/tongue/throat, SOB, or low BP? Unknown Did it involve sudden or severe rash/hives, skin peeling, or any reaction on the inside of your mouth or nose? Unknown Did you need to seek medical attention at a hospital or doctor's office? Unknown When did it last happen?   Over 10 years    If all above answers are NO, may proceed with cephalosporin use.    Oxycodone Hcl Itching      Family History  Problem Relation Age of Onset   Cancer Other        FH of Lung Cancer   Cancer Other        FH of Prostate Cancer   Diabetes Other        Parent   Heart disease Other        Parent     Social History Mr. Crookshanks reports that he has quit smoking. He has never used smokeless tobacco. Mr. Westfall reports no history of alcohol use.     Physical Examination Today's Vitals   03/25/24 0850  BP: 134/66  Pulse: 77  SpO2: 95%  Weight: 199 lb (90.3 kg)  Height: 5' 9 (1.753 m)   Body mass index is 29.39 kg/m.  Gen: resting comfortably, no acute distress HEENT: no scleral icterus, pupils equal round and reactive, no palptable cervical adenopathy,  CV: RRR, no mrg, no jvd Resp: Clear to auscultation bilaterally GI: abdomen is soft, non-tender, non-distended, normal bowel sounds, no  hepatosplenomegaly MSK: extremities are warm, no edema.  Skin: warm, no rash Neuro:  no focal deficits Psych: appropriate affect   Diagnostic Studies 09/2019 echo IMPRESSIONS     1. Left ventricular ejection fraction, by estimation, is 60 to 65%. The  left ventricle has normal function. The left  ventricle has no regional  wall motion abnormalities. Left ventricular diastolic parameters are  indeterminate.   2. Right ventricular systolic function is normal. The right ventricular  size is normal. There is normal pulmonary artery systolic pressure.   3. The mitral valve is normal in structure. No evidence of mitral valve  regurgitation. No evidence of mitral stenosis.   4. The aortic valve is tricuspid. Aortic valve regurgitation is not  visualized. No aortic stenosis is present.   5. The inferior vena cava is normal in size with greater than 50%  respiratory variability, suggesting right atrial pressure of 3 mmHg.     Assessment and Plan   1.CAD - recent stent as reported above - no chest pains. His chronic SOB/DOE is not improve - in cardiac rehab sats down to 89% with ambulation, will refer to pulmonary for evaluatoin  2. SOB - ongoing symptoms. Prior smoker, environmentl [place] - extensive cardiac workup as listed above, recent stent. Symptoms not improved - ambulatory sats to 89% in cardiac rehab, refer to pulmonary for evaluation.    3. HTN -bp at goal, continue current meds   4. Hyperlipidemia - above goal based on last panel, at that time changed to crestor  20mg  daily - continue current therapy, f/u repeat labs   5. LE edema - echos havbeen overall benign, recent normal LVEDP by cath. Likely edema more related to venous insufficiency   EKG today shows NSR, no ischemic changes.      F/u 4 months     Dorn PHEBE Ross, M.D.

## 2024-03-25 NOTE — Patient Instructions (Addendum)
 Medication Instructions:  Your physician recommends that you continue on your current medications as directed. Please refer to the Current Medication list given to you today.   Labwork: None today  Testing/Procedures: None today  Follow-Up: 4 months with Dr.Branch or B.Strader,PA-C, S.Dunlap,PA-C, or E.Peck,NP  Any Other Special Instructions Will Be Listed Below (If Applicable).   Referral to Pulmonary medicine. They will call you to schedule an appointment.  If you need a refill on your cardiac medications before your next appointment, please call your pharmacy.

## 2024-03-30 ENCOUNTER — Encounter (HOSPITAL_COMMUNITY): Admission: RE | Admit: 2024-03-30 | Discharge: 2024-03-30 | Payer: Self-pay | Attending: Cardiology

## 2024-03-30 ENCOUNTER — Other Ambulatory Visit (HOSPITAL_BASED_OUTPATIENT_CLINIC_OR_DEPARTMENT_OTHER): Payer: Self-pay

## 2024-03-30 ENCOUNTER — Telehealth: Payer: Self-pay | Admitting: Cardiology

## 2024-03-30 DIAGNOSIS — Z955 Presence of coronary angioplasty implant and graft: Secondary | ICD-10-CM | POA: Diagnosis not present

## 2024-03-30 LAB — GLUCOSE, CAPILLARY
Glucose-Capillary: 101 mg/dL — ABNORMAL HIGH (ref 70–99)
Glucose-Capillary: 136 mg/dL — ABNORMAL HIGH (ref 70–99)
Glucose-Capillary: 133 mg/dL — ABNORMAL HIGH (ref 70–99)

## 2024-03-30 NOTE — Telephone Encounter (Signed)
° °  He just saw Dr. Alvan on 03/25/2024 so not sure if this was addressed at that time or not but he should be fine to have steroid injections as long as they do not have to hold his Aspirin  or Plavix . If having to hold Aspirin  or Plavix , injections would need to be delayed as he just had stent placement last month. Therefore, would make sure they inform Orthopedics that he is on the medications.   Signed, Laymon CHRISTELLA Qua, PA-C 03/30/2024, 7:54 PM Pager: [phone]

## 2024-03-30 NOTE — Progress Notes (Signed)
 Daily Session Note  Patient Details  Name: Duane Stevens MRN: [id number] Date of Birth: 1946-02-17 Referring Provider:   Flowsheet Row CARDIAC REHAB PHASE II ORIENTATION from 03/21/2024 in [facility] CARDIAC REHABILITATION  Referring Provider Alvan Carrier MD    Encounter Date: 03/30/2024  Check In:  Session Check In - 03/30/24 1420       Check-In   Supervising physician immediately available to respond to emergencies See telemetry face sheet for immediately available MD    Location [facility]    Staff Present Laymon Rattler, BSN, RN, Oddis Louder, RN, BSN;Anyjah Roundtree BSN, RN;Heather Con, BS, Exercise Physiologist    Virtual Visit No    Medication changes reported     No    Fall or balance concerns reported    No    Tobacco Cessation No Change    Warm-up and Cool-down Performed on first and last piece of equipment    Resistance Training Performed Yes    VAD Patient? No    PAD/SET Patient? No      Pain Assessment   Currently in Pain? No/denies    Pain Score 0-No pain    Multiple Pain Sites No          Capillary Blood Glucose: Results for orders placed or performed during the hospital encounter of 03/30/24 (from the past 24 hours)  Glucose, capillary     Status: Abnormal   Collection Time: 03/30/24  2:15 PM  Result Value Ref Range   Glucose-Capillary 101 (H) 70 - 99 mg/dL      Social History   Tobacco Use  Smoking Status Former  Smokeless Tobacco Never  Tobacco Comments   quit 20-30 years ago    Goals Met:  Independence with exercise equipment Exercise tolerated well No report of concerns or symptoms today Strength training completed today  Goals Unmet:  Not Applicable  Comments: [name] full day of exercise!  Patient was oriented to gym and equipment including functions, settings, policies, and procedures.  Patient's individual exercise prescription and treatment plan were reviewed.  All starting workloads were  established based on the results of the 6 minute walk test done at initial orientation visit.  The plan for exercise progression was also introduced and progression will be customized based on patient's performance and goals.

## 2024-03-30 NOTE — Telephone Encounter (Signed)
 Pts wife came into the office and wants to know if it is ok for the pt to have steroid shots in his knees on Friday. Pt's wife cannot remember the doctor or the place that is doing it.  [phone] is the best number to reach someone.

## 2024-03-31 ENCOUNTER — Other Ambulatory Visit: Payer: Self-pay

## 2024-03-31 ENCOUNTER — Encounter: Payer: Self-pay | Admitting: Internal Medicine

## 2024-03-31 ENCOUNTER — Other Ambulatory Visit (HOSPITAL_COMMUNITY): Payer: Self-pay

## 2024-03-31 ENCOUNTER — Ambulatory Visit: Admitting: Internal Medicine

## 2024-03-31 VITALS — BP 158/73 | HR 65 | Ht 69.0 in | Wt 197.8 lb

## 2024-03-31 DIAGNOSIS — R0609 Other forms of dyspnea: Secondary | ICD-10-CM | POA: Diagnosis not present

## 2024-03-31 DIAGNOSIS — Z87891 Personal history of nicotine dependence: Secondary | ICD-10-CM | POA: Diagnosis not present

## 2024-03-31 MED ORDER — BREZTRI AEROSPHERE 160-9-4.8 MCG/ACT IN AERO: 2.0000 | INHALATION_SPRAY | Freq: Two times a day (BID) | RESPIRATORY_TRACT | Status: AC

## 2024-03-31 MED FILL — Metformin HCl Tab 500 MG: 1000.0000 mg | ORAL | 100 days supply | Qty: 400 | Fill #0 | Status: AC

## 2024-03-31 MED FILL — Simvastatin Tab 20 MG: 20.0000 mg | ORAL | 100 days supply | Qty: 100 | Fill #0 | Status: AC

## 2024-03-31 NOTE — Telephone Encounter (Signed)
 Left message for pt/pt spouse to call office back regarding recommendations made by provider.

## 2024-03-31 NOTE — Assessment & Plan Note (Addendum)
 Quit smoking 1985  - PFTs /42023 for doe with any exertion   minimal obst but 0.20 cc better p saba  - 03/31/2024  After extensive coaching inhaler device,  effectiveness =    75% from a baseline of 25% hfa > try breztri  sample but just 2 puffs each am  - 03/31/2024   Walked on RA  x  3  lap(s) =  approx 450  ft  @ slow  pace, stopped due to knee pain  with lowest 02 sats 94% - .Allergy screen 03/31/2024 >  Eos 0.2 /  IgE  285 - PFTs ordered 03/31/2024 >>>   When respiratory symptoms begin or become refractory well after a patient reports complete smoking cessation,  Especially when this wasn't the case while they were smoking, a red flag is raised based on the work of Dr Genette which states:  if you quit smoking when your best day FEV1 is still well preserved it is highly unlikely you will progress to severe disease.  That is to say, once the smoking stops,  the symptoms should not suddenly erupt or markedly worsen.  If so, the differential diagnosis should include  obesity/deconditioning,  LPR/Reflux/Aspiration syndromes,  occult CHF, or  especially side effect of medications commonly used in this population (none of the usual suspects listed.  The elevated IgE also raises the possibility of allergy/asthma though at age 71 likely not newly acquired asthma though may need allergy eval for chronic rhinitis to follow.  Rec Continue daily walks to wood shed to judge response to breztri ,  with new set of pfts when available

## 2024-03-31 NOTE — Patient Instructions (Addendum)
 Breztri  Take 2 puffs first thing in am   Work on inhaler technique:  relax and gently blow all the way out then take a nice smooth full deep breath back in, triggering the inhaler at same time you start breathing in.  Hold breath in for at least  5 seconds if you can. Blow out breztri  thru nose. Rinse and gargle with water  when done.  If mouth or throat bother you at all,  try brushing teeth/gums/tongue with arm and hammer toothpaste/ make a slurry and gargle and spit out.    If you notice improvement from breztri  we can call it in for you or wait until you return in 6 weeks to decide better choice if not much better  Please remember to go to the lab department   for your tests - we will call you with the results when they are available.      Please schedule a follow up office visit in 6 weeks, call sooner if needed  PFT's on return   Add:  consider allergy eval for rhinitis with elevated IgE

## 2024-03-31 NOTE — Telephone Encounter (Signed)
 Pt wife returning call. She says it is okay to leave info on VM. Please advise.

## 2024-03-31 NOTE — Progress Notes (Addendum)
 Duane Stevens, male    DOB: 1946/01/17    MRN: [id number]   Brief patient profile:  75  yowm  with chronic sinusitis quit smoking around 1984 and quit Duane Stevens exp 2007  referred to pulmonary clinic in [place]  03/31/2024 by Duane Stevens  for doe x  2023  but knees are also limiting   Pt not previously seen by PCCM service.     History of Present Illness  03/31/2024  Pulmonary/ 1st office eval/ Duane Stevens / [facility]  Chief Complaint  Patient presents with   Establish Care    DOE   Dyspnea:  100 ft slt uphill cto shed where he has wood stove  Cough: none / some pnds all his life some better with clariton. Sleep: blocks and one pillow  SABA use: none  02: none     No obvious day to day or daytime pattern/variability or assoc   mucus plugs or hemoptysis or cp or chest tightness, subjective wheeze or overt  hb symptoms.    Also denies any obvious fluctuation of symptoms with weather or environmental changes or other aggravating or alleviating factors except as outlined above   No unusual exposure hx or h/o childhood pna/ asthma or knowledge of premature birth.  Current Allergies, Complete Past Medical History, Past Surgical History, Family History, and Social History were reviewed in Duane Stevens record.  ROS  The following are not active complaints unless bolded Hoarseness, sore throat, dysphagia, dental problems, itching, sneezing,  nasal congestion or discharge of excess mucus or purulent secretions, ear ache,   fever, chills, sweats, unintended wt loss or wt gain, classically pleuritic or exertional cp,  orthopnea pnd or arm/hand swelling  or leg swelling, presyncope, palpitations, abdominal pain, anorexia, nausea, vomiting, diarrhea  or change in bowel habits or change in bladder habits, change in stools or change in urine, dysuria, hematuria,  rash, arthralgias, visual complaints, headache, numbness, weakness or ataxia or problems with walking or  coordination,  change in mood or  memory.            Outpatient Medications Prior to Visit  Medication Sig Dispense Refill   aspirin  81 MG tablet Take 81 mg by mouth every evening.     Cholecalciferol  (VITAMIN D3) 1000 UNITS CAPS Take 1 capsule by mouth every evening.     clopidogrel  (PLAVIX ) 75 MG tablet Take 1 tablet (75 mg total) by mouth daily with breakfast. 90 tablet 3   furosemide  (LASIX ) 20 MG tablet TAKE 2 TABLETS BY MOUTH AS NEEDED FOR EDEMA (SWELLING) **THIS IS A DOSE INCREASE** 60 tablet 6   glipiZIDE  (GLUCOTROL  XL) 2.5 MG 24 hr tablet Take 2.5 mg by mouth daily.     levothyroxine  (SYNTHROID ) 88 MCG tablet Take 1 tablet by mouth once daily 90 tablet 0   loratadine (CLARITIN) 10 MG tablet Take 10 mg by mouth daily as needed for allergies.     losartan  (COZAAR ) 25 MG tablet Take 25 mg by mouth daily.      meclizine  (ANTIVERT ) 25 MG tablet Take 1 tablet (25 mg total) by mouth 3 (three) times daily as needed for dizziness. May cause drowsiness 30 tablet 0   metFORMIN  (GLUCOPHAGE ) 500 MG tablet Take 2 tablets (1,000 mg total) by mouth 2 (two) times daily.     metoprolol  succinate (TOPROL -XL) 25 MG 24 hr tablet Take 1 tablet by mouth once daily 90 tablet 3   nitroGLYCERIN  (NITROSTAT ) 0.4 MG SL tablet Place 1 tablet (  0.4 mg total) under the tongue every 5 (five) minutes as needed. 25 tablet 3   Propylene Glycol (SYSTANE COMPLETE OP) Apply 1 drop to eye daily.     rOPINIRole (REQUIP) 0.5 MG tablet Take by mouth.     rosuvastatin  (CRESTOR ) 20 MG tablet Take 1 tablet (20 mg total) by mouth daily. 90 tablet 3   Turmeric 500 MG TABS Take 500 mg by mouth every evening.     White Petrolatum-Mineral Oil (SYSTANE NIGHTTIME) OINT Apply 1 Application to eye at bedtime as needed (dry eye).     zinc gluconate 50 MG tablet Take 50 mg by mouth daily. (Patient not taking: Reported on 03/31/2024)     No facility-administered medications prior to visit.    Past Medical History:  Diagnosis Date    Allergy    Arthritis    Diabetes mellitus    Hypercholesterolemia    Hypertension    Hypothyroidism    Prostate cancer (HCC)    hx of   Thyroid  disease       Objective:     BP (!) 158/73   Pulse 65   Ht 5' 9 (1.753 m)   Wt 197 lb 12.8 oz (89.7 kg)   SpO2 97% Comment: ra  BMI 29.21 kg/m   SpO2: 97 % (ra) pleasant amb wm nad       HEENT : Oropharynx  clear/ edentulous   Nasal turbinates mod edema    NECK :  without  apparent JVD/ palpable Nodes/TM    LUNGS: no acc muscle use,  Min barrel  contour chest wall with bilateral trace end exp wheeze and  without cough on insp or exp maneuvers and min  Hyperresonant  to  percussion bilaterally    CV:  RRR  no s3 or murmur or increase in P2, and no edema   ABD:  soft and nontender    MS:  Nl gait/ ext warm without deformities Or obvious joint restrictions  calf tenderness, cyanosis or clubbing     SKIN: warm and dry without lesions    NEURO:  alert, approp, nl sensorium with  no motor or cerebellar deficits apparent.          I personally reviewed images and agree with radiology impression as follows:   Chest CT coronary    03/01/24 Subsegmental atelectasis/scarring in the lower lobes. The previously described pulmonary nodules are not definitively seen on the current exam. Minimal subpleural scarring in the anterior right middle lobe. No pleural fluid. The included airways are patent.        Labs ordered/ reviewed:     Chemistry      Component Value Date/Time   NA 142 03/31/2024 0951   K 4.7 03/31/2024 0951   CL 101 03/31/2024 0951   CO2 25 03/31/2024 0951   BUN 10 03/31/2024 0951   CREATININE 0.92 03/31/2024 0951      Component Value Date/Time   CALCIUM  9.5 03/31/2024 0951   ALKPHOS 86 07/15/2018 1607   AST 14 (L) 07/15/2018 1607   ALT 10 07/15/2018 1607   BILITOT 0.5 07/15/2018 1607        Lab Results  Component Value Date   WBC 6.9 03/31/2024   HGB 14.0 03/31/2024   HCT 41.1 03/31/2024    MCV 89 03/31/2024   PLT 203 03/31/2024       EOS  285                                   03/31/2024       Lab Results  Component Value Date   TSH 1.090 03/31/2024     IgE   03/31/2024         285    Assessment   Assessment & Plan DOE (dyspnea on exertion) Quit smoking 1985  - PFTs /42023 for doe with any exertion   minimal obst but 0.20 cc better p saba  - 03/31/2024  After extensive coaching inhaler device,  effectiveness =    75% from a baseline of 25% hfa > try breztri  sample but just 2 puffs each am  - 03/31/2024   Walked on RA  x  3  lap(s) =  approx 450  ft  @ slow  pace, stopped due to knee pain  with lowest 02 sats 94% - .Allergy screen 03/31/2024 >  Eos 0.2 /  IgE  285 - PFTs ordered 03/31/2024 >>>   When respiratory symptoms begin or become refractory well after a patient reports complete smoking cessation,  Especially when this wasn't the case while they were smoking, a red flag is raised based on the work of Dr Genette which states:  if you quit smoking when your best day FEV1 is still well preserved it is highly unlikely you will progress to severe disease.  That is to say, once the smoking stops,  the symptoms should not suddenly erupt or markedly worsen.  If so, the differential diagnosis should include  obesity/deconditioning,  LPR/Reflux/Aspiration syndromes,  occult CHF, or  especially side effect of medications commonly used in this population (none of the usual suspects listed.  The elevated IgE also raises the possibility of allergy/asthma though at age 59 likely not newly acquired asthma though may need allergy eval for chronic rhinitis to follow.  Rec Continue daily walks to wood shed to judge response to breztri ,  with new set of pfts when available      Each maintenance medication was reviewed in detail including emphasizing most importantly the difference between maintenance and prns and under what  circumstances the prns are to be triggered using an action plan format where appropriate.  Total time for H and P, chart review, counseling, reviewing hfa device(s) , directly observing portions of ambulatory 02 saturation study/ and generating customized AVS unique to this office visit / same day charting = 51 min new pt eval                AVS  Patient Instructions  Breztri  Take 2 puffs first thing in am   Work on inhaler technique:  relax and gently blow all the way out then take a nice smooth full deep breath back in, triggering the inhaler at same time you start breathing in.  Hold breath in for at least  5 seconds if you can. Blow out breztri  thru nose. Rinse and gargle with water  when done.  If mouth or throat bother you at all,  try brushing teeth/gums/tongue with arm and hammer toothpaste/ make a slurry and gargle and spit out.    If you notice improvement from breztri  we can call it in for you or wait until you return in 6 weeks to decide better choice if not much better  Please remember to go to the lab department   for your  tests - we will call you with the results when they are available.      Please schedule a follow up office visit in 6 weeks, call sooner if needed  PFT's on return   Add:  consider allergy eval for rhinitis with elevated IgE        Ozell America, MD 04/03/2024

## 2024-03-31 NOTE — Telephone Encounter (Signed)
 Patient's wife notified and verbalized understanding.

## 2024-04-01 ENCOUNTER — Encounter (HOSPITAL_COMMUNITY): Admission: RE | Admit: 2024-04-01 | Discharge: 2024-04-01 | Payer: Self-pay | Attending: Cardiology

## 2024-04-01 DIAGNOSIS — Z955 Presence of coronary angioplasty implant and graft: Secondary | ICD-10-CM | POA: Diagnosis not present

## 2024-04-01 LAB — GLUCOSE, CAPILLARY: Glucose-Capillary: 254 mg/dL — ABNORMAL HIGH (ref 70–99)

## 2024-04-01 NOTE — Progress Notes (Signed)
 Daily Session Note  Patient Details  Name: Duane Stevens MRN: [id number] Date of Birth: May 22, 1945 Referring Provider:   Flowsheet Row CARDIAC REHAB PHASE II ORIENTATION from 03/21/2024 in [facility] CARDIAC REHABILITATION  Referring Provider Alvan Carrier MD    Encounter Date: 04/01/2024  Check In:  Session Check In - 04/01/24 1322       Check-In   Supervising physician immediately available to respond to emergencies See telemetry face sheet for immediately available MD    Location [facility]    Staff Present Laymon Rattler, BSN, RN, WTA-C;Victoria Zina, RN;Heather Con, BS, Exercise Physiologist    Virtual Visit No    Medication changes reported     No    Fall or balance concerns reported    No    Tobacco Cessation No Change    Warm-up and Cool-down Performed on first and last piece of equipment    Resistance Training Performed Yes    VAD Patient? No    PAD/SET Patient? No      Pain Assessment   Currently in Pain? No/denies          Capillary Blood Glucose: No results found for this or any previous visit (from the past 24 hours).    Tobacco Use History[1]  Goals Met:  Independence with exercise equipment Exercise tolerated well No report of concerns or symptoms today Strength training completed today  Goals Unmet:  Not Applicable  Comments: Pt able to follow exercise prescription today without complaint.  Will continue to monitor for progression.        [1]  Social History Tobacco Use  Smoking Status Former  Smokeless Tobacco Never  Tobacco Comments   quit 20-30 years ago

## 2024-04-02 LAB — CBC WITH DIFFERENTIAL/PLATELET
Basophils Absolute: 0 x10E3/uL (ref 0.0–0.2)
Basos: 1 %
EOS (ABSOLUTE): 0.2 x10E3/uL (ref 0.0–0.4)
Eos: 3 %
Hematocrit: 41.1 % (ref 37.5–51.0)
Hemoglobin: 14 g/dL (ref 13.0–17.7)
Immature Grans (Abs): 0 x10E3/uL (ref 0.0–0.1)
Immature Granulocytes: 0 %
Lymphocytes Absolute: 2 x10E3/uL (ref 0.7–3.1)
Lymphs: 29 %
MCH: 30.3 pg (ref 26.6–33.0)
MCHC: 34.1 g/dL (ref 31.5–35.7)
MCV: 89 fL (ref 79–97)
Monocytes Absolute: 0.7 x10E3/uL (ref 0.1–0.9)
Monocytes: 10 %
Neutrophils Absolute: 3.9 x10E3/uL (ref 1.4–7.0)
Neutrophils: 57 %
Platelets: 203 x10E3/uL (ref 150–450)
RBC: 4.62 x10E6/uL (ref 4.14–5.80)
RDW: 13.2 % (ref 11.6–15.4)
WBC: 6.9 x10E3/uL (ref 3.4–10.8)

## 2024-04-02 LAB — BASIC METABOLIC PANEL WITH GFR
BUN/Creatinine Ratio: 11 (ref 10–24)
BUN: 10 mg/dL (ref 8–27)
CO2: 25 mmol/L (ref 20–29)
Calcium: 9.5 mg/dL (ref 8.6–10.2)
Chloride: 101 mmol/L (ref 96–106)
Creatinine, Ser: 0.92 mg/dL (ref 0.76–1.27)
Glucose: 122 mg/dL — ABNORMAL HIGH (ref 70–99)
Potassium: 4.7 mmol/L (ref 3.5–5.2)
Sodium: 142 mmol/L (ref 134–144)
eGFR: 84 mL/min/1.73 (ref >59)

## 2024-04-02 LAB — TSH: TSH: 1.09 u[IU]/mL (ref 0.450–4.500)

## 2024-04-02 LAB — IGE: IgE (Immunoglobulin E), Serum: 285 [IU]/mL (ref 6–495)

## 2024-04-03 ENCOUNTER — Ambulatory Visit: Payer: Self-pay | Admitting: Internal Medicine

## 2024-04-03 DIAGNOSIS — R0609 Other forms of dyspnea: Secondary | ICD-10-CM

## 2024-04-04 ENCOUNTER — Encounter (HOSPITAL_COMMUNITY): Admission: RE | Admit: 2024-04-04 | Discharge: 2024-04-04 | Payer: Self-pay | Attending: Cardiology

## 2024-04-04 DIAGNOSIS — Z955 Presence of coronary angioplasty implant and graft: Secondary | ICD-10-CM | POA: Diagnosis not present

## 2024-04-04 LAB — GLUCOSE, CAPILLARY: Glucose-Capillary: 143 mg/dL — ABNORMAL HIGH (ref 70–99)

## 2024-04-04 NOTE — Progress Notes (Signed)
 Daily Session Note  Patient Details  Name: Duane Stevens MRN: [id number] Date of Birth: 11/24/1945 Referring Provider:   Flowsheet Row CARDIAC REHAB PHASE II ORIENTATION from 03/21/2024 in [facility] CARDIAC REHABILITATION  Referring Provider Alvan Carrier MD    Encounter Date: 04/04/2024  Check In:  Session Check In - 04/04/24 1436       Check-In   Supervising physician immediately available to respond to emergencies See telemetry face sheet for immediately available MD    Location [facility]    Staff Present Powell Benders, BS, Exercise Physiologist;Ladavia Lindenbaum Zina, RN    Virtual Visit No    Medication changes reported     No    Fall or balance concerns reported    No    Warm-up and Cool-down Performed on first and last piece of equipment    Resistance Training Performed Yes    VAD Patient? No    PAD/SET Patient? No      Pain Assessment   Currently in Pain? No/denies          Capillary Blood Glucose: No results found for this or any previous visit (from the past 24 hours).    Tobacco Use History[1]  Goals Met:  Independence with exercise equipment Exercise tolerated well No report of concerns or symptoms today Strength training completed today  Goals Unmet:  Not Applicable  Comments: Pt able to follow exercise prescription today without complaint.  Will continue to monitor for progression.        [1]  Social History Tobacco Use  Smoking Status Former  Smokeless Tobacco Never  Tobacco Comments   quit 20-30 years ago

## 2024-04-04 NOTE — Progress Notes (Signed)
 Called and relayed results to pt and he stated he could not hear so put his wife otp Duane Stevens in dpr and she confirmed understanding. Pt is interested in the allergy referral

## 2024-04-06 ENCOUNTER — Encounter (HOSPITAL_COMMUNITY): Admission: RE | Admit: 2024-04-06 | Discharge: 2024-04-06 | Payer: Self-pay | Attending: Cardiology

## 2024-04-06 DIAGNOSIS — Z955 Presence of coronary angioplasty implant and graft: Secondary | ICD-10-CM

## 2024-04-06 LAB — GLUCOSE, CAPILLARY
Glucose-Capillary: 206 mg/dL — ABNORMAL HIGH (ref 70–99)
Glucose-Capillary: 186 mg/dL — ABNORMAL HIGH (ref 70–99)

## 2024-04-06 NOTE — Progress Notes (Signed)
 Daily Session Note  Patient Details  Name: Duane Stevens MRN: [id number] Date of Birth: 03-27-1946 Referring Provider:   Flowsheet Row CARDIAC REHAB PHASE II ORIENTATION from 03/21/2024 in [facility] CARDIAC REHABILITATION  Referring Provider Alvan Carrier MD    Encounter Date: 04/06/2024  Check In:  Session Check In - 04/06/24 1423       Check-In   Supervising physician immediately available to respond to emergencies See telemetry face sheet for immediately available MD    Location [facility]    Staff Present Laymon Rattler, BSN, RN, Rosalba Gelineau, MA, RCEP, CCRP, CCET;Yarely [employer], RN    Virtual Visit No    Medication changes reported     No    Fall or balance concerns reported    No    Tobacco Cessation No Change    Warm-up and Cool-down Performed on first and last piece of equipment    Resistance Training Performed Yes    VAD Patient? No    PAD/SET Patient? No      Pain Assessment   Currently in Pain? No/denies    Pain Score 0-No pain    Multiple Pain Sites No          Capillary Blood Glucose: No results found for this or any previous visit (from the past 24 hours).    Tobacco Use History[1]  Goals Met:  Independence with exercise equipment Exercise tolerated well No report of concerns or symptoms today Strength training completed today  Goals Unmet:  Not Applicable  Comments: SABRASABRAPt able to follow exercise prescription today without complaint.  Will continue to monitor for progression.        [1]  Social History Tobacco Use  Smoking Status Former  Smokeless Tobacco Never  Tobacco Comments   quit 20-30 years ago

## 2024-04-07 NOTE — Telephone Encounter (Signed)
 Pt spouse returned requesting to speak with Rosina B please advise

## 2024-04-08 ENCOUNTER — Encounter (HOSPITAL_COMMUNITY): Admission: RE | Admit: 2024-04-08 | Discharge: 2024-04-08 | Payer: Self-pay | Attending: Cardiology

## 2024-04-08 DIAGNOSIS — Z955 Presence of coronary angioplasty implant and graft: Secondary | ICD-10-CM | POA: Diagnosis not present

## 2024-04-08 NOTE — Progress Notes (Signed)
 Daily Session Note  Patient Details  Name: Duane Stevens MRN: [id number] Date of Birth: 10/25/1945 Referring Provider:   Flowsheet Row CARDIAC REHAB PHASE II ORIENTATION from 03/21/2024 in [facility] CARDIAC REHABILITATION  Referring Provider Alvan Carrier MD    Encounter Date: 04/08/2024  Check In:  Session Check In - 04/08/24 1300       Check-In   Supervising physician immediately available to respond to emergencies See telemetry face sheet for immediately available MD    Location [facility]    Staff Present Adrien Louder, RN, BSN;Victoria Zina, RN;Brittany Jackquline, BSN, RN, WTA-C    Virtual Visit No    Medication changes reported     No    Fall or balance concerns reported    No    Warm-up and Cool-down Performed on first and last piece of equipment    Resistance Training Performed Yes    VAD Patient? No    PAD/SET Patient? No      Pain Assessment   Currently in Pain? No/denies    Pain Score 0-No pain    Multiple Pain Sites No          Capillary Blood Glucose: No results found for this or any previous visit (from the past 24 hours).    Tobacco Use History[1]  Goals Met:  Independence with exercise equipment Exercise tolerated well No report of concerns or symptoms today Strength training completed today  Goals Unmet:  Not Applicable  Comments: Pt able to follow exercise prescription today without complaint.  Will continue to monitor for progression.        [1]  Social History Tobacco Use  Smoking Status Former  Smokeless Tobacco Never  Tobacco Comments   quit 20-30 years ago

## 2024-04-11 ENCOUNTER — Encounter (HOSPITAL_COMMUNITY): Admission: RE | Admit: 2024-04-11 | Discharge: 2024-04-11 | Payer: Self-pay | Attending: Cardiology

## 2024-04-11 DIAGNOSIS — Z955 Presence of coronary angioplasty implant and graft: Secondary | ICD-10-CM | POA: Diagnosis not present

## 2024-04-11 NOTE — Progress Notes (Signed)
 Daily Session Note  Patient Details  Name: Duane Stevens MRN: [id number] Date of Birth: 10/06/1945 Referring Provider:   Flowsheet Row CARDIAC REHAB PHASE II ORIENTATION from 03/21/2024 in [facility] CARDIAC REHABILITATION  Referring Provider Alvan Carrier MD    Encounter Date: 04/11/2024  Check In:  Session Check In - 04/11/24 1426       Check-In   Supervising physician immediately available to respond to emergencies See telemetry face sheet for immediately available MD    Location [facility]    Staff Present Laymon Rattler, BSN, RN, WTA-C;Heather Con, BS, Exercise Physiologist;Victoria Zina, RN    Virtual Visit No    Medication changes reported     No    Fall or balance concerns reported    No    Tobacco Cessation No Change    Warm-up and Cool-down Performed on first and last piece of equipment    Resistance Training Performed Yes    VAD Patient? No    PAD/SET Patient? No      Pain Assessment   Currently in Pain? No/denies          Capillary Blood Glucose: No results found for this or any previous visit (from the past 24 hours).    Tobacco Use History[1]  Goals Met:  Independence with exercise equipment Exercise tolerated well No report of concerns or symptoms today Strength training completed today  Goals Unmet:  Not Applicable  Comments: Pt able to follow exercise prescription today without complaint.  Will continue to monitor for progression.        [1]  Social History Tobacco Use  Smoking Status Former  Smokeless Tobacco Never  Tobacco Comments   quit 20-30 years ago

## 2024-04-12 ENCOUNTER — Other Ambulatory Visit: Payer: Self-pay

## 2024-04-12 ENCOUNTER — Telehealth: Payer: Self-pay

## 2024-04-12 MED ORDER — BREZTRI AEROSPHERE 160-9-4.8 MCG/ACT IN AERO: 2.0000 | INHALATION_SPRAY | Freq: Two times a day (BID) | RESPIRATORY_TRACT | 11 refills | Status: AC

## 2024-04-12 NOTE — Telephone Encounter (Signed)
 Copied from CRM [id number]. Topic: Clinical - Medication Question >> Apr 07, 2024 11:06 AM Duane Stevens wrote: Reason for CRM: Spouse is calling on behalf of pt - requesting prescription for  budesonide-glycopyrrolate-formoterol (BREZTRI  AEROSPHERE) 160-9-4.8 MCG/ACT AERO inhaler [[id number]].  Callback number: [phone]  Pt was given sample of breztri  at [facility] but wasn't sent in to pharm, AVS states to use breztri , pt now requesting this. Is that okay I sent this in to pharm

## 2024-04-12 NOTE — Telephone Encounter (Signed)
 Sent to pharm .Marland KitchenMarland KitchenMarland Kitchen

## 2024-04-13 ENCOUNTER — Encounter (HOSPITAL_COMMUNITY): Payer: Self-pay

## 2024-04-18 ENCOUNTER — Encounter (HOSPITAL_COMMUNITY)
Admission: RE | Admit: 2024-04-18 | Discharge: 2024-04-18 | Disposition: A | Discharge status: Home / Self Care | Payer: Self-pay | Source: Ambulatory Visit | Attending: Cardiology | Admitting: Cardiology

## 2024-04-18 DIAGNOSIS — Z955 Presence of coronary angioplasty implant and graft: Secondary | ICD-10-CM

## 2024-04-18 NOTE — Progress Notes (Signed)
 Daily Session Note  Patient Details  Name: Duane Stevens MRN: [id number] Date of Birth: 13-Aug-1945 Referring Provider:   Flowsheet Row CARDIAC REHAB PHASE II ORIENTATION from 03/21/2024 in [facility] CARDIAC REHABILITATION  Referring Provider Alvan Carrier MD    Encounter Date: 04/18/2024  Check In:  Session Check In - 04/18/24 1435       Check-In   Supervising physician immediately available to respond to emergencies See telemetry face sheet for immediately available MD    Location [facility]    Staff Present Laymon Rattler, BSN, RN, WTA-C;Jelani Trueba Zina, RN    Virtual Visit No    Medication changes reported     No    Fall or balance concerns reported    No    Warm-up and Cool-down Performed on first and last piece of equipment    Resistance Training Performed Yes    VAD Patient? No    PAD/SET Patient? No      Pain Assessment   Currently in Pain? No/denies          Capillary Blood Glucose: No results found for this or any previous visit (from the past 24 hours).    Tobacco Use History[1]  Goals Met:  Independence with exercise equipment Exercise tolerated well No report of concerns or symptoms today Strength training completed today  Goals Unmet:  Not Applicable  Comments: Pt able to follow exercise prescription today without complaint.  Will continue to monitor for progression.        [1]  Social History Tobacco Use  Smoking Status Former  Smokeless Tobacco Never  Tobacco Comments   quit 20-30 years ago

## 2024-04-19 ENCOUNTER — Encounter (HOSPITAL_COMMUNITY): Payer: Self-pay | Admitting: *Deleted

## 2024-04-19 DIAGNOSIS — Z955 Presence of coronary angioplasty implant and graft: Secondary | ICD-10-CM

## 2024-04-19 NOTE — Progress Notes (Signed)
 Cardiac Individual Treatment Plan  Patient Details  Name: Duane Stevens MRN: [id number] Date of Birth: 28-Jul-1945 Referring Provider:   Flowsheet Row CARDIAC REHAB PHASE II ORIENTATION from 03/21/2024 in [facility] CARDIAC REHABILITATION  Referring Provider Alvan Carrier MD    Initial Encounter Date:  Flowsheet Row CARDIAC REHAB PHASE II ORIENTATION from 03/21/2024 in [place] [facility] CARDIAC REHABILITATION  Date 03/21/24    Visit Diagnosis: Status post coronary artery stent placement  Patient's Home Medications on Admission: Current Medications[1]  Past Medical History: Past Medical History:  Diagnosis Date   Allergy    Arthritis    Diabetes mellitus    Hypercholesterolemia    Hypertension    Hypothyroidism    Prostate cancer (HCC)    hx of   Thyroid  disease     Tobacco Use: Tobacco Use History[2]  Labs: Review Flowsheet       Latest Ref Rng & Units 03/04/2024  Labs for ITP Cardiac and Pulmonary Rehab  Cholestrol 100 - 199 mg/dL 848   LDL (calc) 0 - 99 mg/dL 80   HDL-C >60 mg/dL 56   Trlycerides 0 - 850 mg/dL 76     Capillary Blood Glucose: Lab Results  Component Value Date   GLUCAP 186 (H) 04/06/2024   GLUCAP 206 (H) 04/06/2024   GLUCAP 254 (H) 04/01/2024   GLUCAP 143 (H) 04/01/2024   GLUCAP 133 (H) 03/30/2024     Exercise Target Goals: Exercise Program Goal: Individual exercise prescription set using results from initial 6 min walk test and THRR while considering  patients activity barriers and safety.   Exercise Prescription Goal: Starting with aerobic activity 30 plus minutes a day, 3 days per week for initial exercise prescription. Provide home exercise prescription and guidelines that participant acknowledges understanding prior to discharge.  Activity Barriers & Risk Stratification:  Activity Barriers & Cardiac Risk Stratification - 03/14/24 1352       Activity Barriers & Cardiac Risk Stratification   Activity Barriers Balance  Concerns;Arthritis;Assistive Device   Uses cane and walker.   Cardiac Risk Stratification Moderate          6 Minute Walk:  6 Minute Walk     Row Name 03/21/24 1542         6 Minute Walk   Phase Initial     Distance 670 feet     Walk Time 6 minutes     # of Rest Breaks 0     MPH 1.26     METS 1.29     RPE 15     Perceived Dyspnea  3     VO2 Peak 4.53     Symptoms Yes (comment)     Comments SOB, knee pain 3/10     Resting HR 66 bpm     Resting BP 112/66     Resting Oxygen Saturation  95 %     Exercise Oxygen Saturation  during 6 min walk 89 %     Max Ex. HR 99 bpm     Max Ex. BP 146/72     2 Minute Post BP 122/60        Oxygen Initial Assessment:   Oxygen Re-Evaluation:   Oxygen Discharge (Final Oxygen Re-Evaluation):   Initial Exercise Prescription:  Initial Exercise Prescription - 03/21/24 1600       Date of Initial Exercise RX and Referring Provider   Date 03/21/24    Referring Provider Alvan Carrier MD      Oxygen   Maintain Oxygen  Saturation 88% or higher      NuStep   Level 1    SPM 80    Minutes 15    METs 2      Arm Ergometer   Level 1    RPM 50    Minutes 15    METs 2      Prescription Details   Frequency (times per week) 3    Duration Progress to 30 minutes of continuous aerobic without signs/symptoms of physical distress      Intensity   THRR 40-80% of Max Heartrate 96-125    Ratings of Perceived Exertion 11-13    Perceived Dyspnea 0-4      Progression   Progression Continue to progress workloads to maintain intensity without signs/symptoms of physical distress.      Resistance Training   Training Prescription Yes    Weight 3 lb    Reps 10-15          Perform Capillary Blood Glucose checks as needed.  Exercise Prescription Changes:   Exercise Prescription Changes     Row Name 03/21/24 1600 04/08/24 1500           Response to Exercise   Blood Pressure (Admit) 112/66 136/80      Blood Pressure (Exercise)  146/72 140/70      Blood Pressure (Exit) 122/60 130/60      Heart Rate (Admit) 66 bpm 70 bpm      Heart Rate (Exercise) 99 bpm 106 bpm      Heart Rate (Exit) 76 bpm 77 bpm      Oxygen Saturation (Admit) 95 % --      Oxygen Saturation (Exercise) 89 % --      Rating of Perceived Exertion (Exercise) 15 15      Perceived Dyspnea (Exercise) 3 --      Symptoms SOB, knee pain 3/10 --      Comments walk test results --      Duration -- Continue with 30 min of aerobic exercise without signs/symptoms of physical distress.      Intensity -- THRR unchanged        Progression   Progression -- Continue to progress workloads to maintain intensity without signs/symptoms of physical distress.        Resistance Training   Weight -- 3      Reps -- 10-15        NuStep   Level -- 1      SPM -- 41      Minutes -- 15      METs -- 1.7        Arm Ergometer   Level -- 1      RPM -- 45      Minutes -- 15      METs -- 1.8         Exercise Comments:   Exercise Comments     Row Name 03/30/24 1421           Exercise Comments First full day of exercise!  Patient was oriented to gym and equipment including functions, settings, policies, and procedures.  Patient's individual exercise prescription and treatment plan were reviewed.  All starting workloads were established based on the results of the 6 minute walk test done at initial orientation visit.  The plan for exercise progression was also introduced and progression will be customized based on patient's performance and goals.          Exercise Goals and Review:  Exercise Goals     Row Name 03/21/24 1606             Exercise Goals   Increase Physical Activity Yes       Intervention Provide advice, education, support and counseling about physical activity/exercise needs.;Develop an individualized exercise prescription for aerobic and resistive training based on initial evaluation findings, risk stratification, comorbidities and  participant's personal goals.       Expected Outcomes Short Term: Attend rehab on a regular basis to increase amount of physical activity.;Long Term: Exercising regularly at least 3-5 days a week.;Long Term: Add in home exercise to make exercise part of routine and to increase amount of physical activity.       Increase Strength and Stamina Yes       Intervention Provide advice, education, support and counseling about physical activity/exercise needs.;Develop an individualized exercise prescription for aerobic and resistive training based on initial evaluation findings, risk stratification, comorbidities and participant's personal goals.       Expected Outcomes Short Term: Increase workloads from initial exercise prescription for resistance, speed, and METs.;Short Term: Perform resistance training exercises routinely during rehab and add in resistance training at home;Long Term: Improve cardiorespiratory fitness, muscular endurance and strength as measured by increased METs and functional capacity ( )       Able to understand and use rate of perceived exertion (RPE) scale Yes       Intervention Provide education and explanation on how to use RPE scale       Expected Outcomes Short Term: Able to use RPE daily in rehab to express subjective intensity level;Long Term:  Able to use RPE to guide intensity level when exercising independently       Able to understand and use Dyspnea scale Yes       Intervention Provide education and explanation on how to use Dyspnea scale       Expected Outcomes Short Term: Able to use Dyspnea scale daily in rehab to express subjective sense of shortness of breath during exertion;Long Term: Able to use Dyspnea scale to guide intensity level when exercising independently       Knowledge and understanding of Target Heart Rate Range (THRR) Yes       Intervention Provide education and explanation of THRR including how the numbers were predicted and where they are located for  reference       Expected Outcomes Short Term: Able to state/look up THRR;Long Term: Able to use THRR to govern intensity when exercising independently;Short Term: Able to use daily as guideline for intensity in rehab       Able to check pulse independently Yes       Intervention Provide education and demonstration on how to check pulse in carotid and radial arteries.;Review the importance of being able to check your own pulse for safety during independent exercise       Expected Outcomes Short Term: Able to explain why pulse checking is important during independent exercise;Long Term: Able to check pulse independently and accurately       Understanding of Exercise Prescription Yes       Intervention Provide education, explanation, and written materials on patient's individual exercise prescription       Expected Outcomes Short Term: Able to explain program exercise prescription;Long Term: Able to explain home exercise prescription to exercise independently          Exercise Goals Re-Evaluation :  Exercise Goals Re-Evaluation     Row Name 03/30/24 1422 04/11/24  1455           Exercise Goal Re-Evaluation   Exercise Goals Review Able to understand and use rate of perceived exertion (RPE) scale;Knowledge and understanding of Target Heart Rate Range (THRR) Increase Physical Activity;Able to understand and use rate of perceived exertion (RPE) scale;Knowledge and understanding of Target Heart Rate Range (THRR);Understanding of Exercise Prescription;Increase Strength and Stamina;Able to understand and use Dyspnea scale;Able to check pulse independently      Comments Reviewed RPE and dyspnea scale, THR and program prescription with pt today.  Pt voiced understanding and was given a copy of goals to take home. Duane Stevens is doing great in rehab! Duane Stevens notes that he only exercises here in rehab. He doesn't really seem to mind, he notes he doesn't really care to exercise. Mentioned maybe finding somewhere him  and his wife would like to walk, and try to do that a few days a week.      Expected Outcomes Short: Use RPE daily to regulate intensity.  Long: Follow program prescription in THR. Short: continue to attend rehab. Long: try to incorporate more walking into daily routines.          Discharge Exercise Prescription (Final Exercise Prescription Changes):  Exercise Prescription Changes - 04/08/24 1500       Response to Exercise   Blood Pressure (Admit) 136/80    Blood Pressure (Exercise) 140/70    Blood Pressure (Exit) 130/60    Heart Rate (Admit) 70 bpm    Heart Rate (Exercise) 106 bpm    Heart Rate (Exit) 77 bpm    Rating of Perceived Exertion (Exercise) 15    Duration Continue with 30 min of aerobic exercise without signs/symptoms of physical distress.    Intensity THRR unchanged      Progression   Progression Continue to progress workloads to maintain intensity without signs/symptoms of physical distress.      Resistance Training   Weight 3    Reps 10-15      NuStep   Level 1    SPM 41    Minutes 15    METs 1.7      Arm Ergometer   Level 1    RPM 45    Minutes 15    METs 1.8          Nutrition:  Target Goals: Understanding of nutrition guidelines, daily intake of sodium 1500mg , cholesterol 200mg , calories 30% from fat and 7% or less from saturated fats, daily to have 5 or more servings of fruits and vegetables.  Biometrics:  Pre Biometrics - 03/21/24 1607       Pre Biometrics   Height 5' 9 (1.753 m)    Weight 197 lb 3.2 oz (89.4 kg)    Waist Circumference 43 inches    Hip Circumference 42 inches    Waist to Hip Ratio 1.02 %    BMI (Calculated) 29.11    Grip Strength 28.1 kg    Single Leg Stand 0 seconds           Nutrition Therapy Plan and Nutrition Goals:  Nutrition Therapy & Goals - 03/21/24 1607       Intervention Plan   Intervention Prescribe, educate and counsel regarding individualized specific dietary modifications aiming towards  targeted core components such as weight, hypertension, lipid management, diabetes, heart failure and other comorbidities.;Nutrition handout(s) given to patient.    Expected Outcomes Short Term Goal: Understand basic principles of dietary content, such as calories, fat, sodium, cholesterol and nutrients.;Long  Term Goal: Adherence to prescribed nutrition plan.          Nutrition Assessments:  MEDIFICTS Score Key: >=70 Need to make dietary changes  40-70 Heart Healthy Diet <= 40 Therapeutic Level Cholesterol Diet  Flowsheet Row CARDIAC REHAB PHASE II ORIENTATION from 03/21/2024 in [facility] CARDIAC RE[facility]ILITATION  Picture Your Plate Total Score on Admission 51   Picture Your Plate Scores: <59 Unhealthy dietary pattern with much room for improvement. 41-50 Dietary pattern unlikely to meet recommendations for good health and room for improvement. 51-60 More healthful dietary pattern, with some room for improvement.  >60 Healthy dietary pattern, although there may be some specific behaviors that could be improved.    Nutrition Goals Re-Evaluation:  Nutrition Goals Re-Evaluation     Row Name 04/11/24 1449             Goals   Nutrition Goal Healthy eating       Comment Duane Stevens notes he does not follow any diet whatsoever. Duane Stevens and his wife eat out mostly, they will split combos. Duane Stevens notes he doesn't really watch his salt intake, but he has really cut out a lot of red meat, he eats mostly chicken now. Duane Stevens is also diabetic, but doesn't watch his carb intake.       Expected Outcome Short: try to cook and eat more meals at home. Long: monitor salt and carb intake better.          Nutrition Goals Discharge (Final Nutrition Goals Re-Evaluation):  Nutrition Goals Re-Evaluation - 04/11/24 1449       Goals   Nutrition Goal Healthy eating    Comment Duane Stevens notes he does not follow any diet whatsoever. Duane Stevens and his wife eat out mostly, they will split combos. Duane Stevens notes he  doesn't really watch his salt intake, but he has really cut out a lot of red meat, he eats mostly chicken now. Duane Stevens is also diabetic, but doesn't watch his carb intake.    Expected Outcome Short: try to cook and eat more meals at home. Long: monitor salt and carb intake better.          Psychosocial: Target Goals: Acknowledge presence or absence of significant depression and/or stress, maximize coping skills, provide positive support system. Participant is able to verbalize types and ability to use techniques and skills needed for reducing stress and depression.  Initial Review & Psychosocial Screening:  Initial Psych Review & Screening - 03/14/24 1405       Initial Review   Current issues with None Identified      Family Dynamics   Good Support System? Yes    Comments Patient's wife is his main support.      Barriers   Psychosocial barriers to participate in program The patient should benefit from training in stress management and relaxation.;Psychosocial barriers identified (see note)      Screening Interventions   Interventions Provide feedback about the scores to participant;Encouraged to exercise    Expected Outcomes Short Term goal: Utilizing psychosocial counselor, staff and physician to assist with identification of specific Stressors or current issues interfering with healing process. Setting desired goal for each stressor or current issue identified.;Long Term Goal: Stressors or current issues are controlled or eliminated.;Short Term goal: Identification and review with participant of any Quality of Life or Depression concerns found by scoring the questionnaire.;Long Term goal: The participant improves quality of Life and PHQ9 Scores as seen by post scores and/or verbalization of changes  Quality of Life Scores:  Quality of Life - 03/21/24 1607       Quality of Life   Select Quality of Life      Quality of Life Scores   Health/Function Pre 23.46 %     Socioeconomic Pre 25.5 %    Psych/Spiritual Pre 29.64 %    Family Pre 26.4 %    GLOBAL Pre 25.66 %         Scores of 19 and below usually indicate a poorer quality of life in these areas.  A difference of  2-3 points is a clinically meaningful difference.  A difference of 2-3 points in the total score of the Quality of Life Index has been associated with significant improvement in overall quality of life, self-image, physical symptoms, and general health in studies assessing change in quality of life.  PHQ-9: Review Flowsheet       03/21/2024  Depression screen PHQ 2/9  Decreased Interest 0  Down, Depressed, Hopeless 0  PHQ - 2 Score 0  Altered sleeping 0  Tired, decreased energy 2  Change in appetite 0  Feeling bad or failure about yourself  0  Trouble concentrating 0  Moving slowly or fidgety/restless 2  Suicidal thoughts 0  PHQ-9 Score 4  Difficult doing work/chores Somewhat difficult   Interpretation of Total Score  Total Score Depression Severity:  1-4 = Minimal depression, 5-9 = Mild depression, 10-14 = Moderate depression, 15-19 = Moderately severe depression, 20-27 = Severe depression   Psychosocial Evaluation and Intervention:  Psychosocial Evaluation - 03/14/24 1406       Psychosocial Evaluation & Interventions   Interventions Stress management education;Relaxation education;Encouraged to exercise with the program and follow exercise prescription    Comments Patient was referred to cardiac rehab with stent placement 03/09/24. I spoke with his wife. He says she does better with answering questions. She denies any depression or anxiety. She reports he sleeps okay at night. He is retired but still remains active. He enjoys working with wood. She says he continues to have some bleeding from his catheterization site/right wrist and would like to follow up with Dr. Alvan on 12/5 before he starts the program. She is good with proceeding with his orientation visit 12/1. His  goals for the program are to improve his mobility overall; improve his balance and walking. He uses a cane or a walker for ambulation. He has vertigo at times and he uses a walker with these episodes. He has no barriers identified to complete the program.    Expected Outcomes Short Term: Patient will start the program and attend consistently. Long Term: Patient will complete the program meeting personal goals.    Continue Psychosocial Services  Follow up required by staff          Psychosocial Re-Evaluation:  Psychosocial Re-Evaluation     Row Name 04/11/24 1447             Psychosocial Re-Evaluation   Current issues with None Identified       Comments Duane Stevens doesn't complain of any stress. He also seems very stress free in class, always talkative and friendly. Duane Stevens notes he doesn't have any trouble sleeping but he does wake up a couple of times per night to use the bathrom (fluid pills) but goes right back to sleep.       Expected Outcomes Short: continue to attend rehab. Long: continue to have healthy stress outlets.       Interventions Encouraged to attend  Cardiac Rehabilitation for the exercise       Continue Psychosocial Services  Follow up required by staff          Psychosocial Discharge (Final Psychosocial Re-Evaluation):  Psychosocial Re-Evaluation - 04/11/24 1447       Psychosocial Re-Evaluation   Current issues with None Identified    Comments Alba doesn't complain of any stress. He also seems very stress free in class, always talkative and friendly. Duane Stevens notes he doesn't have any trouble sleeping but he does wake up a couple of times per night to use the bathrom (fluid pills) but goes right back to sleep.    Expected Outcomes Short: continue to attend rehab. Long: continue to have healthy stress outlets.    Interventions Encouraged to attend Cardiac Rehabilitation for the exercise    Continue Psychosocial Services  Follow up required by staff           Vocational Rehabilitation: Provide vocational rehab assistance to qualifying candidates.   Vocational Rehab Evaluation & Intervention:  Vocational Rehab - 03/14/24 1405       Initial Vocational Rehab Evaluation & Intervention   Assessment shows need for Vocational Rehabilitation No      Vocational Rehab Re-Evaulation   Comments Retired.          Education: Education Goals: Education classes will be provided on a weekly basis, covering required topics. Participant will state understanding/return demonstration of topics presented.  Learning Barriers/Preferences:  Learning Barriers/Preferences - 03/14/24 1405       Learning Barriers/Preferences   Learning Barriers None    Learning Preferences Skilled Demonstration;Computer/Internet          Education Topics: Hypertension, Hypertension Reduction -Define heart disease and high blood pressure. Discus how high blood pressure affects the body and ways to reduce high blood pressure.   Exercise and Your Heart -Discuss why it is important to exercise, the FITT principles of exercise, normal and abnormal responses to exercise, and how to exercise safely.   Angina -Discuss definition of angina, causes of angina, treatment of angina, and how to decrease risk of having angina.   Cardiac Medications -Review what the following cardiac medications are used for, how they affect the body, and side effects that may occur when taking the medications.  Medications include Aspirin , Beta blockers, calcium  channel blockers, ACE Inhibitors, angiotensin receptor blockers, diuretics, digoxin, and antihyperlipidemics.   Congestive Heart Failure -Discuss the definition of CHF, how to live with CHF, the signs and symptoms of CHF, and how keep track of weight and sodium intake.   Heart Disease and Intimacy -Discus the effect sexual activity has on the heart, how changes occur during intimacy as we age, and safety during sexual  activity.   Smoking Cessation / COPD -Discuss different methods to quit smoking, the health benefits of quitting smoking, and the definition of COPD.   Nutrition I: Fats -Discuss the types of cholesterol, what cholesterol does to the heart, and how cholesterol levels can be controlled.   Nutrition II: Labels -Discuss the different components of food labels and how to read food label   Heart Parts/Heart Disease and PAD -Discuss the anatomy of the heart, the pathway of blood circulation through the heart, and these are affected by heart disease.   Stress I: Signs and Symptoms -Discuss the causes of stress, how stress may lead to anxiety and depression, and ways to limit stress.   Stress II: Relaxation -Discuss different types of relaxation techniques to limit stress.   Warning  Signs of Stroke / TIA -Discuss definition of a stroke, what the signs and symptoms are of a stroke, and how to identify when someone is having stroke.   Knowledge Questionnaire Score:  Knowledge Questionnaire Score - 03/21/24 1608       Knowledge Questionnaire Score   Pre Score 21/26          Core Components/Risk Factors/Patient Goals at Admission:  Personal Goals and Risk Factors at Admission - 03/21/24 1608       Core Components/Risk Factors/Patient Goals on Admission    Weight Management Yes;Weight Maintenance;Weight Loss    Intervention Weight Management: Develop a combined nutrition and exercise program designed to reach desired caloric intake, while maintaining appropriate intake of nutrient and fiber, sodium and fats, and appropriate energy expenditure required for the weight goal.;Weight Management: Provide education and appropriate resources to help participant work on and attain dietary goals.    Admit Weight 197 lb 3.2 oz (89.4 kg)    Goal Weight: Short Term 195 lb (88.5 kg)    Goal Weight: Long Term 193 lb (87.5 kg)    Expected Outcomes Short Term: Continue to assess and modify  interventions until short term weight is achieved;Long Term: Adherence to nutrition and physical activity/exercise program aimed toward attainment of established weight goal;Weight Maintenance: Understanding of the daily nutrition guidelines, which includes 25-35% calories from fat, 7% or less cal from saturated fats, less than 200mg  cholesterol, less than 1.5gm of sodium, & 5 or more servings of fruits and vegetables daily;Weight Loss: Understanding of general recommendations for a balanced deficit meal plan, which promotes 1-2 lb weight loss per week and includes a negative energy balance of [phone] kcal/d;Understanding recommendations for meals to include 15-35% energy as protein, 25-35% energy from fat, 35-60% energy from carbohydrates, less than 200mg  of dietary cholesterol, 20-35 gm of total fiber daily;Understanding of distribution of calorie intake throughout the day with the consumption of 4-5 meals/snacks    Improve shortness of breath with ADL's Yes    Intervention Provide education, individualized exercise plan and daily activity instruction to help decrease symptoms of SOB with activities of daily living.    Expected Outcomes Short Term: Improve cardiorespiratory fitness to achieve a reduction of symptoms when performing ADLs;Long Term: Be able to perform more ADLs without symptoms or delay the onset of symptoms    Diabetes Yes    Intervention Provide education about signs/symptoms and action to take for hypo/hyperglycemia.;Provide education about proper nutrition, including hydration, and aerobic/resistive exercise prescription along with prescribed medications to achieve blood glucose in normal ranges: Fasting glucose 65-99 mg/dL    Expected Outcomes Short Term: Participant verbalizes understanding of the signs/symptoms and immediate care of hyper/hypoglycemia, proper foot care and importance of medication, aerobic/resistive exercise and nutrition plan for blood glucose control.;Long Term:  Attainment of HbA1C < 7%.    Hypertension Yes    Intervention Provide education on lifestyle modifcations including regular physical activity/exercise, weight management, moderate sodium restriction and increased consumption of fresh fruit, vegetables, and low fat dairy, alcohol moderation, and smoking cessation.;Monitor prescription use compliance.    Expected Outcomes Short Term: Continued assessment and intervention until BP is < 140/44mm HG in hypertensive participants. < 130/39mm HG in hypertensive participants with diabetes, heart failure or chronic kidney disease.;Long Term: Maintenance of blood pressure at goal levels.    Lipids Yes    Intervention Provide education and support for participant on nutrition & aerobic/resistive exercise along with prescribed medications to achieve LDL 70mg , HDL >40mg .  Expected Outcomes Short Term: Participant states understanding of desired cholesterol values and is compliant with medications prescribed. Participant is following exercise prescription and nutrition guidelines.;Long Term: Cholesterol controlled with medications as prescribed, with individualized exercise RX and with personalized nutrition plan. Value goals: LDL < 70mg , HDL > 40 mg.          Core Components/Risk Factors/Patient Goals Review:   Goals and Risk Factor Review     Row Name 04/11/24 1452             Core Components/Risk Factors/Patient Goals Review   Personal Goals Review Diabetes;Lipids;Hypertension;Heart Failure       Review Duane Stevens follows up with his doctors when needed and takes all of his medications as prescribed. Duane Stevens does not check his blood pressure or sugar at home.       Expected Outcomes Short: start to monitor blood sugar and blood pressure at home. Long: continue to follow up with MD and take medications as prescribed.          Core Components/Risk Factors/Patient Goals at Discharge (Final Review):   Goals and Risk Factor Review - 04/11/24 1452        Core Components/Risk Factors/Patient Goals Review   Personal Goals Review Diabetes;Lipids;Hypertension;Heart Failure    Review Duane Stevens follows up with his doctors when needed and takes all of his medications as prescribed. Duane Stevens does not check his blood pressure or sugar at home.    Expected Outcomes Short: start to monitor blood sugar and blood pressure at home. Long: continue to follow up with MD and take medications as prescribed.          ITP Comments:  ITP Comments     Row Name 03/14/24 1413 03/21/24 1539 03/22/24 1011 03/30/24 1422 04/19/24 1408   ITP Comments Virtual orientation visit completed for cardiac rehab with status post coronary artery stent placement. On-site orientation visit scheduled for Monday 03/21/24. Documentation 1 pm. Patient attend orientation today.  Patient is attending Cardiac Rehabilitation Program.  Documentation for diagnosis can be found in [facility] 03/09/24.  Reviewed medical chart, RPE/RPD, gym safety, and program guidelines.  Patient was fitted to equipment they will be using during rehab.  Patient is scheduled to start exercise on Wednesday 03/30/24 at 1430.   Initial ITP created and sent for review and signature by Dr. Dorn Ross, Medical Director for Cardiac Rehabilitation Program. 30 day review completed. ITP sent to Dr. Dorn Ross, Medical Director of Cardiac Rehab. Continue with ITP unless changes are made by physician.  Only oriented yesterday First full day of exercise!  Patient was oriented to gym and equipment including functions, settings, policies, and procedures.  Patient's individual exercise prescription and treatment plan were reviewed.  All starting workloads were established based on the results of the 6 minute walk test done at initial orientation visit.  The plan for exercise progression was also introduced and progression will be customized based on patient's performance and goals. 30 day review completed. ITP sent to Dr. Dorn Ross,  Medical Director of Cardiac Rehab. Continue with ITP unless changes are made by physician.      Comments: 30 day review     [1]  Current Outpatient Medications:    aspirin  81 MG tablet, Take 81 mg by mouth every evening., Disp: , Rfl:    budesonide-glycopyrrolate-formoterol (BREZTRI  AEROSPHERE) 160-9-4.8 MCG/ACT AERO inhaler, Inhale 2 puffs into the lungs in the morning and at bedtime., Disp: 10.7 g, Rfl: 11   Cholecalciferol  (VITAMIN D3) 1000 UNITS CAPS,  Take 1 capsule by mouth every evening., Disp: , Rfl:    clopidogrel  (PLAVIX ) 75 MG tablet, Take 1 tablet (75 mg total) by mouth daily with breakfast., Disp: 90 tablet, Rfl: 3   furosemide  (LASIX ) 20 MG tablet, TAKE 2 TABLETS BY MOUTH AS NEEDED FOR EDEMA (SWELLING) **THIS IS A DOSE INCREASE**, Disp: 60 tablet, Rfl: 6   glipiZIDE  (GLUCOTROL  XL) 2.5 MG 24 hr tablet, Take 2.5 mg by mouth daily., Disp: , Rfl:    levothyroxine  (SYNTHROID ) 88 MCG tablet, Take 1 tablet by mouth once daily, Disp: 90 tablet, Rfl: 0   loratadine (CLARITIN) 10 MG tablet, Take 10 mg by mouth daily as needed for allergies., Disp: , Rfl:    losartan  (COZAAR ) 25 MG tablet, Take 25 mg by mouth daily. , Disp: , Rfl:    meclizine  (ANTIVERT ) 25 MG tablet, Take 1 tablet (25 mg total) by mouth 3 (three) times daily as needed for dizziness. May cause drowsiness, Disp: 30 tablet, Rfl: 0   metFORMIN  (GLUCOPHAGE ) 500 MG tablet, Take 2 tablets (1,000 mg total) by mouth 2 (two) times daily., Disp: , Rfl:    metFORMIN  (GLUCOPHAGE ) 500 MG tablet, Take 2 tablets (1,000 mg total) by mouth 2 (two) times daily for diabetes., Disp: 400 tablet, Rfl: 3   metoprolol  succinate (TOPROL -XL) 25 MG 24 hr tablet, Take 1 tablet by mouth once daily, Disp: 90 tablet, Rfl: 3   nitroGLYCERIN  (NITROSTAT ) 0.4 MG SL tablet, Place 1 tablet (0.4 mg total) under the tongue every 5 (five) minutes as needed., Disp: 25 tablet, Rfl: 3   Propylene Glycol (SYSTANE COMPLETE OP), Apply 1 drop to eye daily., Disp: , Rfl:     rOPINIRole (REQUIP) 0.5 MG tablet, Take by mouth., Disp: , Rfl:    rosuvastatin  (CRESTOR ) 20 MG tablet, Take 1 tablet (20 mg total) by mouth daily., Disp: 90 tablet, Rfl: 3   simvastatin  (ZOCOR ) 20 MG tablet, Take 1 tablet (20 mg total) by mouth daily for high cholesterol., Disp: 100 tablet, Rfl: 3   Turmeric 500 MG TABS, Take 500 mg by mouth every evening., Disp: , Rfl:    White Petrolatum-Mineral Oil (SYSTANE NIGHTTIME) OINT, Apply 1 Application to eye at bedtime as needed (dry eye)., Disp: , Rfl:    zinc gluconate 50 MG tablet, Take 50 mg by mouth daily. (Patient not taking: Reported on 03/31/2024), Disp: , Rfl:  [2]  Social History Tobacco Use  Smoking Status Former  Smokeless Tobacco Never  Tobacco Comments   quit 20-30 years ago

## 2024-04-20 ENCOUNTER — Encounter (HOSPITAL_COMMUNITY)
Admission: RE | Admit: 2024-04-20 | Discharge: 2024-04-20 | Disposition: A | Discharge status: Home / Self Care | Payer: Self-pay | Source: Ambulatory Visit | Attending: Cardiology | Admitting: Cardiology

## 2024-04-20 DIAGNOSIS — Z955 Presence of coronary angioplasty implant and graft: Secondary | ICD-10-CM

## 2024-04-20 NOTE — Progress Notes (Signed)
 Daily Session Note  Patient Details  Name: Tyrice Hewitt MRN: [id number] Date of Birth: 06/22/1945 Referring Provider:   Flowsheet Row CARDIAC REHAB PHASE II ORIENTATION from 03/21/2024 in [facility] CARDIAC REHABILITATION  Referring Provider Alvan Carrier MD    Encounter Date: 04/20/2024  Check In:  Session Check In - 04/20/24 1417       Check-In   Supervising physician immediately available to respond to emergencies See telemetry face sheet for immediately available MD    Location [facility]    Staff Present Laymon Rattler, BSN, RN, WTA-C;Heather Con, BS, Exercise Physiologist    Virtual Visit No    Medication changes reported     No    Fall or balance concerns reported    No    Tobacco Cessation No Change    Warm-up and Cool-down Performed on first and last piece of equipment    Resistance Training Performed Yes    VAD Patient? No    PAD/SET Patient? No      Pain Assessment   Currently in Pain? No/denies          Capillary Blood Glucose: No results found for this or any previous visit (from the past 24 hours).    Tobacco Use History[1]  Goals Met:  Independence with exercise equipment Exercise tolerated well No report of concerns or symptoms today Strength training completed today  Goals Unmet:  Not Applicable  Comments: Pt able to follow exercise prescription today without complaint.  Will continue to monitor for progression.        [1]  Social History Tobacco Use  Smoking Status Former  Smokeless Tobacco Never  Tobacco Comments   quit 20-30 years ago

## 2024-04-22 ENCOUNTER — Encounter (HOSPITAL_COMMUNITY)
Admission: RE | Admit: 2024-04-22 | Discharge: 2024-04-22 | Disposition: A | Discharge status: Home / Self Care | Payer: Self-pay | Source: Ambulatory Visit | Attending: Cardiology | Admitting: Cardiology

## 2024-04-22 DIAGNOSIS — Z955 Presence of coronary angioplasty implant and graft: Secondary | ICD-10-CM | POA: Insufficient documentation

## 2024-04-22 NOTE — Progress Notes (Signed)
 Daily Session Note  Patient Details  Name: Duane Stevens MRN: [id number] Date of Birth: 1945-05-11 Referring Provider:   Flowsheet Row CARDIAC REHAB PHASE II ORIENTATION from 03/21/2024 in [facility] CARDIAC REHABILITATION  Referring Provider Alvan Carrier MD    Encounter Date: 04/22/2024  Check In:  Session Check In - 04/22/24 1300       Check-In   Supervising physician immediately available to respond to emergencies See telemetry face sheet for immediately available MD    Location [facility]    Staff Present Richerd Buddle, RN;Zareah Hunzeker Vicci, RN, BSN;Heather Con, BS, Exercise Physiologist    Virtual Visit No    Medication changes reported     No    Fall or balance concerns reported    No    Warm-up and Cool-down Performed as group-led instruction    Resistance Training Performed Yes    VAD Patient? No    PAD/SET Patient? No      Pain Assessment   Currently in Pain? No/denies    Pain Score 0-No pain    Multiple Pain Sites No          Capillary Blood Glucose: No results found for this or any previous visit (from the past 24 hours).    Tobacco Use History[1]  Goals Met:  Independence with exercise equipment Exercise tolerated well No report of concerns or symptoms today Strength training completed today  Goals Unmet:  Not Applicable  Comments: Pt able to follow exercise prescription today without complaint.  Will continue to monitor for progression.        [1]  Social History Tobacco Use  Smoking Status Former  Smokeless Tobacco Never  Tobacco Comments   quit 20-30 years ago

## 2024-04-25 ENCOUNTER — Encounter (HOSPITAL_COMMUNITY)
Admission: RE | Admit: 2024-04-25 | Discharge: 2024-04-25 | Disposition: A | Discharge status: Home / Self Care | Payer: Self-pay | Source: Ambulatory Visit | Attending: Cardiology | Admitting: Cardiology

## 2024-04-25 DIAGNOSIS — Z955 Presence of coronary angioplasty implant and graft: Secondary | ICD-10-CM

## 2024-04-25 NOTE — Progress Notes (Signed)
 Daily Session Note  Patient Details  Name: Wadell Craddock MRN: [id number] Date of Birth: December 14, 1945 Referring Provider:   Flowsheet Row CARDIAC REHAB PHASE II ORIENTATION from 03/21/2024 in [facility] CARDIAC REHABILITATION  Referring Provider Alvan Carrier MD    Encounter Date: 04/25/2024  Check In:  Session Check In - 04/25/24 1429       Check-In   Supervising physician immediately available to respond to emergencies See telemetry face sheet for immediately available MD    Location [facility]    Staff Present Powell Benders, BS, Exercise Physiologist;Brittany Jackquline, BSN, RN, WTA-C    Virtual Visit No    Medication changes reported     No    Fall or balance concerns reported    No    Tobacco Cessation No Change    Warm-up and Cool-down Performed on first and last piece of equipment    Resistance Training Performed Yes    VAD Patient? No    PAD/SET Patient? No      Pain Assessment   Currently in Pain? No/denies    Pain Score 0-No pain    Multiple Pain Sites No          Capillary Blood Glucose: No results found for this or any previous visit (from the past 24 hours).    Tobacco Use History[1]  Goals Met:  Independence with exercise equipment Exercise tolerated well No report of concerns or symptoms today Strength training completed today  Goals Unmet:  Not Applicable  Comments: Pt able to follow exercise prescription today without complaint.  Will continue to monitor for progression.        [1]  Social History Tobacco Use  Smoking Status Former  Smokeless Tobacco Never  Tobacco Comments   quit 20-30 years ago

## 2024-04-27 ENCOUNTER — Encounter (HOSPITAL_COMMUNITY)
Admission: RE | Admit: 2024-04-27 | Discharge: 2024-04-27 | Disposition: A | Discharge status: Home / Self Care | Payer: Self-pay | Source: Ambulatory Visit | Attending: Cardiology | Admitting: Cardiology

## 2024-04-28 ENCOUNTER — Ambulatory Visit (INDEPENDENT_AMBULATORY_CARE_PROVIDER_SITE_OTHER): Admitting: Endocrinology

## 2024-04-28 ENCOUNTER — Encounter: Payer: Self-pay | Admitting: Endocrinology

## 2024-04-28 ENCOUNTER — Other Ambulatory Visit

## 2024-04-28 VITALS — BP 150/72 | HR 84 | Resp 16 | Ht 69.0 in | Wt 197.0 lb

## 2024-04-28 DIAGNOSIS — E890 Postprocedural hypothyroidism: Secondary | ICD-10-CM | POA: Diagnosis not present

## 2024-04-28 MED ORDER — LEVOTHYROXINE SODIUM 88 MCG PO TABS: 88.0000 ug | ORAL_TABLET | Freq: Every day | ORAL | 3 refills | Status: AC

## 2024-04-28 NOTE — Progress Notes (Signed)
 "  Outpatient Endocrinology Note Kayslee Furey, MD  04/28/2024  Patient's Name: Duane Stevens    DOB: 11/30/1945    MRN: [id number]  REASON OF VISIT: Follow-up for hypothyroidism  PCP: Lari Elspeth BRAVO, MD  HISTORY OF PRESENT ILLNESS:   Burrell Hodapp is a 79 y.o. old male with past medical history as listed below is presented for a follow up of hypothyroidism.    Pertinent Thyroid  History: Patient was diagnosed with hypothyroidism in 2017.  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  Patient was treated with various doses of levothyroxine  in the past.  Initially levothyroxine  was started at 50 mcg daily and dose was adjusted periodically in the past.  In May 2024 patient had mildly low TSH and asked to decrease the dose of levothyroxine  100 mcg daily for 6 days a week and half tablet on seventh day.  In November 2024 levothyroxine  was decreased from 100 mcg to 88 mg daily when TSH was mildly low.  # He has type 2 diabetes mellitus managed by primary care provider.  Interval history 04/28/2024 Patient return for follow-up of hypothyroidism.  In the visit in November 2024 he had plan to follow-up with primary care provider versus [facility] endocrinology.  Recently, he has complaints of increased fatigue, blurry vision, hand tremors and shortness of breath, and presented today for the follow-up of hypothyroidism.  He has been following with cardiology and pulmonary.  He reports he has coronary artery disease requiring stent.  He denies palpitation or heat intolerance.  Body weight is relatively stable.  No change in bowel habits.  He has been taking levothyroxine  88 mcg daily in the morning and reports compliance.  He had lab on March 31, 2024 with normal TSH of 1.090.  He is accompanied by wife in the clinic today.   Latest Reference Range & Units 03/31/24 09:51  TSH 0.450 - 4.500 uIU/mL 1.090     REVIEW OF  SYSTEMS:  As per history of present illness.   PAST MEDICAL HISTORY: Past Medical History:  Diagnosis Date   Allergy    Arthritis    Diabetes mellitus    Hypercholesterolemia    Hypertension    Hypothyroidism    Prostate cancer (HCC)    hx of   Thyroid  disease     PAST SURGICAL HISTORY: Past Surgical History:  Procedure Laterality Date   ANKLE SURGERY     BACK SURGERY     CORONARY PRESSURE/FFR WITH 3D MAPPING N/A 03/09/2024   Procedure: Coronary Pressure/FFR w/3D Mapping;  Surgeon: Jordan, Peter M, MD;  Location: [facility] INVASIVE CV LAB;  Service: Cardiovascular;  Laterality: N/A;   CORONARY STENT INTERVENTION N/A 03/09/2024   Procedure: CORONARY STENT INTERVENTION;  Surgeon: Jordan, Peter M, MD;  Location: [facility] INVASIVE CV LAB;  Service: Cardiovascular;  Laterality: N/A;   HERNIA REPAIR     LEFT HEART CATH AND CORONARY ANGIOGRAPHY N/A 03/09/2024   Procedure: LEFT HEART CATH AND CORONARY ANGIOGRAPHY;  Surgeon: Jordan, Peter M, MD;  Location: [facility] INVASIVE CV LAB;  Service: Cardiovascular;  Laterality: N/A;   PROSTATECTOMY      ALLERGIES: Allergies  Allergen Reactions   Bee Venom Hives and Swelling    Yellow Jacket   Penicillins Other (See Comments)    Did it involve swelling of the face/tongue/throat, SOB, or low BP? Unknown Did it involve sudden or severe rash/hives, skin peeling, or any reaction on the inside of  your mouth or nose? Unknown Did you need to seek medical attention at a hospital or doctor's office? Unknown When did it last happen?   Over 10 years    If all above answers are NO, may proceed with cephalosporin use.    Oxycodone Hcl Itching    FAMILY HISTORY:  Family History  Problem Relation Age of Onset   Cancer Other        FH of Lung Cancer   Cancer Other        FH of Prostate Cancer   Diabetes Other        Parent   Heart disease Other        Parent    SOCIAL HISTORY: Social History   Socioeconomic History   Marital status: Married    Spouse  name: Not on file   Number of children: Not on file   Years of education: Not on file   Highest education level: Not on file  Occupational History   Occupation: Retired  Tobacco Use   Smoking status: Former   Smokeless tobacco: Never   Tobacco comments:    quit 20-30 years ago  Vaping Use   Vaping status: Never Used  Substance and Sexual Activity   Alcohol use: No   Drug use: No   Sexual activity: Not Currently  Other Topics Concern   Not on file  Social History Narrative   Not on file   Social Drivers of Health   Tobacco Use: Medium Risk (04/28/2024)   Patient History    Smoking Tobacco Use: Former    Smokeless Tobacco Use: Never    Passive Exposure: Not on [place]uary Strain: Not on file  Food Insecurity: Not on file  Transportation Needs: Not on file  Physical Activity: Not on file  Stress: Not on file  Social Connections: Not on file  Depression (PHQ2-9): Low Risk (03/21/2024)   Depression (PHQ2-9)    PHQ-2 Score: 4  Alcohol Screen: Not on file  Housing: Not on file  Utilities: Not on file  Health Literacy: Not on file    MEDICATIONS:  Current Outpatient Medications  Medication Sig Dispense Refill   aspirin  81 MG tablet Take 81 mg by mouth every evening.     budesonide-glycopyrrolate-formoterol (BREZTRI  AEROSPHERE) 160-9-4.8 MCG/ACT AERO inhaler Inhale 2 puffs into the lungs in the morning and at bedtime. 10.7 g 11   Cholecalciferol  (VITAMIN D3) 1000 UNITS CAPS Take 1 capsule by mouth every evening.     clopidogrel  (PLAVIX ) 75 MG tablet Take 1 tablet (75 mg total) by mouth daily with breakfast. 90 tablet 3   furosemide  (LASIX ) 20 MG tablet TAKE 2 TABLETS BY MOUTH AS NEEDED FOR EDEMA (SWELLING) **THIS IS A DOSE INCREASE** 60 tablet 6   glipiZIDE  (GLUCOTROL  XL) 2.5 MG 24 hr tablet Take 2.5 mg by mouth daily.     loratadine (CLARITIN) 10 MG tablet Take 10 mg by mouth daily as needed for allergies.     losartan  (COZAAR ) 25 MG tablet Take 25 mg by mouth  daily.      meclizine  (ANTIVERT ) 25 MG tablet Take 1 tablet (25 mg total) by mouth 3 (three) times daily as needed for dizziness. May cause drowsiness 30 tablet 0   metFORMIN  (GLUCOPHAGE ) 500 MG tablet Take 2 tablets (1,000 mg total) by mouth 2 (two) times daily.     metFORMIN  (GLUCOPHAGE ) 500 MG tablet Take 2 tablets (1,000 mg total) by mouth 2 (two) times daily for diabetes. 400  tablet 3   metoprolol  succinate (TOPROL -XL) 25 MG 24 hr tablet Take 1 tablet by mouth once daily 90 tablet 3   nitroGLYCERIN  (NITROSTAT ) 0.4 MG SL tablet Place 1 tablet (0.4 mg total) under the tongue every 5 (five) minutes as needed. 25 tablet 3   Propylene Glycol (SYSTANE COMPLETE OP) Apply 1 drop to eye daily.     rOPINIRole (REQUIP) 0.5 MG tablet Take by mouth.     rosuvastatin  (CRESTOR ) 20 MG tablet Take 1 tablet (20 mg total) by mouth daily. 90 tablet 3   simvastatin  (ZOCOR ) 20 MG tablet Take 1 tablet (20 mg total) by mouth daily for high cholesterol. 100 tablet 3   Turmeric 500 MG TABS Take 500 mg by mouth every evening.     White Petrolatum-Mineral Oil (SYSTANE NIGHTTIME) OINT Apply 1 Application to eye at bedtime as needed (dry eye).     zinc gluconate 50 MG tablet Take 50 mg by mouth daily.     levothyroxine  (SYNTHROID ) 88 MCG tablet Take 1 tablet (88 mcg total) by mouth daily. 90 tablet 3   No current facility-administered medications for this visit.    PHYSICAL EXAM: Vitals:   04/28/24 1318 04/28/24 1319  BP: (!) 156/70 (!) 150/72  Pulse: 84   Resp: 16   SpO2: 98%   Weight: 197 lb (89.4 kg)   Height: 5' 9 (1.753 m)    Body mass index is 29.09 kg/m.  Wt Readings from Last 3 Encounters:  04/28/24 197 lb (89.4 kg)  03/31/24 197 lb 12.8 oz (89.7 kg)  03/25/24 199 lb (90.3 kg)    General: Well developed, well nourished male in no apparent distress.  HEENT: AT/[place], no external lesions. Hearing intact to the spoken word Eyes: Conjunctiva clear and no icterus. Neck: Trachea midline, neck supple   Abdomen: Soft, non tender Neurologic: Alert, oriented, normal speech, deep tendon biceps reflexes normal 2+,  no gross focal neurological deficit Extremities: No pedal pitting edema, + tremors of outstretched hands Skin: Warm, color good. Psychiatric: Does not appear depressed or anxious  PERTINENT HISTORIC LABORATORY AND IMAGING STUDIES:  All pertinent laboratory results were reviewed. Please see HPI also for further details.   TSH  Date Value Ref Range Status  03/31/2024 1.090 0.450 - 4.500 uIU/mL Final  03/02/2023 0.33 (L) 0.35 - 5.50 uIU/mL Final  09/17/2022 0.29 (L) 0.35 - 5.50 uIU/mL Final     ASSESSMENT / PLAN  1. 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   -He has been taking levothyroxine  88 mg daily.  Dose was decreased from 100 mcg daily in November 2024. - He is clinically euthyroid.  Discussed that his complaints of fatigue, hand tremors, shortness of breath are unlikely to be related to thyroid  disorder.  He had normal TSH in December.  Advised to continue follow-up with cardiology, pulmonology and primary care provider.  Plan: -Will recheck thyroid  function test including TSH, free T4 today. -He is currently taking levothyroxine  88 mcg daily. -Patient wants to follow-up with [facility] endocrinology due to travel distance.  Referral placed.  Diagnoses and all orders for this visit:  Postablative hypothyroidism -     T4, free -     TSH -     levothyroxine  (SYNTHROID ) 88 MCG tablet; Take 1 tablet (88 mcg total) by mouth daily. -     Ambulatory referral to Endocrinology   DISPOSITION Follow up  in clinic in 12 months suggested.  Sent a referral to [facility] endocrinology going forward.  All questions answered and patient verbalized understanding of the plan.  Clarissa Laird, MD [facility] Endocrinology [facility]  [facility] [address] [place], [address] [place], [place] [address] Phone # [phone]  At least part of this note was generated using voice recognition software. Inadvertent word errors may have occurred, which were not recognized during the proofreading process.    "

## 2024-04-29 ENCOUNTER — Encounter (HOSPITAL_COMMUNITY)
Admission: RE | Admit: 2024-04-29 | Discharge: 2024-04-29 | Disposition: A | Payer: Self-pay | Source: Ambulatory Visit | Attending: Cardiology

## 2024-04-29 ENCOUNTER — Ambulatory Visit: Payer: Self-pay | Admitting: Endocrinology

## 2024-04-29 ENCOUNTER — Ambulatory Visit: Payer: Self-pay | Admitting: Cardiology

## 2024-04-29 DIAGNOSIS — Z955 Presence of coronary angioplasty implant and graft: Secondary | ICD-10-CM

## 2024-04-29 LAB — TSH: TSH: 1 m[IU]/L (ref 0.40–4.50)

## 2024-04-29 LAB — T4, FREE: Free T4: 1.4 ng/dL (ref 0.8–1.8)

## 2024-04-29 NOTE — Progress Notes (Signed)
 Daily Session Note  Patient Details  Name: Duane Stevens MRN: [id number] Date of Birth: 08-27-1945 Referring Provider:   Flowsheet Row CARDIAC REHAB PHASE II ORIENTATION from 03/21/2024 in [facility] CARDIAC REHABILITATION  Referring Provider Alvan Carrier MD    Encounter Date: 04/29/2024  Check In:  Session Check In - 04/29/24 1300       Check-In   Supervising physician immediately available to respond to emergencies See telemetry face sheet for immediately available MD    Location [facility]    Staff Present Adrien Louder, RN, BSN;Victoria Zina, RN;Heather Con, BS, Exercise Physiologist    Virtual Visit No    Medication changes reported     No    Fall or balance concerns reported    No    Warm-up and Cool-down Performed as group-led instruction    Resistance Training Performed Yes    VAD Patient? No    PAD/SET Patient? No      Pain Assessment   Currently in Pain? No/denies    Pain Score 0-No pain    Multiple Pain Sites No          Capillary Blood Glucose: Results for orders placed or performed in visit on 04/28/24 (from the past 24 hours)  T4, free     Status: None   Collection Time: 04/28/24  1:53 PM  Result Value Ref Range   Free T4 1.4 0.8 - 1.8 ng/dL  TSH     Status: None   Collection Time: 04/28/24  1:53 PM  Result Value Ref Range   TSH 1.00 0.40 - 4.50 mIU/L      Tobacco Use History[1]  Goals Met:  Independence with exercise equipment Exercise tolerated well No report of concerns or symptoms today Strength training completed today  Goals Unmet:  Not Applicable  Comments: Pt able to follow exercise prescription today without complaint.  Will continue to monitor for progression.        [1]  Social History Tobacco Use  Smoking Status Former  Smokeless Tobacco Never  Tobacco Comments   quit 20-30 years ago

## 2024-05-02 ENCOUNTER — Encounter (HOSPITAL_COMMUNITY)
Admission: RE | Admit: 2024-05-02 | Discharge: 2024-05-02 | Disposition: A | Payer: Self-pay | Source: Ambulatory Visit | Attending: Cardiology

## 2024-05-02 DIAGNOSIS — Z955 Presence of coronary angioplasty implant and graft: Secondary | ICD-10-CM

## 2024-05-02 NOTE — Progress Notes (Signed)
 Daily Session Note  Patient Details  Name: Lehi Phifer MRN: [id number] Date of Birth: [birth year] Referring Provider:   Flowsheet Row CARDIAC REHAB PHASE II ORIENTATION from 03/21/2024 in [facility] CARDIAC REHABILITATION  Referring Provider Alvan Carrier MD    Encounter Date: 05/02/2024  Check In:  Session Check In - 05/02/24 1447       Check-In   Supervising physician immediately available to respond to emergencies See telemetry face sheet for immediately available MD    Location [facility]    Staff Present Richerd Buddle, RN;Nesha Counihan Vicci, RN, BSN;Heather Con, BS, Exercise Physiologist    Virtual Visit No    Medication changes reported     No    Fall or balance concerns reported    No    Warm-up and Cool-down Not performed (comment)    Resistance Training Performed Yes    VAD Patient? No    PAD/SET Patient? No      Pain Assessment   Currently in Pain? No/denies    Multiple Pain Sites No          Capillary Blood Glucose: No results found for this or any previous visit (from the past 24 hours).    Tobacco Use History[1]  Goals Met:  Independence with exercise equipment Exercise tolerated well No report of concerns or symptoms today Strength training completed today  Goals Unmet:  Not Applicable  Comments: Pt able to follow exercise prescription today without complaint.  Will continue to monitor for progression.        [1]  Social History Tobacco Use  Smoking Status Former  Smokeless Tobacco Never  Tobacco Comments   quit 20-30 years ago

## 2024-05-04 ENCOUNTER — Encounter (HOSPITAL_COMMUNITY)
Admission: RE | Admit: 2024-05-04 | Discharge: 2024-05-04 | Disposition: A | Discharge status: Home / Self Care | Payer: Self-pay | Source: Ambulatory Visit | Attending: Cardiology | Admitting: Cardiology

## 2024-05-04 DIAGNOSIS — Z955 Presence of coronary angioplasty implant and graft: Secondary | ICD-10-CM

## 2024-05-04 NOTE — Progress Notes (Signed)
 Daily Session Note  Patient Details  Name: Duane Stevens MRN: [id number] Date of Birth: 1945-05-17 Referring Provider:   Flowsheet Row CARDIAC REHAB PHASE II ORIENTATION from 03/21/2024 in [facility] CARDIAC REHABILITATION  Referring Provider Alvan Carrier MD    Encounter Date: 05/04/2024  Check In:  Session Check In - 05/04/24 1422       Check-In   Supervising physician immediately available to respond to emergencies See telemetry face sheet for immediately available MD    Location [facility]    Staff Present Laymon Rattler, BSN, RN, WTA-C;Heather Con HECKLE, Exercise Physiologist;Takiesha Mcdevitt Dean BSN, RN    Virtual Visit No    Medication changes reported     No    Fall or balance concerns reported    No    Tobacco Cessation No Change    Warm-up and Cool-down Performed on first and last piece of equipment    Resistance Training Performed Yes    VAD Patient? No    PAD/SET Patient? No      Pain Assessment   Currently in Pain? No/denies    Pain Score 0-No pain    Multiple Pain Sites No          Capillary Blood Glucose: No results found for this or any previous visit (from the past 24 hours).    Tobacco Use History[1]  Goals Met:  Independence with exercise equipment Exercise tolerated well No report of concerns or symptoms today Strength training completed today  Goals Unmet:  Not Applicable  Comments: SABRASABRAPt able to follow exercise prescription today without complaint.  Will continue to monitor for progression.        [1]  Social History Tobacco Use  Smoking Status Former  Smokeless Tobacco Never  Tobacco Comments   quit 20-30 years ago

## 2024-05-06 ENCOUNTER — Encounter (HOSPITAL_COMMUNITY)
Admission: RE | Admit: 2024-05-06 | Discharge: 2024-05-06 | Disposition: A | Discharge status: Home / Self Care | Payer: Self-pay | Source: Ambulatory Visit | Attending: Cardiology | Admitting: Cardiology

## 2024-05-06 DIAGNOSIS — Z955 Presence of coronary angioplasty implant and graft: Secondary | ICD-10-CM

## 2024-05-06 NOTE — Progress Notes (Signed)
 Daily Session Note  Patient Details  Name: Duane Stevens MRN: [id number] Date of Birth: [birth year] Referring Provider:   Flowsheet Row CARDIAC REHAB PHASE II ORIENTATION from 03/21/2024 in [facility] CARDIAC REHABILITATION  Referring Provider Alvan Carrier MD    Encounter Date: 05/06/2024  Check In:  Session Check In - 05/06/24 1302       Check-In   Supervising physician immediately available to respond to emergencies See telemetry face sheet for immediately available MD    Location [facility]    Staff Present Adrien Louder, RN, BSN;Brittany Jackquline, BSN, RN, WTA-C;Victoria Zina, RN    Virtual Visit No    Medication changes reported     No    Fall or balance concerns reported    No    Warm-up and Cool-down Performed on first and last piece of equipment    Resistance Training Performed Yes    VAD Patient? No    PAD/SET Patient? No      Pain Assessment   Currently in Pain? No/denies    Pain Score 0-No pain    Multiple Pain Sites No          Capillary Blood Glucose: No results found for this or any previous visit (from the past 24 hours).    Tobacco Use History[1]  Goals Met:  Independence with exercise equipment Exercise tolerated well No report of concerns or symptoms today Strength training completed today  Goals Unmet:  Not Applicable  Comments: Pt able to follow exercise prescription today without complaint.  Will continue to monitor for progression.        [1]  Social History Tobacco Use  Smoking Status Former  Smokeless Tobacco Never  Tobacco Comments   quit 20-30 years ago

## 2024-05-09 ENCOUNTER — Encounter (HOSPITAL_COMMUNITY)
Admission: RE | Admit: 2024-05-09 | Discharge: 2024-05-09 | Disposition: A | Payer: Self-pay | Source: Ambulatory Visit | Attending: Cardiology

## 2024-05-09 DIAGNOSIS — Z955 Presence of coronary angioplasty implant and graft: Secondary | ICD-10-CM

## 2024-05-09 NOTE — Progress Notes (Signed)
 Daily Session Note  Patient Details  Name: Duane Stevens MRN: [id number] Date of Birth: [birth year] Referring Provider:   Flowsheet Row CARDIAC REHAB PHASE II ORIENTATION from 03/21/2024 in [facility] CARDIAC REHABILITATION  Referring Provider Alvan Carrier MD    Encounter Date: 05/09/2024  Check In:  Session Check In - 05/09/24 1429       Check-In   Supervising physician immediately available to respond to emergencies See telemetry face sheet for immediately available MD    Location [facility]    Staff Present Laymon Rattler, BSN, RN, WTA-C;Heather Con, BS, Exercise Physiologist;Chancellor Vanderloop Zina, RN    Virtual Visit No    Medication changes reported     No    Fall or balance concerns reported    No    Warm-up and Cool-down Performed on first and last piece of equipment    Resistance Training Performed Yes    VAD Patient? No    PAD/SET Patient? No      Pain Assessment   Currently in Pain? No/denies          Capillary Blood Glucose: No results found for this or any previous visit (from the past 24 hours).    Tobacco Use History[1]  Goals Met:  Independence with exercise equipment Exercise tolerated well No report of concerns or symptoms today Strength training completed today  Goals Unmet:  Not Applicable  Comments: Pt able to follow exercise prescription today without complaint.  Will continue to monitor for progression.        [1]  Social History Tobacco Use  Smoking Status Former  Smokeless Tobacco Never  Tobacco Comments   quit 20-30 years ago

## 2024-05-11 ENCOUNTER — Other Ambulatory Visit: Payer: Self-pay

## 2024-05-11 ENCOUNTER — Encounter (HOSPITAL_COMMUNITY)
Admission: RE | Admit: 2024-05-11 | Discharge: 2024-05-11 | Disposition: A | Payer: Self-pay | Source: Ambulatory Visit | Attending: Cardiology

## 2024-05-11 ENCOUNTER — Encounter: Payer: Self-pay | Admitting: Allergy & Immunology

## 2024-05-11 ENCOUNTER — Ambulatory Visit: Admitting: Allergy & Immunology

## 2024-05-11 VITALS — BP 110/80 | HR 70 | Temp 98.5°F | Resp 18 | Ht 67.72 in | Wt 197.6 lb

## 2024-05-11 DIAGNOSIS — R0602 Shortness of breath: Secondary | ICD-10-CM

## 2024-05-11 DIAGNOSIS — J310 Chronic rhinitis: Secondary | ICD-10-CM

## 2024-05-11 DIAGNOSIS — Z955 Presence of coronary angioplasty implant and graft: Secondary | ICD-10-CM

## 2024-05-11 MED ORDER — IPRATROPIUM BROMIDE 0.06 % NA SOLN: 1.0000 | Freq: Three times a day (TID) | NASAL | 5 refills | Status: AC

## 2024-05-11 NOTE — Progress Notes (Signed)
 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 Gustatory rhinitis  - You definitely have symptoms of gustatory rhinitis (which is runny nose triggered by the smell of foods). - I would recommend doing Atrovent  (ipratropium) one spray per nostril three times daily.  - Because of insurance stipulations, we cannot do skin testing on the same day as your first visit. - We are all working to fight this, but for now we need to do two separate visits.  - We will know more after we do testing at the next visit.  - The skin testing visit can be squeezed in at your convenience.  - Then we can make a more full plan to address all of your symptoms. - Be sure to stop your antihistamines for 3 days before this appointment.   2. SOB (shortness of breath) - I will send my note to Dr. Darlean to keep him in the loop. - I will defer to him regarding his asthma management.  3. Return in about 2 weeks (around 05/25/2024) for SKIN TESTING (1-55). You can have the follow up appointment with Dr. Iva or a Nurse Practicioner (our Nurse Practitioners are excellent and always have Physician oversight!).    This note in its entirety was forwarded to the Provider who requested this consultation.  Subjective:   Duane Stevens is a 79 y.o. male presenting today for evaluation of  Chief Complaint  Patient presents with   Establish Care    Pt presents to the office to establish care. Pt reports SOB started about 6 months ago and runny nose.     Duane Stevens has a history of the following: Patient Active Problem List   Diagnosis Date Noted   DOE (dyspnea on exertion) 03/31/2024   Coronary artery disease involving native coronary artery of native heart with angina pectoris 03/09/2024   Hypothyroidism 02/16/2017    Type II or unspecified type diabetes mellitus without mention of complication, not stated as uncontrolled 10/15/2010   Pure hypercholesterolemia 10/15/2010   Osteoarthritis 10/15/2010   Multinodular goiter 10/15/2010    History obtained from: chart review and patient and his wife.  Discussed the use of AI scribe software for clinical note transcription with the patient and/or guardian, who gave verbal consent to proceed.  Duane Stevens was referred by Lari Elspeth BRAVO, MD.     Duane Stevens is a 79 y.o. male presenting for an evaluation of environmental allergeis and runny nose.  Asthma/Respiratory Symptom History: He got a lung function test done two years ago. He has another one scheduled next Wednesday. He has a prescription for Breztri  but he is not sure that it helped at all. He is not really sure if that helped at all. He continues to follow with Dr. Darlean.  Looks like he saw Dr. Darlean last in mid December 2025.  At that time, he was started on Breztri .  He did have labs done which showed absolute eosinophil count 200.  His next appointment is in January 2026 next week.  He underwent a cardiac evaluation and had a stent placed due to a 75% blockage, but this did not alleviate his shortness of breath. He also experiences dizziness, which was evaluated by a thyroid  specialist, but no thyroid  issues were found.  Allergic Rhinitis Symptom History: He has  allergy symptoms for his entire life. He has used Claritin in the past. He does have a lot of runny nose. He has a nose spray that he uses only as needed. He has a lot of runny nose with eating. He has never been allergy tested in the past. He experiences a persistent runny nose, which he attributes to allergies. The nasal discharge is particularly bothersome when he eats, especially when he holds his head down. He has had this condition for a long time. He uses laronidase to manage his symptoms but has not used nasal sprays regularly. He has not  undergone allergy testing before, and there is no history of food allergies.  He has a small protrusion in his ear, noted by an ear doctor, but it does not cause pain or appear to be related to his dizziness.   Otherwise, there is no history of other atopic diseases, including drug allergies, stinging insect allergies, or contact dermatitis. There is no significant infectious history. Vaccinations are up to date.    Past Medical History: Patient Active Problem List   Diagnosis Date Noted   DOE (dyspnea on exertion) 03/31/2024   Coronary artery disease involving native coronary artery of native heart with angina pectoris 03/09/2024   Hypothyroidism 02/16/2017   Type II or unspecified type diabetes mellitus without mention of complication, not stated as uncontrolled 10/15/2010   Pure hypercholesterolemia 10/15/2010   Osteoarthritis 10/15/2010   Multinodular goiter 10/15/2010    Medication List:  Allergies as of 05/11/2024       Reactions   Bee Venom Hives, Swelling   Yellow Jacket   Penicillins Other (See Comments)   Did it involve swelling of the face/tongue/throat, SOB, or low BP? Unknown Did it involve sudden or severe rash/hives, skin peeling, or any reaction on the inside of your mouth or nose? Unknown Did you need to seek medical attention at a hospital or doctor's office? Unknown When did it last happen?   Over 10 years    If all above answers are NO, may proceed with cephalosporin use.   Oxycodone Hcl Itching        Medication List        Accurate as of May 11, 2024 12:14 PM. If you have any questions, ask your nurse or doctor.          rOPINIRole 0.5 MG tablet Commonly known as: REQUIP Take by mouth. The timing of this medication is very important.   aspirin  81 MG tablet Take 81 mg by mouth every evening.   Breztri  Aerosphere 160-9-4.8 MCG/ACT Aero inhaler Generic drug: budesonide-glycopyrrolate-formoterol Inhale 2 puffs into the lungs in the  morning and at bedtime.   clopidogrel  75 MG tablet Commonly known as: PLAVIX  Take 1 tablet (75 mg total) by mouth daily with breakfast.   furosemide  20 MG tablet Commonly known as: LASIX  TAKE 2 TABLETS BY MOUTH AS NEEDED FOR EDEMA (SWELLING) **THIS IS A DOSE INCREASE**   glipiZIDE  2.5 MG 24 hr tablet Commonly known as: GLUCOTROL  XL Take 2.5 mg by mouth daily.   ipratropium 0.06 % nasal spray Commonly known as: ATROVENT  Place 1 spray into both nostrils 3 (three) times daily. Started by: Marty Shaggy, MD   levothyroxine  88 MCG tablet Commonly known as: SYNTHROID  Take 1 tablet (88 mcg total) by mouth daily.   loratadine 10 MG tablet Commonly known as: CLARITIN Take 10 mg by mouth daily as needed for allergies.   losartan  25 MG tablet Commonly known as: COZAAR  Take  25 mg by mouth daily.   meclizine  25 MG tablet Commonly known as: ANTIVERT  Take 1 tablet (25 mg total) by mouth 3 (three) times daily as needed for dizziness. May cause drowsiness   metFORMIN  500 MG tablet Commonly known as: GLUCOPHAGE  Take 2 tablets (1,000 mg total) by mouth 2 (two) times daily for diabetes.   metFORMIN  500 MG tablet Commonly known as: GLUCOPHAGE  Take 2 tablets (1,000 mg total) by mouth 2 (two) times daily.   metoprolol  succinate 25 MG 24 hr tablet Commonly known as: TOPROL -XL Take 1 tablet by mouth once daily   nitroGLYCERIN  0.4 MG SL tablet Commonly known as: NITROSTAT  Place 1 tablet (0.4 mg total) under the tongue every 5 (five) minutes as needed.   rosuvastatin  20 MG tablet Commonly known as: CRESTOR  Take 1 tablet (20 mg total) by mouth daily.   simvastatin  20 MG tablet Commonly known as: ZOCOR  Take 1 tablet (20 mg total) by mouth daily for high cholesterol.   SYSTANE COMPLETE OP Apply 1 drop to eye daily.   Systane Nighttime Oint Apply 1 Application to eye at bedtime as needed (dry eye).   Turmeric 500 MG Tabs Take 500 mg by mouth every evening.   Vitamin D3 25 MCG  (1000 UT) Caps Take 1 capsule by mouth every evening.   zinc gluconate 50 MG tablet Take 50 mg by mouth daily.        Birth History: non-contributory  Developmental History: non-contributory  Past Surgical History: Past Surgical History:  Procedure Laterality Date   ANKLE SURGERY     BACK SURGERY     CORONARY PRESSURE/FFR WITH 3D MAPPING N/A 03/09/2024   Procedure: Coronary Pressure/FFR w/3D Mapping;  Surgeon: Jordan, Peter M, MD;  Location: [facility] INVASIVE CV LAB;  Service: Cardiovascular;  Laterality: N/A;   CORONARY STENT INTERVENTION N/A 03/09/2024   Procedure: CORONARY STENT INTERVENTION;  Surgeon: Jordan, Peter M, MD;  Location: [facility] INVASIVE CV LAB;  Service: Cardiovascular;  Laterality: N/A;   HERNIA REPAIR     LEFT HEART CATH AND CORONARY ANGIOGRAPHY N/A 03/09/2024   Procedure: LEFT HEART CATH AND CORONARY ANGIOGRAPHY;  Surgeon: Jordan, Peter M, MD;  Location: [facility] INVASIVE CV LAB;  Service: Cardiovascular;  Laterality: N/A;   PROSTATECTOMY       Family History: Family History  Problem Relation Age of Onset   Cancer Other        FH of Lung Cancer   Cancer Other        FH of Prostate Cancer   Diabetes Other        Parent   Heart disease Other        Parent     Social History: Damarkus lives at home with his family.  They live in a house that is 81 years old.  There is cement in the main living areas and laminate in the bedroom.  They avoid heating as well as electric heating and central cooling.  There are dust covers on the bed and pillows.  There is no fume, chemical, or dust exposure.  He is currently retired and used to work in [employer].  There is a HEPA filter in the home.  [place]  There is exposure to fumes, chemicals, and dust.  He was a smoker for 20 years, but quit 50 years ago   Review of systems otherwise negative other than that mentioned in the HPI.    Objective:   Blood pressure 110/80, pulse  70, temperature 98.5  F (36.9 C), temperature source Temporal, resp. rate 18, height 5' 7.72 (1.72 m), weight 197 lb 9.6 oz (89.6 kg), SpO2 98%. Body mass index is 30.3 kg/m.     Physical Exam Vitals reviewed.  Constitutional:      Appearance: He is well-developed.     Comments: Somewhat hard of hearing. Pleasant.   HENT:     Head: Normocephalic and atraumatic.     Right Ear: Tympanic membrane, ear canal and external ear normal. No drainage, swelling or tenderness. Tympanic membrane is not injected, scarred, erythematous, retracted or bulging.     Left Ear: Tympanic membrane, ear canal and external ear normal. No drainage, swelling or tenderness. Tympanic membrane is not injected, scarred, erythematous, retracted or bulging.     Nose: No nasal deformity, septal deviation, mucosal edema or rhinorrhea.     Right Turbinates: Enlarged, swollen and pale.     Left Turbinates: Enlarged, swollen and pale.     Right Sinus: No maxillary sinus tenderness or frontal sinus tenderness.     Left Sinus: No maxillary sinus tenderness or frontal sinus tenderness.     Mouth/Throat:     Mouth: Mucous membranes are not pale and not dry.     Pharynx: Uvula midline.  Eyes:     General:        Right eye: No discharge.        Left eye: No discharge.     Conjunctiva/sclera: Conjunctivae normal.     Right eye: Right conjunctiva is not injected. No chemosis.    Left eye: Left conjunctiva is not injected. No chemosis.    Pupils: Pupils are equal, round, and reactive to light.  Cardiovascular:     Rate and Rhythm: Normal rate and regular rhythm.     Heart sounds: Normal heart sounds.  Pulmonary:     Effort: Pulmonary effort is normal. No tachypnea, accessory muscle usage or respiratory distress.     Breath sounds: Normal breath sounds. No wheezing, rhonchi or rales.     Comments: Moving air well in all lung fields. No increased work of breathing noted.  Chest:     Chest wall: No tenderness.  Abdominal:     Tenderness:  There is no abdominal tenderness. There is no guarding or rebound.  Lymphadenopathy:     Head:     Right side of head: No submandibular, tonsillar or occipital adenopathy.     Left side of head: No submandibular, tonsillar or occipital adenopathy.     Cervical: No cervical adenopathy.  Skin:    Coloration: Skin is not pale.     Findings: No abrasion, erythema, petechiae or rash. Rash is not papular, urticarial or vesicular.  Neurological:     Mental Status: He is alert.  Psychiatric:        Behavior: Behavior is cooperative.      Diagnostic studies: deferred due to insurance stipulations that require a separate visit for testing          Marty Shaggy, MD [facility]       "

## 2024-05-11 NOTE — Progress Notes (Signed)
 Daily Session Note  Patient Details  Name: Duane Stevens MRN: [id number] Date of Birth: 10-15-1945 Referring Provider:   Flowsheet Row CARDIAC REHAB PHASE II ORIENTATION from 03/21/2024 in [facility] CARDIAC REHABILITATION  Referring Provider Alvan Carrier MD    Encounter Date: 05/11/2024  Check In:  Session Check In - 05/11/24 1419       Check-In   Supervising physician immediately available to respond to emergencies See telemetry face sheet for immediately available MD    Location [facility]    Staff Present Powell Benders, BS, Exercise Physiologist;Tyre Beaver Dean BSN, RN;Victoria Zina, RN    Virtual Visit No    Medication changes reported     No    Fall or balance concerns reported    No    Tobacco Cessation No Change    Warm-up and Cool-down Performed on first and last piece of equipment    Resistance Training Performed Yes    VAD Patient? No    PAD/SET Patient? No      Pain Assessment   Currently in Pain? No/denies    Pain Score 0-No pain    Multiple Pain Sites No          Capillary Blood Glucose: No results found for this or any previous visit (from the past 24 hours).    Tobacco Use History[1]  Goals Met:  Independence with exercise equipment Exercise tolerated well No report of concerns or symptoms today Strength training completed today  Goals Unmet:  Not Applicable  Comments: .Pt able to follow exercise prescription today without complaint.  Will continue to monitor for progression.       [1]  Social History Tobacco Use  Smoking Status Former  Smokeless Tobacco Never  Tobacco Comments   quit 20-30 years ago

## 2024-05-11 NOTE — Patient Instructions (Addendum)
 1. Gustatory rhinitis  - You definitely have symptoms of gustatory rhinitis (which is runny nose triggered by the smell of foods). - I would recommend doing Atrovent  (ipratropium) one spray per nostril three times daily.  - Because of insurance stipulations, we cannot do skin testing on the same day as your first visit. - We are all working to fight this, but for now we need to do two separate visits.  - We will know more after we do testing at the next visit.  - The skin testing visit can be squeezed in at your convenience.  - Then we can make a more full plan to address all of your symptoms. - Be sure to stop your antihistamines for 3 days before this appointment.   2. SOB (shortness of breath) - I will send my note to Dr. Darlean to keep him in the loop. - I will defer to him regarding his asthma management.  3. Return in about 2 weeks (around 05/25/2024) for SKIN TESTING (1-55). You can have the follow up appointment with Dr. Iva or a Nurse Practicioner (our Nurse Practitioners are excellent and always have Physician oversight!).    Please inform us  of any Emergency Department visits, hospitalizations, or changes in symptoms. Call us  before going to the ED for breathing or allergy symptoms since we might be able to fit you in for a sick visit. Feel free to contact us  anytime with any questions, problems, or concerns.  It was a pleasure to meet you and your family today!  Websites that have reliable patient information: 1. American Academy of Asthma, Allergy, and Immunology: www.aaaai.org 2. Food Allergy Research and Education (FARE): foodallergy.org 3. Mothers of Asthmatics: http://www.asthmacommunitynetwork.org 4. American College of Allergy, Asthma, and Immunology: www.acaai.org      Like us  on [employer] and Instagram for our latest updates!      A healthy democracy works best when [employer] participate! Make sure you are registered to vote! If you have moved or changed  any of your contact information, you will need to get this updated before voting! Scan the QR codes below to learn more!

## 2024-05-13 ENCOUNTER — Encounter (HOSPITAL_COMMUNITY)
Admission: RE | Admit: 2024-05-13 | Discharge: 2024-05-13 | Disposition: A | Payer: Self-pay | Source: Ambulatory Visit | Attending: Cardiology

## 2024-05-13 ENCOUNTER — Telehealth: Payer: Self-pay | Admitting: Internal Medicine

## 2024-05-13 DIAGNOSIS — Z955 Presence of coronary angioplasty implant and graft: Secondary | ICD-10-CM

## 2024-05-13 NOTE — Telephone Encounter (Signed)
 Called patient regarding rescheduling due to weather----Duane Stevens states they will wait and see how the weather is on Tuesday and call that morning if they cannot make it

## 2024-05-13 NOTE — Progress Notes (Signed)
 Daily Session Note  Patient Details  Name: Duane Stevens MRN: [id number] Date of Birth: 28-Jun-1945 Referring Provider:   Flowsheet Row CARDIAC REHAB PHASE II ORIENTATION from 03/21/2024 in [facility]  Referring Provider Alvan Carrier MD    Encounter Date: 05/13/2024  Check In:  Session Check In - 05/13/24 1314       Check-In   Supervising physician immediately available to respond to emergencies See telemetry face sheet for immediately available MD    Location [facility]    Staff Present Laymon Rattler, BSN, RN, WTA-C;Heather Con, BS, Exercise Physiologist;Victoria Zina, RN    Virtual Visit No    Medication changes reported     No    Fall or balance concerns reported    No    Tobacco Cessation No Change    Warm-up and Cool-down Performed on first and last piece of equipment    Resistance Training Performed Yes    VAD Patient? No    PAD/SET Patient? No      Pain Assessment   Currently in Pain? No/denies          Capillary Blood Glucose: No results found for this or any previous visit (from the past 24 hours).    Tobacco Use History[1]  Goals Met:  Independence with exercise equipment Exercise tolerated well No report of concerns or symptoms today Strength training completed today  Goals Unmet:  Not Applicable  Comments: Pt able to follow exercise prescription today without complaint.  Will continue to monitor for progression.        [1]  Social History Tobacco Use  Smoking Status Former  Smokeless Tobacco Never  Tobacco Comments   quit 20-30 years ago

## 2024-05-16 ENCOUNTER — Encounter (HOSPITAL_COMMUNITY): Payer: Self-pay

## 2024-05-17 ENCOUNTER — Ambulatory Visit (HOSPITAL_COMMUNITY): Admission: RE | Admit: 2024-05-17 | Source: Ambulatory Visit

## 2024-05-17 ENCOUNTER — Encounter (HOSPITAL_COMMUNITY): Payer: Self-pay | Admitting: *Deleted

## 2024-05-17 ENCOUNTER — Ambulatory Visit: Admitting: Internal Medicine

## 2024-05-17 DIAGNOSIS — Z955 Presence of coronary angioplasty implant and graft: Secondary | ICD-10-CM

## 2024-05-17 DIAGNOSIS — R0609 Other forms of dyspnea: Secondary | ICD-10-CM

## 2024-05-17 NOTE — Progress Notes (Unsigned)
 "   Duane Stevens, male    DOB: Sep 06, 1945    MRN: [id number]   Brief patient profile:  89  yowm  with chronic sinusitis quit smoking around 1984 and quit Filbert Kuba exp 2007  referred to pulmonary clinic in [place]  03/31/2024 by Thom Ross  for doe x  2023  but knees are also limiting   Pt not previously seen by PCCM service.     History of Present Illness  03/31/2024  Pulmonary/ 1st office eval/ Treyvin Glidden / [place] [facility]  Chief Complaint  Patient presents with   Establish Care    DOE   Dyspnea:  100 ft slt uphill cto shed where he has wood stove  Cough: none / some pnds all his life some better with clariton. Sleep: blocks and one pillow  SABA use: none  02: none   Patient Instructions  Breztri  Take 2 puffs first thing in am  Work on inhaler technique If you notice improvement from breztri  we can call it in for you or wait until you return in 6 weeks to decide better choice if not much better Please schedule a follow up office visit in 6 weeks, call sooner if needed  PFT's on return   Allergy screen 03/31/2024 >  Eos 0.2 /  IgE  285    05/17/2024  f/u ov/[place] office/George Alcantar re: DOE maint on ***  No chief complaint on file.   Dyspnea:  *** Cough: *** Sleeping: ***   resp cc  SABA use: *** 02: ***  Lung cancer screening: ***   No obvious day to day or daytime variability or assoc excess/ purulent sputum or mucus plugs or hemoptysis or cp or chest tightness, subjective wheeze or overt sinus or hb symptoms.    Also denies any obvious fluctuation of symptoms with weather or environmental changes or other aggravating or alleviating factors except as outlined above   No unusual exposure hx or h/o childhood pna/ asthma or knowledge of premature birth.  Current Allergies, Complete Past Medical History, Past Surgical History, Family History, and Social History were reviewed in [name] record.  ROS  The following are not active complaints  unless bolded Hoarseness, sore throat, dysphagia, dental problems, itching, sneezing,  nasal congestion or discharge of excess mucus or purulent secretions, ear ache,   fever, chills, sweats, unintended wt loss or wt gain, classically pleuritic or exertional cp,  orthopnea pnd or arm/hand swelling  or leg swelling, presyncope, palpitations, abdominal pain, anorexia, nausea, vomiting, diarrhea  or change in bowel habits or change in bladder habits, change in stools or change in urine, dysuria, hematuria,  rash, arthralgias, visual complaints, headache, numbness, weakness or ataxia or problems with walking or coordination,  change in mood or  memory.         Outpatient Medications Prior to Visit  Medication Sig Dispense Refill   aspirin  81 MG tablet Take 81 mg by mouth every evening.     budesonide-glycopyrrolate-formoterol (BREZTRI  AEROSPHERE) 160-9-4.8 MCG/ACT AERO inhaler Inhale 2 puffs into the lungs in the morning and at bedtime. 10.7 g 11   Cholecalciferol  (VITAMIN D3) 1000 UNITS CAPS Take 1 capsule by mouth every evening.     clopidogrel  (PLAVIX ) 75 MG tablet Take 1 tablet (75 mg total) by mouth daily with breakfast. 90 tablet 3   furosemide  (LASIX ) 20 MG tablet TAKE 2 TABLETS BY MOUTH AS NEEDED FOR EDEMA (SWELLING) **THIS IS A DOSE INCREASE** 60 tablet 6   glipiZIDE  (GLUCOTROL  XL)  2.5 MG 24 hr tablet Take 2.5 mg by mouth daily.     ipratropium (ATROVENT ) 0.06 % nasal spray Place 1 spray into both nostrils 3 (three) times daily. 15 mL 5   levothyroxine  (SYNTHROID ) 88 MCG tablet Take 1 tablet (88 mcg total) by mouth daily. 90 tablet 3   loratadine (CLARITIN) 10 MG tablet Take 10 mg by mouth daily as needed for allergies.     losartan  (COZAAR ) 25 MG tablet Take 25 mg by mouth daily.      meclizine  (ANTIVERT ) 25 MG tablet Take 1 tablet (25 mg total) by mouth 3 (three) times daily as needed for dizziness. May cause drowsiness 30 tablet 0   metFORMIN  (GLUCOPHAGE ) 500 MG tablet Take 2 tablets (1,000  mg total) by mouth 2 (two) times daily.     metFORMIN  (GLUCOPHAGE ) 500 MG tablet Take 2 tablets (1,000 mg total) by mouth 2 (two) times daily for diabetes. 400 tablet 3   metoprolol  succinate (TOPROL -XL) 25 MG 24 hr tablet Take 1 tablet by mouth once daily 90 tablet 3   nitroGLYCERIN  (NITROSTAT ) 0.4 MG SL tablet Place 1 tablet (0.4 mg total) under the tongue every 5 (five) minutes as needed. 25 tablet 3   Propylene Glycol (SYSTANE COMPLETE OP) Apply 1 drop to eye daily.     rOPINIRole (REQUIP) 0.5 MG tablet Take by mouth.     rosuvastatin  (CRESTOR ) 20 MG tablet Take 1 tablet (20 mg total) by mouth daily. 90 tablet 3   simvastatin  (ZOCOR ) 20 MG tablet Take 1 tablet (20 mg total) by mouth daily for high cholesterol. 100 tablet 3   Turmeric 500 MG TABS Take 500 mg by mouth every evening.     White Petrolatum-Mineral Oil (SYSTANE NIGHTTIME) OINT Apply 1 Application to eye at bedtime as needed (dry eye).     zinc gluconate 50 MG tablet Take 50 mg by mouth daily.     No facility-administered medications prior to visit.         Past Medical History:  Diagnosis Date   Allergy    Arthritis    Diabetes mellitus    Hypercholesterolemia    Hypertension    Hypothyroidism    Prostate cancer (HCC)    hx of   Thyroid  disease       Objective:     Wt Readings from Last 3 Encounters:  05/11/24 197 lb 9.6 oz (89.6 kg)  04/28/24 197 lb (89.4 kg)  03/31/24 197 lb 12.8 oz (89.7 kg)    Vital signs reviewed  05/17/2024  - Note at rest 02 sats  ***% on ***   General appearance:    ***          HEENT : Oropharynx  clear/ edentulous   Min barr ***    I personally reviewed images and agree with radiology impression as follows:   Chest CT coronary    03/01/24 Subsegmental atelectasis/scarring in the lower lobes. The previously described pulmonary nodules are not definitively seen on the current exam. Minimal subpleural scarring in the anterior right middle lobe. No pleural fluid. The  included airways are patent.        IgE   03/31/2024         285    Assessment           "

## 2024-05-17 NOTE — Progress Notes (Signed)
 Cardiac Individual Treatment Plan  Patient Details  Name: Duane Stevens MRN: [id number] Date of Birth: 1945-10-25 Referring Provider:   Flowsheet Row CARDIAC REHAB PHASE II ORIENTATION from 03/21/2024 in [facility] CARDIAC REHABILITATION  Referring Provider Alvan Carrier MD    Initial Encounter Date:  Flowsheet Row CARDIAC REHAB PHASE II ORIENTATION from 03/21/2024 in [facility] CARDIAC REHABILITATION  Date 03/21/24    Visit Diagnosis: Status post coronary artery stent placement  Patient's Home Medications on Admission: Current Medications[1]  Past Medical History: Past Medical History:  Diagnosis Date   Allergy    Arthritis    Diabetes mellitus    Hypercholesterolemia    Hypertension    Hypothyroidism    Prostate cancer (HCC)    hx of   Thyroid  disease     Tobacco Use: Tobacco Use History[2]  Labs: Review Flowsheet       Latest Ref Rng & Units 03/04/2024  Labs for ITP Cardiac and Pulmonary Rehab  Cholestrol 100 - 199 mg/dL 848   LDL (calc) 0 - 99 mg/dL 80   HDL-C >60 mg/dL 56   Trlycerides 0 - 850 mg/dL 76     Capillary Blood Glucose: Lab Results  Component Value Date   GLUCAP 186 (H) 04/06/2024   GLUCAP 206 (H) 04/06/2024   GLUCAP 254 (H) 04/01/2024   GLUCAP 143 (H) 04/01/2024   GLUCAP 133 (H) 03/30/2024     Exercise Target Goals: Exercise Program Goal: Individual exercise prescription set using results from initial 6 min walk test and THRR while considering  patients activity barriers and safety.   Exercise Prescription Goal: Starting with aerobic activity 30 plus minutes a day, 3 days per week for initial exercise prescription. Provide home exercise prescription and guidelines that participant acknowledges understanding prior to discharge.  Activity Barriers & Risk Stratification:  Activity Barriers & Cardiac Risk Stratification - 03/14/24 1352       Activity Barriers & Cardiac Risk Stratification   Activity Barriers Balance  Concerns;Arthritis;Assistive Device   Uses cane and walker.   Cardiac Risk Stratification Moderate          6 Minute Walk:  6 Minute Walk     Row Name 03/21/24 1542         6 Minute Walk   Phase Initial     Distance 670 feet     Walk Time 6 minutes     # of Rest Breaks 0     MPH 1.26     METS 1.29     RPE 15     Perceived Dyspnea  3     VO2 Peak 4.53     Symptoms Yes (comment)     Comments SOB, knee pain 3/10     Resting HR 66 bpm     Resting BP 112/66     Resting Oxygen Saturation  95 %     Exercise Oxygen Saturation  during 6 min walk 89 %     Max Ex. HR 99 bpm     Max Ex. BP 146/72     2 Minute Post BP 122/60        Oxygen Initial Assessment:   Oxygen Re-Evaluation:   Oxygen Discharge (Final Oxygen Re-Evaluation):   Initial Exercise Prescription:  Initial Exercise Prescription - 03/21/24 1600       Date of Initial Exercise RX and Referring Provider   Date 03/21/24    Referring Provider Alvan Carrier MD      Oxygen   Maintain Oxygen  Saturation 88% or higher      NuStep   Level 1    SPM 80    Minutes 15    METs 2      Arm Ergometer   Level 1    RPM 50    Minutes 15    METs 2      Prescription Details   Frequency (times per week) 3    Duration Progress to 30 minutes of continuous aerobic without signs/symptoms of physical distress      Intensity   THRR 40-80% of Max Heartrate 96-125    Ratings of Perceived Exertion 11-13    Perceived Dyspnea 0-4      Progression   Progression Continue to progress workloads to maintain intensity without signs/symptoms of physical distress.      Resistance Training   Training Prescription Yes    Weight 3 lb    Reps 10-15          Perform Capillary Blood Glucose checks as needed.  Exercise Prescription Changes:   Exercise Prescription Changes     Row Name 03/21/24 1600 04/08/24 1500 04/27/24 1500         Response to Exercise   Blood Pressure (Admit) 112/66 136/80 142/64     Blood  Pressure (Exercise) 146/72 140/70 --     Blood Pressure (Exit) 122/60 130/60 142/86     Heart Rate (Admit) 66 bpm 70 bpm 76 bpm     Heart Rate (Exercise) 99 bpm 106 bpm 96 bpm     Heart Rate (Exit) 76 bpm 77 bpm 81 bpm     Oxygen Saturation (Admit) 95 % -- --     Oxygen Saturation (Exercise) 89 % -- --     Rating of Perceived Exertion (Exercise) 15 15 12      Perceived Dyspnea (Exercise) 3 -- --     Symptoms SOB, knee pain 3/10 -- --     Comments walk test results -- --     Duration -- Continue with 30 min of aerobic exercise without signs/symptoms of physical distress. Continue with 30 min of aerobic exercise without signs/symptoms of physical distress.     Intensity -- THRR unchanged THRR unchanged       Progression   Progression -- Continue to progress workloads to maintain intensity without signs/symptoms of physical distress. Continue to progress workloads to maintain intensity without signs/symptoms of physical distress.       Resistance Training   Weight -- 3 3     Reps -- 10-15 10-15       NuStep   Level -- 1 3     SPM -- 41 56     Minutes -- 15 15     METs -- 1.7 1.8       Arm Ergometer   Level -- 1 --     RPM -- 45 --     Minutes -- 15 --     METs -- 1.8 --       Recumbant Elliptical   Level -- -- 3     RPM -- -- 39     Minutes -- -- 15     METs -- -- 2.1        Exercise Comments:   Exercise Comments     Row Name 03/30/24 1421           Exercise Comments First full day of exercise!  Patient was oriented to gym and equipment including functions, settings, policies, and procedures.  Patient's individual exercise prescription and treatment plan were reviewed.  All starting workloads were established based on the results of the 6 minute walk test done at initial orientation visit.  The plan for exercise progression was also introduced and progression will be customized based on patient's performance and goals.          Exercise Goals and Review:   Exercise  Goals     Row Name 03/21/24 1606             Exercise Goals   Increase Physical Activity Yes       Intervention Provide advice, education, support and counseling about physical activity/exercise needs.;Develop an individualized exercise prescription for aerobic and resistive training based on initial evaluation findings, risk stratification, comorbidities and participant's personal goals.       Expected Outcomes Short Term: Attend rehab on a regular basis to increase amount of physical activity.;Long Term: Exercising regularly at least 3-5 days a week.;Long Term: Add in home exercise to make exercise part of routine and to increase amount of physical activity.       Increase Strength and Stamina Yes       Intervention Provide advice, education, support and counseling about physical activity/exercise needs.;Develop an individualized exercise prescription for aerobic and resistive training based on initial evaluation findings, risk stratification, comorbidities and participant's personal goals.       Expected Outcomes Short Term: Increase workloads from initial exercise prescription for resistance, speed, and METs.;Short Term: Perform resistance training exercises routinely during rehab and add in resistance training at home;Long Term: Improve cardiorespiratory fitness, muscular endurance and strength as measured by increased METs and functional capacity ( )       Able to understand and use rate of perceived exertion (RPE) scale Yes       Intervention Provide education and explanation on how to use RPE scale       Expected Outcomes Short Term: Able to use RPE daily in rehab to express subjective intensity level;Long Term:  Able to use RPE to guide intensity level when exercising independently       Able to understand and use Dyspnea scale Yes       Intervention Provide education and explanation on how to use Dyspnea scale       Expected Outcomes Short Term: Able to use Dyspnea scale daily in  rehab to express subjective sense of shortness of breath during exertion;Long Term: Able to use Dyspnea scale to guide intensity level when exercising independently       Knowledge and understanding of Target Heart Rate Range (THRR) Yes       Intervention Provide education and explanation of THRR including how the numbers were predicted and where they are located for reference       Expected Outcomes Short Term: Able to state/look up THRR;Long Term: Able to use THRR to govern intensity when exercising independently;Short Term: Able to use daily as guideline for intensity in rehab       Able to check pulse independently Yes       Intervention Provide education and demonstration on how to check pulse in carotid and radial arteries.;Review the importance of being able to check your own pulse for safety during independent exercise       Expected Outcomes Short Term: Able to explain why pulse checking is important during independent exercise;Long Term: Able to check pulse independently and accurately       Understanding of Exercise Prescription Yes  Intervention Provide education, explanation, and written materials on patient's individual exercise prescription       Expected Outcomes Short Term: Able to explain program exercise prescription;Long Term: Able to explain home exercise prescription to exercise independently          Exercise Goals Re-Evaluation :  Exercise Goals Re-Evaluation     Row Name 03/30/24 1422 04/11/24 1455 04/28/24 0828 05/09/24 1445       Exercise Goal Re-Evaluation   Exercise Goals Review Able to understand and use rate of perceived exertion (RPE) scale;Knowledge and understanding of Target Heart Rate Range (THRR) Increase Physical Activity;Able to understand and use rate of perceived exertion (RPE) scale;Knowledge and understanding of Target Heart Rate Range (THRR);Understanding of Exercise Prescription;Increase Strength and Stamina;Able to understand and use Dyspnea  scale;Able to check pulse independently Increase Physical Activity;Increase Strength and Stamina;Understanding of Exercise Prescription Increase Physical Activity;Increase Strength and Stamina;Understanding of Exercise Prescription    Comments Reviewed RPE and dyspnea scale, THR and program prescription with pt today.  Pt voiced understanding and was given a copy of goals to take home. Janelle is doing great in rehab! Talon notes that he only exercises here in rehab. He doesn't really seem to mind, he notes he doesn't really care to exercise. Mentioned maybe finding somewhere him and his wife would like to walk, and try to do that a few days a week. Jaison is doing well in rehab. He has changed from using the arm erogmeter to useing the XR. He has done well with this and its is a higher difficult level for him. Will continue to monitor and progress as abl Graciano is doing well in rehab. He is using the XR and Nustep and increasing levels as tolerated. He doesn't do any home exercise as he states he sits in the recliner a lot.    Expected Outcomes Short: Use RPE daily to regulate intensity.  Long: Follow program prescription in THR. Short: continue to attend rehab. Long: try to incorporate more walking into daily routines. Short: continue to attend rehab   long: continue to exericse with daily walking Short: continue to attend rehab. Long: Try to incorporate exercise at home.        Discharge Exercise Prescription (Final Exercise Prescription Changes):  Exercise Prescription Changes - 04/27/24 1500       Response to Exercise   Blood Pressure (Admit) 142/64    Blood Pressure (Exit) 142/86    Heart Rate (Admit) 76 bpm    Heart Rate (Exercise) 96 bpm    Heart Rate (Exit) 81 bpm    Rating of Perceived Exertion (Exercise) 12    Duration Continue with 30 min of aerobic exercise without signs/symptoms of physical distress.    Intensity THRR unchanged      Progression   Progression Continue to progress  workloads to maintain intensity without signs/symptoms of physical distress.      Resistance Training   Weight 3    Reps 10-15      NuStep   Level 3    SPM 56    Minutes 15    METs 1.8      Recumbant Elliptical   Level 3    RPM 39    Minutes 15    METs 2.1          Nutrition:  Target Goals: Understanding of nutrition guidelines, daily intake of sodium 1500mg , cholesterol 200mg , calories 30% from fat and 7% or less from saturated fats, daily to have 5 or  more servings of fruits and vegetables.  Biometrics:  Pre Biometrics - 03/21/24 1607       Pre Biometrics   Height 5' 9 (1.753 m)    Weight 197 lb 3.2 oz (89.4 kg)    Waist Circumference 43 inches    Hip Circumference 42 inches    Waist to Hip Ratio 1.02 %    BMI (Calculated) 29.11    Grip Strength 28.1 kg    Single Leg Stand 0 seconds           Nutrition Therapy Plan and Nutrition Goals:  Nutrition Therapy & Goals - 03/21/24 1607       Intervention Plan   Intervention Prescribe, educate and counsel regarding individualized specific dietary modifications aiming towards targeted core components such as weight, hypertension, lipid management, diabetes, heart failure and other comorbidities.;Nutrition handout(s) given to patient.    Expected Outcomes Short Term Goal: Understand basic principles of dietary content, such as calories, fat, sodium, cholesterol and nutrients.;Long Term Goal: Adherence to prescribed nutrition plan.          Nutrition Assessments:  MEDIFICTS Score Key: >=70 Need to make dietary changes  40-70 Heart Healthy Diet <= 40 Therapeutic Level Cholesterol Diet  Flowsheet Row CARDIAC REHAB PHASE II ORIENTATION from 03/21/2024 in [facility] CARDIAC REHABILITATION  Picture Your Plate Total Score on Admission 51   Picture Your Plate Scores: <59 Unhealthy dietary pattern with much room for improvement. 41-50 Dietary pattern unlikely to meet recommendations for good health and room for  improvement. 51-60 More healthful dietary pattern, with some room for improvement.  >60 Healthy dietary pattern, although there may be some specific behaviors that could be improved.    Nutrition Goals Re-Evaluation:  Nutrition Goals Re-Evaluation     Row Name 04/11/24 1449 05/09/24 1442           Goals   Nutrition Goal Healthy eating Healthy eating      Comment Lillard notes he does not follow any diet whatsoever. Jsoeph and his wife eat out mostly, they will split combos. Chasten notes he doesn't really watch his salt intake, but he has really cut out a lot of red meat, he eats mostly chicken now. Panagiotis is also diabetic, but doesn't watch his carb intake. Chong states he does not follow a diet. He eats whatever he can get ahold of. He doesn't drink much water  at all, he drinks a lot of unsweet tea. Encouraged to increase water  intake and watch sodium and carbs since he is a diabetic.      Expected Outcome Short: try to cook and eat more meals at home. Long: monitor salt and carb intake better. Short: Increase water  intake. Long: Monitor salt and carb intake.         Nutrition Goals Discharge (Final Nutrition Goals Re-Evaluation):  Nutrition Goals Re-Evaluation - 05/09/24 1442       Goals   Nutrition Goal Healthy eating    Comment Geron states he does not follow a diet. He eats whatever he can get ahold of. He doesn't drink much water  at all, he drinks a lot of unsweet tea. Encouraged to increase water  intake and watch sodium and carbs since he is a diabetic.    Expected Outcome Short: Increase water  intake. Long: Monitor salt and carb intake.          Psychosocial: Target Goals: Acknowledge presence or absence of significant depression and/or stress, maximize coping skills, provide positive support system. Participant is able to verbalize  types and ability to use techniques and skills needed for reducing stress and depression.  Initial Review & Psychosocial Screening:  Initial  Psych Review & Screening - 03/14/24 1405       Initial Review   Current issues with None Identified      Family Dynamics   Good Support System? Yes    Comments Patient's wife is his main support.      Barriers   Psychosocial barriers to participate in program The patient should benefit from training in stress management and relaxation.;Psychosocial barriers identified (see note)      Screening Interventions   Interventions Provide feedback about the scores to participant;Encouraged to exercise    Expected Outcomes Short Term goal: Utilizing psychosocial counselor, staff and physician to assist with identification of specific Stressors or current issues interfering with healing process. Setting desired goal for each stressor or current issue identified.;Long Term Goal: Stressors or current issues are controlled or eliminated.;Short Term goal: Identification and review with participant of any Quality of Life or Depression concerns found by scoring the questionnaire.;Long Term goal: The participant improves quality of Life and PHQ9 Scores as seen by post scores and/or verbalization of changes          Quality of Life Scores:  Quality of Life - 03/21/24 1607       Quality of Life   Select Quality of Life      Quality of Life Scores   Health/Function Pre 23.46 %    Socioeconomic Pre 25.5 %    Psych/Spiritual Pre 29.64 %    Family Pre 26.4 %    GLOBAL Pre 25.66 %         Scores of 19 and below usually indicate a poorer quality of life in these areas.  A difference of  2-3 points is a clinically meaningful difference.  A difference of 2-3 points in the total score of the Quality of Life Index has been associated with significant improvement in overall quality of life, self-image, physical symptoms, and general health in studies assessing change in quality of life.  PHQ-9: Review Flowsheet       03/21/2024  Depression screen PHQ 2/9  Decreased Interest 0  Down, Depressed,  Hopeless 0  PHQ - 2 Score 0  Altered sleeping 0  Tired, decreased energy 2  Change in appetite 0  Feeling bad or failure about yourself  0  Trouble concentrating 0  Moving slowly or fidgety/restless 2  Suicidal thoughts 0  PHQ-9 Score 4  Difficult doing work/chores Somewhat difficult   Interpretation of Total Score  Total Score Depression Severity:  1-4 = Minimal depression, 5-9 = Mild depression, 10-14 = Moderate depression, 15-19 = Moderately severe depression, 20-27 = Severe depression   Psychosocial Evaluation and Intervention:  Psychosocial Evaluation - 03/14/24 1406       Psychosocial Evaluation & Interventions   Interventions Stress management education;Relaxation education;Encouraged to exercise with the program and follow exercise prescription    Comments Patient was referred to cardiac rehab with stent placement 03/09/24. I spoke with his wife. He says she does better with answering questions. She denies any depression or anxiety. She reports he sleeps okay at night. He is retired but still remains active. He enjoys working with wood. She says he continues to have some bleeding from his catheterization site/right wrist and would like to follow up with Dr. Alvan on 12/5 before he starts the program. She is good with proceeding with his orientation visit 12/1. His  goals for the program are to improve his mobility overall; improve his balance and walking. He uses a cane or a walker for ambulation. He has vertigo at times and he uses a walker with these episodes. He has no barriers identified to complete the program.    Expected Outcomes Short Term: Patient will start the program and attend consistently. Long Term: Patient will complete the program meeting personal goals.    Continue Psychosocial Services  Follow up required by staff          Psychosocial Re-Evaluation:  Psychosocial Re-Evaluation     Row Name 04/11/24 1447 05/09/24 1440           Psychosocial  Re-Evaluation   Current issues with None Identified Current Sleep Concerns      Comments Nisaiah doesn't complain of any stress. He also seems very stress free in class, always talkative and friendly. Lamar notes he doesn't have any trouble sleeping but he does wake up a couple of times per night to use the bathrom (fluid pills) but goes right back to sleep. Brysyn doesn't complain of any stressors at the moment. He does have some issues sleeping as he wakes up a lot to urinate during the night and he also periodically wears oxygen to sleep. When he wears that at night he feels he sleeps better.      Expected Outcomes Short: continue to attend rehab. Long: continue to have healthy stress outlets. Short: Continue to attend rehab. Long: Continue to have healthy stress outlets.      Interventions Encouraged to attend Cardiac Rehabilitation for the exercise Relaxation education;Encouraged to attend Cardiac Rehabilitation for the exercise      Continue Psychosocial Services  Follow up required by staff Follow up required by staff         Psychosocial Discharge (Final Psychosocial Re-Evaluation):  Psychosocial Re-Evaluation - 05/09/24 1440       Psychosocial Re-Evaluation   Current issues with Current Sleep Concerns    Comments Dai doesn't complain of any stressors at the moment. He does have some issues sleeping as he wakes up a lot to urinate during the night and he also periodically wears oxygen to sleep. When he wears that at night he feels he sleeps better.    Expected Outcomes Short: Continue to attend rehab. Long: Continue to have healthy stress outlets.    Interventions Relaxation education;Encouraged to attend Cardiac Rehabilitation for the exercise    Continue Psychosocial Services  Follow up required by staff          Vocational Rehabilitation: Provide vocational rehab assistance to qualifying candidates.   Vocational Rehab Evaluation & Intervention:  Vocational Rehab - 03/14/24  1405       Initial Vocational Rehab Evaluation & Intervention   Assessment shows need for Vocational Rehabilitation No      Vocational Rehab Re-Evaulation   Comments Retired.          Education: Education Goals: Education classes will be provided on a weekly basis, covering required topics. Participant will state understanding/return demonstration of topics presented.  Learning Barriers/Preferences:  Learning Barriers/Preferences - 03/14/24 1405       Learning Barriers/Preferences   Learning Barriers None    Learning Preferences Skilled Demonstration;Computer/Internet          Education Topics: Hypertension, Hypertension Reduction -Define heart disease and high blood pressure. Discus how high blood pressure affects the body and ways to reduce high blood pressure.   Exercise and Your Heart -Discuss why it  is important to exercise, the FITT principles of exercise, normal and abnormal responses to exercise, and how to exercise safely.   Angina -Discuss definition of angina, causes of angina, treatment of angina, and how to decrease risk of having angina.   Cardiac Medications -Review what the following cardiac medications are used for, how they affect the body, and side effects that may occur when taking the medications.  Medications include Aspirin , Beta blockers, calcium  channel blockers, ACE Inhibitors, angiotensin receptor blockers, diuretics, digoxin, and antihyperlipidemics.   Congestive Heart Failure -Discuss the definition of CHF, how to live with CHF, the signs and symptoms of CHF, and how keep track of weight and sodium intake.   Heart Disease and Intimacy -Discus the effect sexual activity has on the heart, how changes occur during intimacy as we age, and safety during sexual activity.   Smoking Cessation / COPD -Discuss different methods to quit smoking, the health benefits of quitting smoking, and the definition of COPD.   Nutrition I: Fats -Discuss  the types of cholesterol, what cholesterol does to the heart, and how cholesterol levels can be controlled.   Nutrition II: Labels -Discuss the different components of food labels and how to read food label   Heart Parts/Heart Disease and PAD -Discuss the anatomy of the heart, the pathway of blood circulation through the heart, and these are affected by heart disease.   Stress I: Signs and Symptoms -Discuss the causes of stress, how stress may lead to anxiety and depression, and ways to limit stress.   Stress II: Relaxation -Discuss different types of relaxation techniques to limit stress.   Warning Signs of Stroke / TIA -Discuss definition of a stroke, what the signs and symptoms are of a stroke, and how to identify when someone is having stroke.   Knowledge Questionnaire Score:  Knowledge Questionnaire Score - 03/21/24 1608       Knowledge Questionnaire Score   Pre Score 21/26          Core Components/Risk Factors/Patient Goals at Admission:  Personal Goals and Risk Factors at Admission - 03/21/24 1608       Core Components/Risk Factors/Patient Goals on Admission    Weight Management Yes;Weight Maintenance;Weight Loss    Intervention Weight Management: Develop a combined nutrition and exercise program designed to reach desired caloric intake, while maintaining appropriate intake of nutrient and fiber, sodium and fats, and appropriate energy expenditure required for the weight goal.;Weight Management: Provide education and appropriate resources to help participant work on and attain dietary goals.    Admit Weight 197 lb 3.2 oz (89.4 kg)    Goal Weight: Short Term 195 lb (88.5 kg)    Goal Weight: Long Term 193 lb (87.5 kg)    Expected Outcomes Short Term: Continue to assess and modify interventions until short term weight is achieved;Long Term: Adherence to nutrition and physical activity/exercise program aimed toward attainment of established weight goal;Weight  Maintenance: Understanding of the daily nutrition guidelines, which includes 25-35% calories from fat, 7% or less cal from saturated fats, less than 200mg  cholesterol, less than 1.5gm of sodium, & 5 or more servings of fruits and vegetables daily;Weight Loss: Understanding of general recommendations for a balanced deficit meal plan, which promotes 1-2 lb weight loss per week and includes a negative energy balance of [phone] kcal/d;Understanding recommendations for meals to include 15-35% energy as protein, 25-35% energy from fat, 35-60% energy from carbohydrates, less than 200mg  of dietary cholesterol, 20-35 gm of total fiber daily;Understanding of distribution  of calorie intake throughout the day with the consumption of 4-5 meals/snacks    Improve shortness of breath with ADL's Yes    Intervention Provide education, individualized exercise plan and daily activity instruction to help decrease symptoms of SOB with activities of daily living.    Expected Outcomes Short Term: Improve cardiorespiratory fitness to achieve a reduction of symptoms when performing ADLs;Long Term: Be able to perform more ADLs without symptoms or delay the onset of symptoms    Diabetes Yes    Intervention Provide education about signs/symptoms and action to take for hypo/hyperglycemia.;Provide education about proper nutrition, including hydration, and aerobic/resistive exercise prescription along with prescribed medications to achieve blood glucose in normal ranges: Fasting glucose 65-99 mg/dL    Expected Outcomes Short Term: Participant verbalizes understanding of the signs/symptoms and immediate care of hyper/hypoglycemia, proper foot care and importance of medication, aerobic/resistive exercise and nutrition plan for blood glucose control.;Long Term: Attainment of HbA1C < 7%.    Hypertension Yes    Intervention Provide education on lifestyle modifcations including regular physical activity/exercise, weight management, moderate  sodium restriction and increased consumption of fresh fruit, vegetables, and low fat dairy, alcohol moderation, and smoking cessation.;Monitor prescription use compliance.    Expected Outcomes Short Term: Continued assessment and intervention until BP is < 140/37mm HG in hypertensive participants. < 130/24mm HG in hypertensive participants with diabetes, heart failure or chronic kidney disease.;Long Term: Maintenance of blood pressure at goal levels.    Lipids Yes    Intervention Provide education and support for participant on nutrition & aerobic/resistive exercise along with prescribed medications to achieve LDL 70mg , HDL >40mg .    Expected Outcomes Short Term: Participant states understanding of desired cholesterol values and is compliant with medications prescribed. Participant is following exercise prescription and nutrition guidelines.;Long Term: Cholesterol controlled with medications as prescribed, with individualized exercise RX and with personalized nutrition plan. Value goals: LDL < 70mg , HDL > 40 mg.          Core Components/Risk Factors/Patient Goals Review:   Goals and Risk Factor Review     Row Name 04/11/24 1452 05/09/24 1444           Core Components/Risk Factors/Patient Goals Review   Personal Goals Review Diabetes;Lipids;Hypertension;Heart Failure Diabetes;Lipids;Hypertension;Heart Failure      Review Edric follows up with his doctors when needed and takes all of his medications as prescribed. Doral does not check his blood pressure or sugar at home. Grady follows up with his doctors when needed and takes all of his medications as prescribed. Ferris states he doesn't check his sugar or blood pressure at home.      Expected Outcomes Short: start to monitor blood sugar and blood pressure at home. Long: continue to follow up with MD and take medications as prescribed. Short: Continue to attend rehab.  Long: Continue to attend all MD appointments.         Core  Components/Risk Factors/Patient Goals at Discharge (Final Review):   Goals and Risk Factor Review - 05/09/24 1444       Core Components/Risk Factors/Patient Goals Review   Personal Goals Review Diabetes;Lipids;Hypertension;Heart Failure    Review Baylon follows up with his doctors when needed and takes all of his medications as prescribed. Krishon states he doesn't check his sugar or blood pressure at home.    Expected Outcomes Short: Continue to attend rehab.  Long: Continue to attend all MD appointments.          ITP Comments:  ITP Comments  Row Name 03/14/24 1413 03/21/24 1539 03/22/24 1011 03/30/24 1422 04/19/24 1408   ITP Comments Virtual orientation visit completed for cardiac rehab with status post coronary artery stent placement. On-site orientation visit scheduled for Monday 03/21/24. Documentation 1 pm. Patient attend orientation today.  Patient is attending Cardiac Rehabilitation Program.  Documentation for diagnosis can be found in [facility] 03/09/24.  Reviewed medical chart, RPE/RPD, gym safety, and program guidelines.  Patient was fitted to equipment they will be using during rehab.  Patient is scheduled to start exercise on Wednesday 03/30/24 at 1430.   Initial ITP created and sent for review and signature by Dr. Dorn Ross, Medical Director for Cardiac Rehabilitation Program. 30 day review completed. ITP sent to Dr. Dorn Ross, Medical Director of Cardiac Rehab. Continue with ITP unless changes are made by physician.  Only oriented yesterday First full day of exercise!  Patient was oriented to gym and equipment including functions, settings, policies, and procedures.  Patient's individual exercise prescription and treatment plan were reviewed.  All starting workloads were established based on the results of the 6 minute walk test done at initial orientation visit.  The plan for exercise progression was also introduced and progression will be customized based on patient's  performance and goals. 30 day review completed. ITP sent to Dr. Dorn Ross, Medical Director of Cardiac Rehab. Continue with ITP unless changes are made by physician.    Row Name 05/17/24 1036           ITP Comments 30 day review completed. ITP sent to Dr. Dorn Ross, Medical Director of Cardiac Rehab. Continue with ITP unless changes are made by physician.          Comments: 30 day review     [1]  Current Outpatient Medications:    aspirin  81 MG tablet, Take 81 mg by mouth every evening., Disp: , Rfl:    budesonide-glycopyrrolate-formoterol (BREZTRI  AEROSPHERE) 160-9-4.8 MCG/ACT AERO inhaler, Inhale 2 puffs into the lungs in the morning and at bedtime., Disp: 10.7 g, Rfl: 11   Cholecalciferol  (VITAMIN D3) 1000 UNITS CAPS, Take 1 capsule by mouth every evening., Disp: , Rfl:    clopidogrel  (PLAVIX ) 75 MG tablet, Take 1 tablet (75 mg total) by mouth daily with breakfast., Disp: 90 tablet, Rfl: 3   furosemide  (LASIX ) 20 MG tablet, TAKE 2 TABLETS BY MOUTH AS NEEDED FOR EDEMA (SWELLING) **THIS IS A DOSE INCREASE**, Disp: 60 tablet, Rfl: 6   glipiZIDE  (GLUCOTROL  XL) 2.5 MG 24 hr tablet, Take 2.5 mg by mouth daily., Disp: , Rfl:    ipratropium (ATROVENT ) 0.06 % nasal spray, Place 1 spray into both nostrils 3 (three) times daily., Disp: 15 mL, Rfl: 5   levothyroxine  (SYNTHROID ) 88 MCG tablet, Take 1 tablet (88 mcg total) by mouth daily., Disp: 90 tablet, Rfl: 3   loratadine (CLARITIN) 10 MG tablet, Take 10 mg by mouth daily as needed for allergies., Disp: , Rfl:    losartan  (COZAAR ) 25 MG tablet, Take 25 mg by mouth daily. , Disp: , Rfl:    meclizine  (ANTIVERT ) 25 MG tablet, Take 1 tablet (25 mg total) by mouth 3 (three) times daily as needed for dizziness. May cause drowsiness, Disp: 30 tablet, Rfl: 0   metFORMIN  (GLUCOPHAGE ) 500 MG tablet, Take 2 tablets (1,000 mg total) by mouth 2 (two) times daily., Disp: , Rfl:    metFORMIN  (GLUCOPHAGE ) 500 MG tablet, Take 2 tablets (1,000 mg  total) by mouth 2 (two) times daily for diabetes., Disp: 400 tablet, Rfl:  3   metoprolol  succinate (TOPROL -XL) 25 MG 24 hr tablet, Take 1 tablet by mouth once daily, Disp: 90 tablet, Rfl: 3   nitroGLYCERIN  (NITROSTAT ) 0.4 MG SL tablet, Place 1 tablet (0.4 mg total) under the tongue every 5 (five) minutes as needed., Disp: 25 tablet, Rfl: 3   Propylene Glycol (SYSTANE COMPLETE OP), Apply 1 drop to eye daily., Disp: , Rfl:    rOPINIRole (REQUIP) 0.5 MG tablet, Take by mouth., Disp: , Rfl:    rosuvastatin  (CRESTOR ) 20 MG tablet, Take 1 tablet (20 mg total) by mouth daily., Disp: 90 tablet, Rfl: 3   simvastatin  (ZOCOR ) 20 MG tablet, Take 1 tablet (20 mg total) by mouth daily for high cholesterol., Disp: 100 tablet, Rfl: 3   Turmeric 500 MG TABS, Take 500 mg by mouth every evening., Disp: , Rfl:    White Petrolatum-Mineral Oil (SYSTANE NIGHTTIME) OINT, Apply 1 Application to eye at bedtime as needed (dry eye)., Disp: , Rfl:    zinc gluconate 50 MG tablet, Take 50 mg by mouth daily., Disp: , Rfl:  [2]  Social History Tobacco Use  Smoking Status Former  Smokeless Tobacco Never  Tobacco Comments   quit 20-30 years ago

## 2024-05-18 ENCOUNTER — Encounter (HOSPITAL_COMMUNITY): Payer: Self-pay

## 2024-05-20 ENCOUNTER — Encounter (HOSPITAL_COMMUNITY): Payer: Self-pay

## 2024-05-23 ENCOUNTER — Encounter (HOSPITAL_COMMUNITY): Payer: Self-pay

## 2024-05-24 ENCOUNTER — Encounter (HOSPITAL_COMMUNITY): Payer: Self-pay

## 2024-05-24 ENCOUNTER — Other Ambulatory Visit (HOSPITAL_BASED_OUTPATIENT_CLINIC_OR_DEPARTMENT_OTHER): Payer: Self-pay

## 2024-05-25 ENCOUNTER — Ambulatory Visit: Admitting: Allergy & Immunology

## 2024-05-25 ENCOUNTER — Ambulatory Visit: Admitting: Internal Medicine

## 2024-05-25 ENCOUNTER — Ambulatory Visit (HOSPITAL_COMMUNITY): Admission: RE | Admit: 2024-05-25 | Discharge: 2024-05-25 | Attending: Internal Medicine | Admitting: Internal Medicine

## 2024-05-25 ENCOUNTER — Encounter: Payer: Self-pay | Admitting: Internal Medicine

## 2024-05-25 ENCOUNTER — Encounter: Payer: Self-pay | Admitting: Allergy & Immunology

## 2024-05-25 ENCOUNTER — Encounter (HOSPITAL_COMMUNITY): Payer: Self-pay

## 2024-05-25 VITALS — BP 128/68 | HR 92 | Temp 98.7°F

## 2024-05-25 VITALS — BP 156/78 | Ht 67.5 in | Wt 197.0 lb

## 2024-05-25 DIAGNOSIS — R0602 Shortness of breath: Secondary | ICD-10-CM | POA: Diagnosis not present

## 2024-05-25 DIAGNOSIS — R0609 Other forms of dyspnea: Secondary | ICD-10-CM

## 2024-05-25 DIAGNOSIS — Z87891 Personal history of nicotine dependence: Secondary | ICD-10-CM | POA: Diagnosis not present

## 2024-05-25 DIAGNOSIS — J310 Chronic rhinitis: Secondary | ICD-10-CM | POA: Insufficient documentation

## 2024-05-25 LAB — PULMONARY FUNCTION TEST
DL/VA % pred: 116 %
DL/VA: 4.49 ml/min/mmHg/L
DLCO unc % pred: 98 %
DLCO unc: 22.31 ml/min/mmHg
FEF 25-75 Post: 2.09 L/s
FEF 25-75 Pre: 1.48 L/s
FEF2575-%Change-Post: 41 %
FEF2575-%Pred-Post: 116 %
FEF2575-%Pred-Pre: 82 %
FEV1-%Change-Post: 7 %
FEV1-%Pred-Post: 93 %
FEV1-%Pred-Pre: 87 %
FEV1-Post: 2.46 L
FEV1-Pre: 2.29 L
FEV1FVC-%Change-Post: 2 %
FEV1FVC-%Pred-Pre: 100 %
FEV6-%Change-Post: 5 %
FEV6-%Pred-Post: 93 %
FEV6-%Pred-Pre: 88 %
FEV6-Post: 3.21 L
FEV6-Pre: 3.05 L
FEV6FVC-%Change-Post: 0 %
FEV6FVC-%Pred-Post: 103 %
FEV6FVC-%Pred-Pre: 102 %
FVC-%Change-Post: 4 %
FVC-%Pred-Post: 90 %
FVC-%Pred-Pre: 86 %
FVC-Post: 3.33 L
FVC-Pre: 3.19 L
Post FEV1/FVC ratio: 74 %
Post FEV6/FVC ratio: 97 %
Pre FEV1/FVC ratio: 72 %
Pre FEV6/FVC Ratio: 96 %
RV % pred: 107 %
RV: 2.74 L
TLC % pred: 89 %
TLC: 5.95 L

## 2024-05-25 MED ORDER — ALBUTEROL SULFATE HFA 108 (90 BASE) MCG/ACT IN AERS: INHALATION_SPRAY | RESPIRATORY_TRACT | 11 refills | Status: AC

## 2024-05-25 MED ORDER — ALBUTEROL SULFATE (2.5 MG/3ML) 0.083% IN NEBU
2.5000 mg | INHALATION_SOLUTION | Freq: Once | RESPIRATORY_TRACT | Status: AC
  Administered 2024-05-25: 2.5 mg via RESPIRATORY_TRACT

## 2024-05-25 NOTE — Assessment & Plan Note (Addendum)
 Quit smoking 1985  - PFTs /09/17/21 for doe with any exertion   minimal obst but 20 ml  better p saba  - 03/31/2024  After extensive coaching inhaler device,  effectiveness =    75% from a baseline of 25% hfa > try breztri  sample  - 03/31/2024   Walked on RA  x  3  lap(s) =  approx 450  ft  @ slow  pace, stopped due to knee pain  with lowest 02 sats 94%   - Allergy screen 03/31/2024 >  Eos 0.2 /  IgE  285 - PFT's  05/25/24  FEV1 2.46 (83 % ) ratio 0.74  p 8 % improvement from saba p 0 prior to study with DLCO  23 (98%)   and FV curve min concave   and no subjective improvement from saba or breztri    Rec >>>> leave off all rx except to saba prn :  Re SABA :  I spent extra time with pt today reviewing appropriate use of albuterol  for prn use on exertion with the following points: 1) saba is for relief of sob that does not improve by walking a slower pace or resting but rather if the pt does not improve after trying this first. 2) If the pt is convinced, as many are, that saba helps recover from activity faster then it's easy to tell if this is the case by re-challenging : ie stop, take the inhaler, then p 5 minutes try the exact same activity (intensity of workload) that just caused the symptoms and see if they are substantially diminished or not after saba 3) if there is an activity that reproducibly causes the symptoms, try the saba 15 min before the activity on alternate days   If in fact the saba really does help, then fine to continue to use it prn but advised may need to look closer at the maintenance regimen being used t(= zero for now) to achieve better control of airways disease with exertion.

## 2024-05-25 NOTE — Progress Notes (Signed)
 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 Gustatory rhinitis  - You definitely have symptoms of gustatory rhinitis (which is runny nose triggered by the smell of foods). - I am glad that the Atrovent  (ipratropium) is working so well. - Continue with the one spray per nostril three times daily, but AIM FOR THE EARS on each side.  - Your histamine was negative, so we will reschedule you for testing.  - I do not want to waste your time with the testing if it is not going to be positive at all.  - STOP the loratadine for THREE DAYS before the next appointment.    2. SOB (shortness of breath) - I am glad that the breathing is going well. - I am glad that Dr. Darlean has signed off.    3. Return in about 2 weeks (around 06/08/2024) for SKIN TESTING (1-55). You can have the follow up appointment with Dr. Iva or a Nurse Practicioner (our Nurse Practitioners are excellent and always have Physician oversight!).   Subjective:   Duane Stevens is a 79 y.o. male presenting today for follow up of No chief complaint on file.   Duane Stevens has a history of the following: Patient Active Problem List   Diagnosis Date Noted   Rhinitis, chronic 05/25/2024   DOE (dyspnea on exertion) 03/31/2024   Coronary artery disease involving native coronary artery of native heart with angina pectoris 03/09/2024   Hypothyroidism 02/16/2017   Type II or unspecified type diabetes mellitus without mention of complication, not stated as uncontrolled 10/15/2010   Pure hypercholesterolemia 10/15/2010   Osteoarthritis 10/15/2010   Multinodular goiter 10/15/2010    History obtained from: chart review and patient and his wife.  Discussed the use of AI scribe software for clinical note transcription with the patient and/or guardian, who gave verbal consent to  proceed.  Friend is a 79 y.o. male presenting for skin testing. He was last seen on January 21st. We could not do testing because his insurance company does not cover testing on the same day as a New Patient visit. He has been off of all antihistamines 3 days in anticipation of the testing.    At the last visit, we decided to do environmental allergy testing. We also recommended Atrveont to use three times daily before meals to help with the gustatory rhinitis.  Unfortunately, his histamine is non-reactive today.   He uses a nasal spray, which has caused epistaxis. It does help with his symptoms, however. He uses Claritin at night, which he took last night. He recently underwent a lung function test earlier today.  He has a history of knee problems, with two bad knees causing fatigue. He has received gel injections twice and cortisone injections two or three times in the past, which have provided some relief, although the last injection resulted in bleeding.  Otherwise, there have been no changes to his past medical history, surgical history, family history, or social history.    Review of systems otherwise negative other than that mentioned in the HPI.    Objective:   Blood pressure 128/68, pulse 92, temperature 98.7 F (37.1 C), SpO2 95%. There is no height or weight on file to calculate BMI.    Physical Exam Constitutional:      Appearance: He is well-developed.  HENT:     Head: Normocephalic and  atraumatic.     Right Ear: Tympanic membrane, ear canal and external ear normal.     Left Ear: Tympanic membrane, ear canal and external ear normal.     Nose: No nasal deformity, septal deviation, mucosal edema or rhinorrhea.     Right Nostril: Epistaxis present.     Left Nostril: Epistaxis present.     Right Turbinates: Not enlarged or swollen.     Left Turbinates: Not enlarged or swollen.     Right Sinus: No maxillary sinus tenderness or frontal sinus tenderness.     Left Sinus:  No maxillary sinus tenderness or frontal sinus tenderness.     Mouth/Throat:     Mouth: Mucous membranes are not pale and not dry.     Pharynx: Uvula midline.  Eyes:     General: Lids are normal. No allergic shiner.       Right eye: No discharge.        Left eye: No discharge.     Conjunctiva/sclera: Conjunctivae normal.     Right eye: Right conjunctiva is not injected. No chemosis.    Left eye: Left conjunctiva is not injected. No chemosis.    Pupils: Pupils are equal, round, and reactive to light.  Cardiovascular:     Rate and Rhythm: Normal rate and regular rhythm.     Heart sounds: Normal heart sounds.  Pulmonary:     Effort: Pulmonary effort is normal. No tachypnea, accessory muscle usage or respiratory distress.     Breath sounds: Normal breath sounds. No wheezing, rhonchi or rales.  Chest:     Chest wall: No tenderness.  Lymphadenopathy:     Cervical: No cervical adenopathy.  Skin:    Coloration: Skin is not pale.     Findings: No abrasion, erythema, petechiae or rash. Rash is not papular, urticarial or vesicular.  Neurological:     Mental Status: He is alert.  Psychiatric:        Behavior: Behavior is cooperative.      Diagnostic studies: histamine was non-reactive today (therefore we are going to reschedule the skin testing appointment)     Marty Shaggy, MD  [facility]        "

## 2024-05-25 NOTE — Progress Notes (Signed)
 "   Duane Stevens, male    DOB: 1945/04/23    MRN: [id number]   Brief patient profile:  51  yowm  with chronic sinusitis quit smoking around 1984 and quit Filbert Kuba exp 2007  referred to pulmonary clinic in [place]  03/31/2024 by Thom Ross  for doe x  2023  but knees are also limiting   - PFTs 09/17/21 for doe with any exertion   minimal obst but 190 ml  better p saba    History of Present Illness  03/31/2024  Pulmonary/ 1st office eval/ Benjerman Molinelli / [place] [facility]  Chief Complaint  Patient presents with   Establish Care    DOE   Dyspnea:  100 ft slt uphill cto shed where he has wood stove  Cough: none / some pnds all his life some better with clariton. Sleep: Bed blocks and one pillow  SABA use: none  02: none  Patient Instructions  Breztri  Take 2 puffs first thing in am  Work on inhaler technique If you notice improvement from breztri  we can call it in for you or wait until you return in 6 weeks to decide better choice if not much better Please schedule a follow up office visit in 6 weeks, call sooner if needed  PFT's on return   Allergy screen 03/31/2024 >  Eos 0.2 /  IgE  285    05/25/2024  f/u ov/ office/Niurka Benecke re: DOE with mod elevated  IgE maint on no pulmonary rx  Chief Complaint  Patient presents with   Shortness of Breath    F/u Pft @ 9  Overall symptoms better   Dyspnea:  limited by knees more than dyspnea and now riding scooter to go to shop  Cough: minimal mucoid s am or noct esac  Sleeping: bd blocks one pillow      SABA use: none - they didn't seem to help  02: none     No obvious day to day or daytime variability or assoc excess/ purulent sputum or mucus plugs or hemoptysis or cp or chest tightness, subjective wheeze or overt  hb symptoms.    Also denies any obvious fluctuation of symptoms with weather or environmental changes or other aggravating or alleviating factors except as outlined above   No unusual exposure hx or h/o childhood pna/  asthma or knowledge of premature birth.  Current Allergies, Complete Past Medical History, Past Surgical History, Family History, and Social History were reviewed in [name] record.  ROS  The following are not active complaints unless bolded Hoarseness, sore throat, dysphagia, dental problems, itching, sneezing,  nasal congestion or discharge of excess mucus or purulent secretions, ear ache,   fever, chills, sweats, unintended wt loss or wt gain, classically pleuritic or exertional cp,  orthopnea pnd or arm/hand swelling  or leg swelling, presyncope, palpitations, abdominal pain, anorexia, nausea, vomiting, diarrhea  or change in bowel habits or change in bladder habits, change in stools or change in urine, dysuria, hematuria,  rash, arthralgias, visual complaints, headache, numbness, weakness or ataxia or problems with walking or coordination,  change in mood or  memory.         Outpatient Medications Prior to Visit  Medication Sig Dispense Refill   aspirin  81 MG tablet Take 81 mg by mouth every evening.     budesonide-glycopyrrolate-formoterol (BREZTRI  AEROSPHERE) 160-9-4.8 MCG/ACT AERO inhaler Inhale 2 puffs into the lungs in the morning and at bedtime. 10.7 g 11   Cholecalciferol  (VITAMIN D3) 1000  UNITS CAPS Take 1 capsule by mouth every evening.     clopidogrel  (PLAVIX ) 75 MG tablet Take 1 tablet (75 mg total) by mouth daily with breakfast. 90 tablet 3   furosemide  (LASIX ) 20 MG tablet TAKE 2 TABLETS BY MOUTH AS NEEDED FOR EDEMA (SWELLING) **THIS IS A DOSE INCREASE** 60 tablet 6   glipiZIDE  (GLUCOTROL  XL) 2.5 MG 24 hr tablet Take 2.5 mg by mouth daily.     ipratropium (ATROVENT ) 0.06 % nasal spray Place 1 spray into both nostrils 3 (three) times daily. 15 mL 5   levothyroxine  (SYNTHROID ) 88 MCG tablet Take 1 tablet (88 mcg total) by mouth daily. 90 tablet 3   loratadine (CLARITIN) 10 MG tablet Take 10 mg by mouth daily as needed for allergies.     losartan  (COZAAR )  25 MG tablet Take 25 mg by mouth daily.      meclizine  (ANTIVERT ) 25 MG tablet Take 1 tablet (25 mg total) by mouth 3 (three) times daily as needed for dizziness. May cause drowsiness 30 tablet 0   metFORMIN  (GLUCOPHAGE ) 500 MG tablet Take 2 tablets (1,000 mg total) by mouth 2 (two) times daily.     metFORMIN  (GLUCOPHAGE ) 500 MG tablet Take 2 tablets (1,000 mg total) by mouth 2 (two) times daily for diabetes. 400 tablet 3   metoprolol  succinate (TOPROL -XL) 25 MG 24 hr tablet Take 1 tablet by mouth once daily 90 tablet 3   nitroGLYCERIN  (NITROSTAT ) 0.4 MG SL tablet Place 1 tablet (0.4 mg total) under the tongue every 5 (five) minutes as needed. 25 tablet 3   Propylene Glycol (SYSTANE COMPLETE OP) Apply 1 drop to eye daily.     rOPINIRole (REQUIP) 0.5 MG tablet Take by mouth.     rosuvastatin  (CRESTOR ) 20 MG tablet Take 1 tablet (20 mg total) by mouth daily. 90 tablet 3   simvastatin  (ZOCOR ) 20 MG tablet Take 1 tablet (20 mg total) by mouth daily for high cholesterol. 100 tablet 3   Turmeric 500 MG TABS Take 500 mg by mouth every evening.     White Petrolatum-Mineral Oil (SYSTANE NIGHTTIME) OINT Apply 1 Application to eye at bedtime as needed (dry eye).     zinc gluconate 50 MG tablet Take 50 mg by mouth daily.     No facility-administered medications prior to visit.         Past Medical History:  Diagnosis Date   Allergy    Arthritis    Diabetes mellitus    Hypercholesterolemia    Hypertension    Hypothyroidism    Prostate cancer (HCC)    hx of   Thyroid  disease       Objective:     Wt Readings from Last 3 Encounters:  05/25/24 197 lb (89.4 kg)  05/11/24 197 lb 9.6 oz (89.6 kg)  04/28/24 197 lb (89.4 kg)    Vital signs reviewed  05/25/2024  - Note at rest 02 sats  98% on RA   General appearance:    w/c bound elderly wm  nad    HEENT : Oropharynx  clear/edentulous          NECK :  without  apparent JVD/ palpable Nodes/TM/ minimal pseudowheeze resolves with PLB      LUNGS: no acc muscle use,  Nl contour chest which is clear to A and P bilaterally without cough on insp or exp maneuvers   CV:  RRR  no s3 or murmur or increase in P2, and trace edema both ankles  ABD:  soft and nontender   MS:    ext warm without deformities Or obvious joint restrictions  calf tenderness, cyanosis or clubbing    SKIN: warm and dry without lesions    NEURO:  alert, approp, nl sensorium with  no motor or cerebellar deficits apparent.     Assessment   Assessment & Plan DOE (dyspnea on exertion) Quit smoking 1985  - PFTs /09/17/21 for doe with any exertion   minimal obst but 20 ml  better p saba  - 03/31/2024  After extensive coaching inhaler device,  effectiveness =    75% from a baseline of 25% hfa > try breztri  sample  - 03/31/2024   Walked on RA  x  3  lap(s) =  approx 450  ft  @ slow  pace, stopped due to knee pain  with lowest 02 sats 94%   - Allergy screen 03/31/2024 >  Eos 0.2 /  IgE  285 - PFT's  05/25/24  FEV1 2.46 (83 % ) ratio 0.74  p 8 % improvement from saba p 0 prior to study with DLCO  23 (98%)   and FV curve min concave   and no subjective improvement from saba or breztri    Rec >>>> leave off all rx except to saba prn :  Re SABA :  I spent extra time with pt today reviewing appropriate use of albuterol  for prn use on exertion with the following points: 1) saba is for relief of sob that does not improve by walking a slower pace or resting but rather if the pt does not improve after trying this first. 2) If the pt is convinced, as many are, that saba helps recover from activity faster then it's easy to tell if this is the case by re-challenging : ie stop, take the inhaler, then p 5 minutes try the exact same activity (intensity of workload) that just caused the symptoms and see if they are substantially diminished or not after saba 3) if there is an activity that reproducibly causes the symptoms, try the saba 15 min before the activity on alternate  days   If in fact the saba really does help, then fine to continue to use it prn but advised may need to look closer at the maintenance regimen being used t(= zero for now) to achieve better control of airways disease with exertion.  Rhinitis, chronic Allergy screen 03/31/2024 >  Eos 0.2 /  IgE  285> referred to [facility] allergy   Pulmonary f/u can be prn     Each maintenance medication was reviewed in detail including emphasizing most importantly the difference between maintenance and prns and under what circumstances the prns are to be triggered using an action plan format where appropriate.  Total time for H and P, chart review, counseling, reviewing hfa  device(s) and generating customized AVS unique to this office visit / same day charting = 32 min sum         AVS  Patient Instructions  Use your albuterol  as a rescue medication to be used if you can't catch your breath by resting, slowing your pace,  or doing a relaxed purse lip breathing pattern.  - The less you use it, the better it will work when you need it. - Ok to use up to 2 puffs  every 4 hours if you must but call for  appointment if use goes up over your usual need - Don't leave home without it !!  (think  of it like a spare tire or starter fluid for your car)   Also  Ok to try albuterol  15 min before an activity (on alternating days)  that you know would usually make you short of breath and see if it makes any difference and if makes none then don't take albuterol  after activity unless you can't catch your breath as this means it's the resting that helps, not the albuterol .      Pulmonary follow up is as needed    Ozell America, MD 05/25/2024          "

## 2024-05-25 NOTE — Patient Instructions (Addendum)
 1. Gustatory rhinitis  - You definitely have symptoms of gustatory rhinitis (which is runny nose triggered by the smell of foods). - I am glad that the Atrovent  (ipratropium) is working so well. - Continue with the one spray per nostril three times daily, but AIM FOR THE EARS on each side.  - Your histamine was negative, so we will reschedule you for testing.  - I do not want to waste your time with the testing if it is not going to be positive at all.  - STOP the loratadine for THREE DAYS before the next appointment.   2. SOB (shortness of breath) - I am glad that the breathing is going well. - I am glad that Dr. Darlean has signed off.   3. Return in about 2 weeks (around 06/08/2024) for SKIN TESTING (1-55). You can have the follow up appointment with Dr. Iva or a Nurse Practicioner (our Nurse Practitioners are excellent and always have Physician oversight!).    Please inform us  of any Emergency Department visits, hospitalizations, or changes in symptoms. Call us  before going to the ED for breathing or allergy symptoms since we might be able to fit you in for a sick visit. Feel free to contact us  anytime with any questions, problems, or concerns.  It was a pleasure to see you and your family again today!  Websites that have reliable patient information: 1. American Academy of Asthma, Allergy, and Immunology: www.aaaai.org 2. Food Allergy Research and Education (FARE): foodallergy.org 3. Mothers of Asthmatics: http://www.asthmacommunitynetwork.org 4. American College of Allergy, Asthma, and Immunology: www.acaai.org      Like us  on [employer] and Instagram for our latest updates!      A healthy democracy works best when [employer] participate! Make sure you are registered to vote! If you have moved or changed any of your contact information, you will need to get this updated before voting! Scan the QR codes below to learn more!

## 2024-05-25 NOTE — Patient Instructions (Signed)
 Use your albuterol  as a rescue medication to be used if you can't catch your breath by resting, slowing your pace,  or doing a relaxed purse lip breathing pattern.  - The less you use it, the better it will work when you need it. - Ok to use up to 2 puffs  every 4 hours if you must but call for  appointment if use goes up over your usual need - Don't leave home without it !!  (think of it like a spare tire or starter fluid for your car)  Also  Ok to try albuterol  15 min before an activity (on alternating days)  that you know would usually make you short of breath and see if it makes any difference and if makes none then don't take albuterol  after activity unless you can't catch your breath as this means it's the resting that helps, not the albuterol .   Pulmonary follow up is as needed

## 2024-05-25 NOTE — Patient Instructions (Signed)
 1. Gustatory rhinitis  - You definitely have symptoms of gustatory rhinitis (which is runny nose triggered by the smell of foods). - I am glad that the Atrovent  (ipratropium) is working so well. - Continue with the one spray per nostril three times daily, but AIM FOR THE EARS on each side.  - Your histamine was negative, so we will reschedule you for testing.  - I do not want to waste your time with the testing if it is not going to be positive at all.  - STOP the loratadine for THREE DAYS before the next appointment.    2. SOB (shortness of breath) - I am glad that the breathing is going well. - I am glad that Dr. Darlean has signed off.    3. Return in about 2 weeks (around 06/08/2024) for SKIN TESTING (1-55). You can have the follow up appointment with Dr. Iva or a Nurse Practicioner (our Nurse Practitioners are excellent and always have Physician oversight!).

## 2024-05-25 NOTE — Progress Notes (Unsigned)
 "  FOLLOW UP  Date of Service/Encounter:  05/25/24   Assessment:   Gustatory rhinitis - planning for skin testing at the next visit   SOB (shortness of breath)  Plan/Recommendations:   Patient Instructions  1. Gustatory rhinitis  - You definitely have symptoms of gustatory rhinitis (which is runny nose triggered by the smell of foods). - I am glad that the Atrovent  (ipratropium) is working so well. - Continue with the one spray per nostril three times daily, but AIM FOR THE EARS on each side.  - Your histamine was negative, so we will reschedule you for testing.  - I do not want to waste your time with the testing if it is not going to be positive at all.  - STOP the loratadine for THREE DAYS before the next appointment.   2. SOB (shortness of breath) - I am glad that the breathing is going well. - I am glad that Dr. Darlean has signed off.   3. Return in about 2 weeks (around 06/08/2024) for SKIN TESTING (1-55). You can have the follow up appointment with Dr. Iva or a Nurse Practicioner (our Nurse Practitioners are excellent and always have Physician oversight!).    Please inform us  of any Emergency Department visits, hospitalizations, or changes in symptoms. Call us  before going to the ED for breathing or allergy symptoms since we might be able to fit you in for a sick visit. Feel free to contact us  anytime with any questions, problems, or concerns.  It was a pleasure to see you and your family again today!  Websites that have reliable patient information: 1. American Academy of Asthma, Allergy, and Immunology: www.aaaai.org 2. Food Allergy Research and Education (FARE): foodallergy.org 3. Mothers of Asthmatics: http://www.asthmacommunitynetwork.org 4. American College of Allergy, Asthma, and Immunology: www.acaai.org      Like us  on [employer] and Instagram for our latest updates!      A healthy democracy works best when [employer] participate! Make sure you are  registered to vote! If you have moved or changed any of your contact information, you will need to get this updated before voting! Scan the QR codes below to learn more!             Subjective:   Duane Stevens is a 79 y.o. male presenting today for follow up of No chief complaint on file.   Duane Stevens has a history of the following: Patient Active Problem List   Diagnosis Date Noted   Rhinitis, chronic 05/25/2024   DOE (dyspnea on exertion) 03/31/2024   Coronary artery disease involving native coronary artery of native heart with angina pectoris 03/09/2024   Hypothyroidism 02/16/2017   Type II or unspecified type diabetes mellitus without mention of complication, not stated as uncontrolled 10/15/2010   Pure hypercholesterolemia 10/15/2010   Osteoarthritis 10/15/2010   Multinodular goiter 10/15/2010    History obtained from: chart review and patient.  Discussed the use of AI scribe software for clinical note transcription with the patient and/or guardian, who gave verbal consent to proceed.  Duane Stevens is a 79 y.o. male presenting for skin testing. He was last seen on January 21st. We could not do testing because his insurance company does not cover testing on the same day as a New Patient visit. He has been off of all antihistamines 3 days in anticipation of the testing.   At the last visit, we decided to do environmental allergy testing. We also recommended Atrveont to use three times daily before  meals to help with the gustatory rhinitis.   Otherwise, there have been no changes to his past medical history, surgical history, family history, or social history.    Review of systems otherwise negative other than that mentioned in the HPI.    Objective:   There were no vitals taken for this visit. There is no height or weight on file to calculate BMI.    Physical exam deferred since this was a skin testing appointment only.   Diagnostic studies: {Blank  single:19197::none,deferred due to recent antihistamine use,labs sent instead, }  Spirometry: {Blank single:19197::results normal (FEV1: ***%, FVC: ***%, FEV1/FVC: ***%),results abnormal (FEV1: ***%, FVC: ***%, FEV1/FVC: ***%)}.    {Blank single:19197::Spirometry consistent with mild obstructive disease,Spirometry consistent with moderate obstructive disease,Spirometry consistent with severe obstructive disease,Spirometry consistent with possible restrictive disease,Spirometry consistent with mixed obstructive and restrictive disease,Spirometry uninterpretable due to technique,Spirometry consistent with normal pattern}. {Blank single:19197::Albuterol /Atrovent  nebulizer,Xopenex/Atrovent  nebulizer,Albuterol  nebulizer,Albuterol  four puffs via MDI,Xopenex four puffs via MDI} treatment given in clinic with {Blank single:19197::significant improvement in FEV1 per ATS criteria,significant improvement in FVC per ATS criteria,significant improvement in FEV1 and FVC per ATS criteria,improvement in FEV1, but not significant per ATS criteria,improvement in FVC, but not significant per ATS criteria,improvement in FEV1 and FVC, but not significant per ATS criteria,no improvement}.  Allergy Studies: {Blank single:19197::none,labs sent instead, }    {Blank single:19197::Allergy testing results were read and interpreted by myself, documented by clinical staff., }      Marty Shaggy, MD  [facility]        "

## 2024-05-25 NOTE — Assessment & Plan Note (Addendum)
 Allergy screen 03/31/2024 >  Eos 0.2 /  IgE  285> referred to [facility] allergy   Pulmonary f/u can be prn     Each maintenance medication was reviewed in detail including emphasizing most importantly the difference between maintenance and prns and under what circumstances the prns are to be triggered using an action plan format where appropriate.  Total time for H and P, chart review, counseling, reviewing hfa  device(s) and generating customized AVS unique to this office visit / same day charting = 32 min sum

## 2024-05-27 ENCOUNTER — Encounter (HOSPITAL_COMMUNITY): Payer: Self-pay

## 2024-05-30 ENCOUNTER — Encounter (HOSPITAL_COMMUNITY): Payer: Self-pay

## 2024-06-01 ENCOUNTER — Encounter (HOSPITAL_COMMUNITY): Payer: Self-pay

## 2024-06-03 ENCOUNTER — Encounter (HOSPITAL_COMMUNITY): Payer: Self-pay

## 2024-06-06 ENCOUNTER — Encounter (HOSPITAL_COMMUNITY): Payer: Self-pay

## 2024-06-08 ENCOUNTER — Encounter (HOSPITAL_COMMUNITY): Payer: Self-pay

## 2024-06-10 ENCOUNTER — Encounter (HOSPITAL_COMMUNITY): Payer: Self-pay

## 2024-06-13 ENCOUNTER — Encounter (HOSPITAL_COMMUNITY): Payer: Self-pay

## 2024-06-14 ENCOUNTER — Ambulatory Visit: Admitting: Internal Medicine

## 2024-06-15 ENCOUNTER — Encounter (HOSPITAL_COMMUNITY): Payer: Self-pay

## 2024-06-15 ENCOUNTER — Ambulatory Visit: Admitting: Allergy & Immunology

## 2024-06-17 ENCOUNTER — Encounter (HOSPITAL_COMMUNITY): Payer: Self-pay

## 2024-06-20 ENCOUNTER — Encounter (HOSPITAL_COMMUNITY): Payer: Self-pay

## 2024-06-22 ENCOUNTER — Encounter (HOSPITAL_COMMUNITY): Payer: Self-pay

## 2024-06-24 ENCOUNTER — Encounter (HOSPITAL_COMMUNITY): Payer: Self-pay

## 2024-06-27 ENCOUNTER — Encounter (HOSPITAL_COMMUNITY): Payer: Self-pay

## 2024-06-29 ENCOUNTER — Encounter (HOSPITAL_COMMUNITY): Payer: Self-pay

## 2024-07-01 ENCOUNTER — Encounter (HOSPITAL_COMMUNITY): Payer: Self-pay

## 2024-07-04 ENCOUNTER — Encounter (HOSPITAL_COMMUNITY): Payer: Self-pay

## 2024-07-06 ENCOUNTER — Encounter (HOSPITAL_COMMUNITY): Payer: Self-pay

## 2024-07-08 ENCOUNTER — Encounter (HOSPITAL_COMMUNITY): Payer: Self-pay

## 2024-07-11 ENCOUNTER — Encounter (HOSPITAL_COMMUNITY): Payer: Self-pay

## 2024-07-11 ENCOUNTER — Encounter (INDEPENDENT_AMBULATORY_CARE_PROVIDER_SITE_OTHER): Admitting: Ophthalmology

## 2024-07-13 ENCOUNTER — Encounter (HOSPITAL_COMMUNITY): Payer: Self-pay

## 2024-07-15 ENCOUNTER — Encounter (HOSPITAL_COMMUNITY): Payer: Self-pay

## 2024-07-18 ENCOUNTER — Encounter (HOSPITAL_COMMUNITY): Payer: Self-pay

## 2024-07-20 ENCOUNTER — Encounter (HOSPITAL_COMMUNITY): Payer: Self-pay

## 2024-07-26 ENCOUNTER — Ambulatory Visit: Admitting: Nurse Practitioner
